# Patient Record
Sex: Female | Born: 1980 | Race: Black or African American | Hispanic: No | Marital: Single | Smoking: Current every day smoker
Health system: Southern US, Community
[De-identification: ages and names within clinical notes are randomized; demographics above are authoritative.]

## PROBLEM LIST (undated history)

## (undated) DIAGNOSIS — I10 Essential (primary) hypertension: Secondary | ICD-10-CM

## (undated) DIAGNOSIS — E785 Hyperlipidemia, unspecified: Secondary | ICD-10-CM

## (undated) DIAGNOSIS — E119 Type 2 diabetes mellitus without complications: Secondary | ICD-10-CM

## (undated) HISTORY — DX: Hyperlipidemia, unspecified: E78.5

---

## 2005-11-26 ENCOUNTER — Emergency Department (HOSPITAL_COMMUNITY): Admission: EM | Admit: 2005-11-26 | Discharge: 2005-11-26 | Payer: Self-pay | Admitting: Emergency Medicine

## 2007-01-06 ENCOUNTER — Emergency Department (HOSPITAL_COMMUNITY): Admission: EM | Admit: 2007-01-06 | Discharge: 2007-01-06 | Payer: Self-pay | Admitting: Emergency Medicine

## 2007-09-21 ENCOUNTER — Ambulatory Visit (HOSPITAL_COMMUNITY): Admission: RE | Admit: 2007-09-21 | Discharge: 2007-09-21 | Payer: Self-pay | Admitting: Obstetrics

## 2008-02-05 ENCOUNTER — Inpatient Hospital Stay (HOSPITAL_COMMUNITY): Admission: AD | Admit: 2008-02-05 | Discharge: 2008-02-05 | Payer: Self-pay | Admitting: Obstetrics & Gynecology

## 2008-02-09 ENCOUNTER — Inpatient Hospital Stay (HOSPITAL_COMMUNITY): Admission: AD | Admit: 2008-02-09 | Discharge: 2008-02-11 | Payer: Self-pay | Admitting: Obstetrics & Gynecology

## 2009-02-24 ENCOUNTER — Ambulatory Visit: Payer: Self-pay | Admitting: Advanced Practice Midwife

## 2009-02-24 ENCOUNTER — Inpatient Hospital Stay (HOSPITAL_COMMUNITY): Admission: AD | Admit: 2009-02-24 | Discharge: 2009-02-26 | Payer: Self-pay | Admitting: Obstetrics & Gynecology

## 2009-08-15 ENCOUNTER — Inpatient Hospital Stay (HOSPITAL_COMMUNITY): Admission: AD | Admit: 2009-08-15 | Discharge: 2009-08-15 | Payer: Self-pay | Admitting: Obstetrics & Gynecology

## 2009-12-23 ENCOUNTER — Other Ambulatory Visit: Admission: RE | Admit: 2009-12-23 | Discharge: 2009-12-23 | Payer: Self-pay | Admitting: Obstetrics and Gynecology

## 2010-01-12 ENCOUNTER — Inpatient Hospital Stay (HOSPITAL_COMMUNITY): Admission: AD | Admit: 2010-01-12 | Discharge: 2010-01-14 | Payer: Self-pay | Admitting: Obstetrics & Gynecology

## 2010-01-12 ENCOUNTER — Ambulatory Visit: Payer: Self-pay | Admitting: Advanced Practice Midwife

## 2010-06-22 ENCOUNTER — Encounter: Payer: Self-pay | Admitting: Obstetrics

## 2010-08-15 LAB — CBC
HCT: 32.5 % — ABNORMAL LOW (ref 36.0–46.0)
Hemoglobin: 10.6 g/dL — ABNORMAL LOW (ref 12.0–15.0)
MCH: 28.7 pg (ref 26.0–34.0)
MCHC: 32.6 g/dL (ref 30.0–36.0)
MCV: 87.9 fL (ref 78.0–100.0)
Platelets: 220 10*3/uL (ref 150–400)
RBC: 3.69 MIL/uL — ABNORMAL LOW (ref 3.87–5.11)

## 2010-08-15 LAB — RAPID URINE DRUG SCREEN, HOSP PERFORMED
Amphetamines: NOT DETECTED
Benzodiazepines: NOT DETECTED
Cocaine: NOT DETECTED
Tetrahydrocannabinol: NOT DETECTED

## 2010-08-24 LAB — COMPREHENSIVE METABOLIC PANEL
CO2: 26 mEq/L (ref 19–32)
Calcium: 9.3 mg/dL (ref 8.4–10.5)
Chloride: 103 mEq/L (ref 96–112)
Glucose, Bld: 116 mg/dL — ABNORMAL HIGH (ref 70–99)
Sodium: 136 mEq/L (ref 135–145)
Total Protein: 6.4 g/dL (ref 6.0–8.3)

## 2010-08-24 LAB — WET PREP, GENITAL
Clue Cells Wet Prep HPF POC: NONE SEEN
Trich, Wet Prep: NONE SEEN
Yeast Wet Prep HPF POC: NONE SEEN

## 2010-08-24 LAB — URINALYSIS, ROUTINE W REFLEX MICROSCOPIC
Bilirubin Urine: NEGATIVE
Specific Gravity, Urine: 1.01 (ref 1.005–1.030)
pH: 7 (ref 5.0–8.0)

## 2010-08-24 LAB — GC/CHLAMYDIA PROBE AMP, GENITAL
Chlamydia, DNA Probe: NEGATIVE
GC Probe Amp, Genital: NEGATIVE

## 2010-08-24 LAB — URINE MICROSCOPIC-ADD ON

## 2010-08-24 LAB — CBC
Hemoglobin: 11.9 g/dL — ABNORMAL LOW (ref 12.0–15.0)
RBC: 4.03 MIL/uL (ref 3.87–5.11)

## 2010-08-24 LAB — POCT PREGNANCY, URINE: Preg Test, Ur: POSITIVE

## 2010-09-05 LAB — COMPREHENSIVE METABOLIC PANEL
Albumin: 2.7 g/dL — ABNORMAL LOW (ref 3.5–5.2)
CO2: 22 mEq/L (ref 19–32)
Calcium: 8.7 mg/dL (ref 8.4–10.5)
Chloride: 108 mEq/L (ref 96–112)
Total Bilirubin: 0.8 mg/dL (ref 0.3–1.2)
Total Protein: 6.4 g/dL (ref 6.0–8.3)

## 2010-09-05 LAB — CBC
HCT: 35 % — ABNORMAL LOW (ref 36.0–46.0)
Hemoglobin: 11.6 g/dL — ABNORMAL LOW (ref 12.0–15.0)
MCHC: 33.1 g/dL (ref 30.0–36.0)
MCV: 88.2 fL (ref 78.0–100.0)
Platelets: 232 10*3/uL (ref 150–400)
RBC: 3.97 MIL/uL (ref 3.87–5.11)
RDW: 16.1 % — ABNORMAL HIGH (ref 11.5–15.5)
WBC: 14.4 10*3/uL — ABNORMAL HIGH (ref 4.0–10.5)

## 2010-09-05 LAB — URIC ACID: Uric Acid, Serum: 4.5 mg/dL (ref 2.4–7.0)

## 2010-09-05 LAB — SYPHILIS: RPR W/REFLEX TO RPR TITER AND TREPONEMAL ANTIBODIES, TRADITIONAL SCREENING AND DIAGNOSIS ALGORITHM: RPR Ser Ql: NONREACTIVE

## 2011-03-04 LAB — CBC
HCT: 34.2 — ABNORMAL LOW
MCHC: 32.2
MCHC: 32.3
MCV: 85
Platelets: 284
RBC: 3.87
RBC: 4.01
RDW: 15.7 — ABNORMAL HIGH
WBC: 11.2 — ABNORMAL HIGH

## 2011-03-04 LAB — URINALYSIS, ROUTINE W REFLEX MICROSCOPIC
Bilirubin Urine: NEGATIVE
Glucose, UA: NEGATIVE
Hgb urine dipstick: NEGATIVE
Specific Gravity, Urine: <1.005 — ABNORMAL LOW
Urobilinogen, UA: 0.2
pH: 5.5

## 2011-03-04 LAB — COMPREHENSIVE METABOLIC PANEL
Albumin: 2.6 — ABNORMAL LOW
Alkaline Phosphatase: 103
BUN: 4 — ABNORMAL LOW
Potassium: 3.6
Sodium: 137
Total Protein: 6.2

## 2011-03-04 LAB — URINE MICROSCOPIC-ADD ON: RBC / HPF: NONE SEEN

## 2011-03-04 LAB — RPR: RPR Ser Ql: NONREACTIVE

## 2011-08-10 ENCOUNTER — Emergency Department (HOSPITAL_COMMUNITY)
Admission: EM | Admit: 2011-08-10 | Discharge: 2011-08-11 | Disposition: A | Discharge status: Home / Self Care | Payer: Self-pay | Attending: Emergency Medicine | Admitting: Emergency Medicine

## 2011-08-10 ENCOUNTER — Encounter (HOSPITAL_COMMUNITY): Payer: Self-pay | Admitting: *Deleted

## 2011-08-10 DIAGNOSIS — F17200 Nicotine dependence, unspecified, uncomplicated: Secondary | ICD-10-CM | POA: Insufficient documentation

## 2011-08-10 DIAGNOSIS — I517 Cardiomegaly: Secondary | ICD-10-CM | POA: Insufficient documentation

## 2011-08-10 DIAGNOSIS — J111 Influenza due to unidentified influenza virus with other respiratory manifestations: Secondary | ICD-10-CM | POA: Insufficient documentation

## 2011-08-10 MED ORDER — ACETAMINOPHEN 325 MG PO TABS
650.0000 mg | ORAL_TABLET | Freq: Once | ORAL | Status: AC
  Administered 2011-08-10: 650 mg via ORAL
  Filled 2011-08-10: qty 2

## 2011-08-10 NOTE — ED Notes (Signed)
The pt has had a cold  Coughing temp and aching all over since yesterday.  No med taken for temp

## 2011-08-11 ENCOUNTER — Emergency Department (HOSPITAL_COMMUNITY): Payer: Self-pay

## 2011-08-11 MED ORDER — AZITHROMYCIN 250 MG PO TABS: ORAL_TABLET | ORAL | Status: AC

## 2011-08-11 NOTE — ED Provider Notes (Signed)
History     CSN: [id number]  Arrival date & time 08/10/11  1943   First MD Initiated Contact with Patient 08/11/11 0005      Chief Complaint  Patient presents with  . Influenza     HPI  History provided by the patient. Patient is a 31 year old female with no significant past medical history who presents with complaints of acute onset fever, chills, bodyaches and cough that began yesterday. Patient reports increased fatigue all day long. Patient did not take any medications to treat her symptoms. She denies any associated vomiting or diarrhea. Concerns are described as moderate. She denies any aggravating or alleviating factors. Patient is unsure of any known sick contacts. She has not had any recent travel. Patient denies any dysuria, urinary frequency or hematuria.    History reviewed. No pertinent past medical history.  History reviewed. No pertinent past surgical history.  History reviewed. No pertinent family history.  History  Substance Use Topics  . Smoking status: Current Everyday Smoker  . Smokeless tobacco: Not on file  . Alcohol Use: No    OB History    Grav Para Term Preterm Abortions TAB SAB Ect Mult Living                  Review of Systems  Constitutional: Positive for fever, chills, appetite change and fatigue.  HENT: Positive for rhinorrhea. Negative for ear pain, congestion and sore throat.   Respiratory: Positive for cough.   Genitourinary: Negative for dysuria, frequency, hematuria, flank pain, vaginal bleeding and vaginal discharge.  Musculoskeletal: Positive for myalgias.  All other systems reviewed and are negative.    Allergies  Review of patient's allergies indicates no known allergies.  Home Medications  No current outpatient prescriptions on file.  BP 143/95  Pulse 107  Temp(Src) 98.7 F (37.1 C) (Oral)  Resp 20  SpO2 99%  LMP 07/27/2011  Physical Exam  Nursing note and vitals reviewed. Constitutional: She is oriented to  person, place, and time. She appears well-developed and well-nourished. No distress.  HENT:  Head: Normocephalic and atraumatic.  Right Ear: Tympanic membrane normal.  Left Ear: Tympanic membrane normal.  Mouth/Throat: Oropharynx is clear and moist.  Neck: Normal range of motion. Neck supple.       No meningeal signs  Cardiovascular: Normal rate and regular rhythm.   Pulmonary/Chest: Effort normal and breath sounds normal. No respiratory distress. She has no wheezes. She has no rales.  Abdominal: Soft. She exhibits no distension. There is no tenderness. There is no rebound and no guarding.  Lymphadenopathy:    She has no cervical adenopathy.  Neurological: She is alert and oriented to person, place, and time.  Skin: Skin is warm and dry. No rash noted.  Psychiatric: She has a normal mood and affect. Her behavior is normal.    ED Course  Procedures   Dg Chest 2 View  08/11/2011  *RADIOLOGY REPORT*  Clinical Data: Influenza  CHEST - 2 VIEW  Comparison: None.  Findings: The heart is mildly enlarged.  Lungs are clear.  No pneumothorax  IMPRESSION: Cardiomegaly without edema.  Original Report Authenticated By: Donavan Burnet, M.D.     1. Influenza       MDM  12:30AM patient seen and evaluated. Patient in no acute distress. Patient febrile with slight tachycardia.  Chest x-ray normal. Patient temperature improved after medications. Patient reports feeling better with no bodyaches at this time. She still has slight dry cough. Patient with no other  concerning symptoms and normal exam. Suspect viral infection.      Angus Seller, [place] 08/12/11 [phone]

## 2011-08-11 NOTE — Discharge Instructions (Signed)
Your chest x-ray today has not shown any concerning signs for pneumonia infection. You begin a prescription for medications to help with your infection and symptoms. Please take this as prescribed for the full length of time. Please followup with the primary care provider.  Cough, Adult  A cough is a reflex that helps clear your throat and airways. It can help heal the body or may be a reaction to an irritated airway. A cough may only last 2 or 3 weeks (acute) or may last more than 8 weeks (chronic).  CAUSES Acute cough:  Viral or bacterial infections.  Chronic cough:  Infections.   Allergies.   Asthma.   Post-nasal drip.   Smoking.   Heartburn or acid reflux.   Some medicines.   Chronic lung problems (COPD).   Cancer.  SYMPTOMS   Cough.   Fever.   Chest pain.   Increased breathing rate.   High-pitched whistling sound when breathing (wheezing).   Colored mucus that you cough up (sputum).  TREATMENT   A bacterial cough may be treated with antibiotic medicine.   A viral cough must run its course and will not respond to antibiotics.   Your caregiver may recommend other treatments if you have a chronic cough.  HOME CARE INSTRUCTIONS   Only take over-the-counter or prescription medicines for pain, discomfort, or fever as directed by your caregiver. Use cough suppressants only as directed by your caregiver.   Use a cold steam vaporizer or humidifier in your bedroom or home to help loosen secretions.   Sleep in a semi-upright position if your cough is worse at night.   Rest as needed.   Stop smoking if you smoke.  SEEK IMMEDIATE MEDICAL CARE IF:   You have pus in your sputum.   Your cough starts to worsen.   You cannot control your cough with suppressants and are losing sleep.   You begin coughing up blood.   You have difficulty breathing.   You develop pain which is getting worse or is uncontrolled with medicine.   You have a fever.  MAKE SURE YOU:     Understand these instructions.   Will watch your condition.   Will get help right away if you are not doing well or get worse.  Document Released: 11/14/2010 Document Revised: 05/07/2011 Document Reviewed: 11/14/2010 [facility] Patient Information 2012 [place], [place].     Fever  Fever is a higher-than-normal body temperature. A normal temperature varies with:  Age.   How it is measured (mouth, underarm, rectal, or ear).   Time of day.  In an adult, an oral temperature around 98.6 Fahrenheit (F) or 37 Celsius (C) is considered normal. A rise in temperature of about 1.8 F or 1 C is generally considered a fever (100.4 F or 38 C). In an infant age 49 days or less, a rectal temperature of 100.4 F (38 C) generally is regarded as fever. Fever is not a disease but can be a symptom of illness. CAUSES   Fever is most commonly caused by infection.   Some non-infectious problems can cause fever. For example:   Some arthritis problems.   Problems with the thyroid or adrenal glands.   Immune system problems.   Some kinds of cancer.   A reaction to certain medicines.   Occasionally, the source of a fever cannot be determined. This is sometimes called a "Fever of Unknown Origin" (FUO).   Some situations may lead to a temporary rise in body temperature that  may go away on its own. Examples are:   Childbirth.   Surgery.   Some situations may cause a rise in body temperature but these are not considered "true fever". Examples are:   Intense exercise.   Dehydration.   Exposure to high outside or room temperatures.  SYMPTOMS   Feeling warm or hot.   Fatigue or feeling exhausted.   Aching all over.   Chills.   Shivering.   Sweats.  DIAGNOSIS  A fever can be suspected by your caregiver feeling that your skin is unusually warm. The fever is confirmed by taking a temperature with a thermometer. Temperatures can be taken different ways. Some methods are accurate and  some are not: With adults, adolescents, and children:   An oral temperature is used most commonly.   An ear thermometer will only be accurate if it is positioned as recommended by the manufacturer.   Under the arm temperatures are not accurate and not recommended.   Most electronic thermometers are fast and accurate.  Infants and Toddlers:  Rectal temperatures are recommended and most accurate.   Ear temperatures are not accurate in this age group and are not recommended.   Skin thermometers are not accurate.  RISKS AND COMPLICATIONS   During a fever, the body uses more oxygen, so a person with a fever may develop rapid breathing or shortness of breath. This can be dangerous especially in people with heart or lung disease.   The sweats that occur following a fever can cause dehydration.   High fever can cause seizures in infants and children.   Older persons can develop confusion during a fever.  TREATMENT   Medications may be used to control temperature.   Do not give aspirin to children with fevers. There is an association with Reye's syndrome. Reye's syndrome is a rare but potentially deadly disease.   If an infection is present and medications have been prescribed, take them as directed. Finish the full course of medications until they are gone.   Sponging or bathing with room-temperature water may help reduce body temperature. Do not use ice water or alcohol sponge baths.   Do not over-bundle children in blankets or heavy clothes.   Drinking adequate fluids during an illness with fever is important to prevent dehydration.  HOME CARE INSTRUCTIONS   For adults, rest and adequate fluid intake are important. Dress according to how you feel, but do not over-bundle.   Drink enough water and/or fluids to keep your urine clear or pale yellow.   For infants over 3 months and children, giving medication as directed by your caregiver to control fever can help with comfort. The  amount to be given is based on the child's weight. Do NOT give more than is recommended.  SEEK MEDICAL CARE IF:   You or your child are unable to keep fluids down.   Vomiting or diarrhea develops.   You develop a skin rash.   An oral temperature above 102 F (38.9 C) develops, or a fever which persists for over 3 days.   You develop excessive weakness, dizziness, fainting or extreme thirst.   Fevers keep coming back after 3 days.  SEEK IMMEDIATE MEDICAL CARE IF:   Shortness of breath or trouble breathing develops   You pass out.   You feel you are making little or no urine.   New pain develops that was not there before (such as in the head, neck, chest, back, or abdomen).   You cannot  hold down fluids.   Vomiting and diarrhea persist for more than a day or two.   You develop a stiff neck and/or your eyes become sensitive to light.   An unexplained temperature above 102 F (38.9 C) develops.  Document Released: 05/18/2005 Document Revised: 05/07/2011 Document Reviewed: 05/03/2008 [facility] Patient Information 2012 [place], [place].

## 2011-08-12 NOTE — ED Provider Notes (Signed)
Medical screening examination/treatment/procedure(s) were performed by non-physician practitioner and as supervising physician I was immediately available for consultation/collaboration.  Brannan Cassedy, MD 08/12/11 0734 

## 2013-03-16 ENCOUNTER — Emergency Department (HOSPITAL_COMMUNITY)
Admission: EM | Admit: 2013-03-16 | Discharge: 2013-03-16 | Disposition: A | Discharge status: Home / Self Care | Payer: Self-pay | Attending: Emergency Medicine | Admitting: Emergency Medicine

## 2013-03-16 ENCOUNTER — Encounter (HOSPITAL_COMMUNITY): Payer: Self-pay | Admitting: Emergency Medicine

## 2013-03-16 DIAGNOSIS — Y92410 Unspecified street and highway as the place of occurrence of the external cause: Secondary | ICD-10-CM | POA: Insufficient documentation

## 2013-03-16 DIAGNOSIS — F17200 Nicotine dependence, unspecified, uncomplicated: Secondary | ICD-10-CM | POA: Insufficient documentation

## 2013-03-16 DIAGNOSIS — S335XXA Sprain of ligaments of lumbar spine, initial encounter: Secondary | ICD-10-CM | POA: Insufficient documentation

## 2013-03-16 DIAGNOSIS — S39012A Strain of muscle, fascia and tendon of lower back, initial encounter: Secondary | ICD-10-CM

## 2013-03-16 DIAGNOSIS — Y9389 Activity, other specified: Secondary | ICD-10-CM | POA: Insufficient documentation

## 2013-03-16 DIAGNOSIS — R404 Transient alteration of awareness: Secondary | ICD-10-CM | POA: Insufficient documentation

## 2013-03-16 MED ORDER — MELOXICAM 7.5 MG PO TABS: 7.5000 mg | ORAL_TABLET | Freq: Every day | ORAL | Status: DC
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
## 2013-03-17 NOTE — Progress Notes (Signed)
ED/CM noted patient did not have health insurance and/or PCP listed in the computer.  Patient was given the [place] resource handout with information on the clinics, food pantries, and the handout for new health insurance sign-up.  Patient expressed appreciation for this. 

## 2013-03-23 NOTE — ED Provider Notes (Signed)
Medical screening examination/treatment/procedure(s) were performed by non-physician practitioner and as supervising physician I was immediately available for consultation/collaboration.    Devoria Albe, MD, Armando Gang   Ward Givens, MD 03/23/13 [phone]

## 2013-05-30 ENCOUNTER — Encounter (HOSPITAL_COMMUNITY): Payer: Self-pay | Admitting: Emergency Medicine

## 2013-05-30 ENCOUNTER — Emergency Department (HOSPITAL_COMMUNITY)
Admission: EM | Admit: 2013-05-30 | Discharge: 2013-05-30 | Disposition: A | Discharge status: Home / Self Care | Payer: Self-pay | Attending: Emergency Medicine | Admitting: Emergency Medicine

## 2013-05-30 DIAGNOSIS — Z791 Long term (current) use of non-steroidal anti-inflammatories (NSAID): Secondary | ICD-10-CM | POA: Insufficient documentation

## 2013-05-30 DIAGNOSIS — F17200 Nicotine dependence, unspecified, uncomplicated: Secondary | ICD-10-CM | POA: Insufficient documentation

## 2013-05-30 DIAGNOSIS — I10 Essential (primary) hypertension: Secondary | ICD-10-CM | POA: Insufficient documentation

## 2013-05-30 DIAGNOSIS — R42 Dizziness and giddiness: Secondary | ICD-10-CM | POA: Insufficient documentation

## 2013-05-30 DIAGNOSIS — R51 Headache: Secondary | ICD-10-CM | POA: Insufficient documentation

## 2013-05-30 HISTORY — DX: Essential (primary) hypertension: I10

## 2013-05-30 MED ORDER — CLONIDINE HCL 0.1 MG PO TABS
0.1000 mg | ORAL_TABLET | Freq: Once | ORAL | Status: AC
  Administered 2013-05-30: 0.1 mg via ORAL
  Filled 2013-05-30: qty 1

## 2013-05-30 MED ORDER — HYDROCHLOROTHIAZIDE 25 MG PO TABS: 25.0000 mg | ORAL_TABLET | Freq: Every day | ORAL | Status: DC

## 2013-05-30 MED ORDER — MECLIZINE HCL 12.5 MG PO TABS
25.0000 mg | ORAL_TABLET | Freq: Once | ORAL | Status: AC
  Administered 2013-05-30: 25 mg via ORAL
  Filled 2013-05-30: qty 2

## 2013-05-30 NOTE — ED Provider Notes (Signed)
CSN: [id number]     Arrival date & time 05/30/13  1113 History  This chart was scribed for Donnetta Hutching, MD by Dorothey Baseman, ED Scribe. This patient was seen in room APA18/APA18 and the patient's care was started at 1:56 PM.    Chief Complaint  Patient presents with  . Dizziness  . Hypertension   The history is provided by the patient. No language interpreter was used.   HPI Comments: Samantha [name] is a 32 y.o. Female with a history of pregnancy-induced HTN who presents to the Emergency Department complaining of a mild headache with intermittent dizziness onset 2 days ago. She reports that she had her blood pressure checked at her work 2 days ago and it was 173/96, then 181/120 measured at home earlier today, and 158/106 measured upon arrival to the ED. Patient reports that she has been eating slightly more salt recently than usual. She states that she does not take any medications for her HTN. Patient is a current every day smoker, 0.5 PPD.  Past Medical History  Diagnosis Date  . Hypertension    History reviewed. No pertinent past surgical history. Family History  Problem Relation Age of Onset  . Cancer Father    History  Substance Use Topics  . Smoking status: Current Every Day Smoker -- 0.50 packs/day for .4 years    Types: Cigarettes  . Smokeless tobacco: Never Used  . Alcohol Use: No   OB History   Grav Para Term Preterm Abortions TAB SAB Ect Mult Living   9 7 7  1  1         Review of Systems  A complete 10 system review of systems was obtained and all systems are negative except as noted in the HPI and PMH.   Allergies  Review of patient's allergies indicates no known allergies.  Home Medications   Current Outpatient Rx  Name  Route  Sig  Dispense  Refill  . meloxicam (MOBIC) 7.5 MG tablet   Oral   Take 1 tablet (7.5 mg total) by mouth daily.   10 tablet   0    Triage Vitals: BP 158/106  Pulse 83  Temp(Src) 98.2 F (36.8 C) (Oral)  Resp 16  Ht 5\' 2"   (1.575 m)  Wt 200 lb (90.719 kg)  BMI 36.57 kg/m2  SpO2 100%  LMP 02/28/2013  Physical Exam  Nursing note and vitals reviewed. Constitutional: She is oriented to person, place, and time. She appears well-developed and well-nourished. No distress.  HENT:  Head: Normocephalic and atraumatic.  Right Ear: Hearing, tympanic membrane, external ear and ear canal normal.  Left Ear: Hearing, tympanic membrane, external ear and ear canal normal.  Eyes: Conjunctivae are normal.  Neck: Normal range of motion. Neck supple.  Cardiovascular: Normal rate, regular rhythm and normal heart sounds.   Pulmonary/Chest: Effort normal and breath sounds normal. No respiratory distress.  Abdominal: She exhibits no distension.  Musculoskeletal: Normal range of motion.  Able to move all 4 extremities without difficulty.  Neurological: She is alert and oriented to person, place, and time.  Skin: Skin is warm and dry.  Psychiatric: She has a normal mood and affect. Her behavior is normal.    ED Course  Procedures (including critical care time)  DIAGNOSTIC STUDIES: Oxygen Saturation is 100% on room air, normal by my interpretation.    COORDINATION OF CARE: 2:02 PM- Will order Catapres and Antivert to manage symptoms. Discussed treatment plan with patient at bedside and patient verbalized  agreement.     Labs Review Labs Reviewed - No data to display Imaging Review No results found.  EKG Interpretation   None       MDM  No diagnosis found. Patient has past history of pregnancy-induced hypertension. Blood pressure has been consistently high. We'll start hydrochlorothiazide. Discussed need for primary care followup.  I personally performed the services described in this documentation, which was scribed in my presence. The recorded information has been reviewed and is accurate.     Donnetta Hutching, MD 05/30/13 [phone]

## 2013-05-30 NOTE — ED Notes (Signed)
Pt states she has not been taking her BP for months because it makes her feel bad. Has RX for methyldopa 250 mg that was filled on 05/30/13. Pt states she took one before coming to the ED

## 2013-05-30 NOTE — ED Notes (Signed)
Dizziness x 2 days with htn.  Reports is supposed to take medicine for htn but has not been taking x 5 months because she felt fine.

## 2013-09-13 ENCOUNTER — Emergency Department (HOSPITAL_COMMUNITY): Payer: No Typology Code available for payment source

## 2013-09-13 ENCOUNTER — Emergency Department (HOSPITAL_COMMUNITY)
Admission: EM | Admit: 2013-09-13 | Discharge: 2013-09-14 | Disposition: A | Discharge status: Home / Self Care | Payer: No Typology Code available for payment source | Attending: Emergency Medicine | Admitting: Emergency Medicine

## 2013-09-13 ENCOUNTER — Encounter (HOSPITAL_COMMUNITY): Payer: Self-pay | Admitting: Emergency Medicine

## 2013-09-13 DIAGNOSIS — Z79899 Other long term (current) drug therapy: Secondary | ICD-10-CM | POA: Insufficient documentation

## 2013-09-13 DIAGNOSIS — Y92410 Unspecified street and highway as the place of occurrence of the external cause: Secondary | ICD-10-CM | POA: Insufficient documentation

## 2013-09-13 DIAGNOSIS — I10 Essential (primary) hypertension: Secondary | ICD-10-CM | POA: Insufficient documentation

## 2013-09-13 DIAGNOSIS — R0602 Shortness of breath: Secondary | ICD-10-CM | POA: Insufficient documentation

## 2013-09-13 DIAGNOSIS — F17200 Nicotine dependence, unspecified, uncomplicated: Secondary | ICD-10-CM | POA: Insufficient documentation

## 2013-09-13 DIAGNOSIS — R0789 Other chest pain: Secondary | ICD-10-CM

## 2013-09-13 DIAGNOSIS — Y9389 Activity, other specified: Secondary | ICD-10-CM | POA: Insufficient documentation

## 2013-09-13 DIAGNOSIS — S298XXA Other specified injuries of thorax, initial encounter: Secondary | ICD-10-CM | POA: Insufficient documentation

## 2013-09-13 NOTE — ED Notes (Signed)
Pt waiting for a disposition.
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
## 2013-09-13 NOTE — ED Notes (Signed)
PT IN PEDS WAITING ROOM WITH CHILD
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
## 2013-09-13 NOTE — ED Notes (Signed)
To x-ray

## 2013-09-14 MED ORDER — OXYCODONE-ACETAMINOPHEN 5-325 MG PO TABS: 1.0000 | ORAL_TABLET | ORAL | Status: DC | PRN

## 2013-09-14 NOTE — ED Provider Notes (Signed)
I saw and evaluated the patient, reviewed the resident's note and I agree with the findings and plan.   EKG Interpretation None      See other note.   Virgel Manifold, MD 09/14/13 1425
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
## 2013-09-14 NOTE — Discharge Instructions (Signed)

## 2014-04-02 ENCOUNTER — Encounter (HOSPITAL_COMMUNITY): Payer: Self-pay | Admitting: Emergency Medicine

## 2014-08-22 ENCOUNTER — Ambulatory Visit: Payer: Self-pay | Admitting: Internal Medicine

## 2014-11-04 ENCOUNTER — Emergency Department (INDEPENDENT_AMBULATORY_CARE_PROVIDER_SITE_OTHER)
Admission: EM | Admit: 2014-11-04 | Discharge: 2014-11-04 | Disposition: A | Discharge status: Home / Self Care | Payer: Medicaid Other | Source: Home / Self Care | Attending: Family Medicine | Admitting: Family Medicine

## 2014-11-04 ENCOUNTER — Encounter (HOSPITAL_COMMUNITY): Payer: Self-pay | Admitting: Emergency Medicine

## 2014-11-04 DIAGNOSIS — R42 Dizziness and giddiness: Secondary | ICD-10-CM

## 2014-11-04 DIAGNOSIS — I10 Essential (primary) hypertension: Secondary | ICD-10-CM | POA: Diagnosis present

## 2014-11-04 MED ORDER — AMLODIPINE BESYLATE 10 MG PO TABS: 10.0000 mg | ORAL_TABLET | Freq: Every day | ORAL | Status: DC

## 2014-11-04 NOTE — ED Provider Notes (Signed)
Samantha [name] is a 34 y.o. female who presents to Urgent Care today for lightheadedness. Patient has hypertension and has been out of her amlodipine blood pressure medicine for around a month now. She has Medicaid but is having trouble schedule an appointment with her primary care provider [facility].  The past 2 weeks she felt a bit lightheaded. She denies any syncope vision changes difficulty balancing her vertigo. She had a mild headache today but that was relieved with ibuprofen. She denies any chest pains or palpitations or shortness of breath. She feels well otherwise.   Past Medical History  Diagnosis Date  . Hypertension    History reviewed. No pertinent past surgical history. History  Substance Use Topics  . Smoking status: Current Every Day Smoker -- 0.50 packs/day for .4 years    Types: Cigarettes  . Smokeless tobacco: Never Used  . Alcohol Use: No   ROS as above Medications: No current facility-administered medications for this encounter.   Current Outpatient Prescriptions  Medication Sig Dispense Refill  . amLODipine (NORVASC) 10 MG tablet Take 1 tablet (10 mg total) by mouth daily. 30 tablet 1  . [DISCONTINUED] methyldopa (ALDOMET) 250 MG tablet Take 250 mg by mouth 2 (two) times daily.     No Known Allergies   Exam:  BP 177/96 mmHg  Pulse 64  Temp(Src) 97.9 F (36.6 C) (Oral)  Resp 14  SpO2 98% Gen: Well NAD HEENT: EOMI,  MMM PERRLA Lungs: Normal work of breathing. CTABL Heart: RRR no MRG Abd: NABS, Soft. Nondistended, Nontender Exts: Brisk capillary refill, warm and well perfused.  Neuro: Alert and oriented normal coordination balance strength and sensation. Normal gait.  No results found for this or any previous visit (from the past 24 hour(s)). No results found.  Assessment and Plan: 34 y.o. female with hypertension and mild lightheadedness. I suspect her symptoms are related to her underlying elevated  blood pressure. Symptoms are ongoing and not acute. Restart amlodipine and follow-up with PCP. Return as needed.  Discussed warning signs or symptoms. Please see discharge instructions. Patient expresses understanding.     Gregor Hams, MD 11/04/14 [phone]

## 2014-11-04 NOTE — Discharge Instructions (Signed)
Thank you for coming in today. Call or go to the emergency room if you get worse, have trouble breathing, have chest pains, or palpitations.   Please call or see Ms Cheryle Horsfall for assistance with your bill.  You may qualify for reduced or free services.  Her phone number is [phone]. Her email is [email or web address]   Dizziness  Dizziness means you feel unsteady or lightheaded. You might feel like you are going to pass out (faint). HOME CARE   Drink enough fluids to keep your pee (urine) clear or pale yellow.  Take your medicines exactly as told by your doctor. If you take blood pressure medicine, always stand up slowly from the lying or sitting position. Hold on to something to steady yourself.  If you need to stand in one place for a long time, move your legs often. Tighten and relax your leg muscles.  Have someone stay with you until you feel okay.  Do not drive or use heavy machinery if you feel dizzy.  Do not drink alcohol. GET HELP RIGHT AWAY IF:   You feel dizzy or lightheaded and it gets worse.  You feel sick to your stomach (nauseous), or you throw up (vomit).  You have trouble talking or walking.  You feel weak or have trouble using your arms, hands, or legs.  You cannot think clearly or have trouble forming sentences.  You have chest pain, belly (abdominal) pain, sweating, or you are short of breath.  Your vision changes.  You are bleeding.  You have problems from your medicine that seem to be getting worse. MAKE SURE YOU:   Understand these instructions.  Will watch your condition.  Will get help right away if you are not doing well or get worse. Document Released: 05/07/2011 Document Revised: 08/10/2011 Document Reviewed: 05/07/2011 [facility] Patient Information 2015 [place], [place]. This information is not intended to replace advice given to you by your health care provider. Make sure you discuss any questions you have with your  health care provider.  Hypertension Hypertension, commonly called high blood pressure, is when the force of blood pumping through your arteries is too strong. Your arteries are the blood vessels that carry blood from your heart throughout your body. A blood pressure reading consists of a higher number over a lower number, such as 110/72. The higher number (systolic) is the pressure inside your arteries when your heart pumps. The lower number (diastolic) is the pressure inside your arteries when your heart relaxes. Ideally you want your blood pressure below 120/80. Hypertension forces your heart to work harder to pump blood. Your arteries may become narrow or stiff. Having hypertension puts you at risk for heart disease, stroke, and other problems.  RISK FACTORS Some risk factors for high blood pressure are controllable. Others are not.  Risk factors you cannot control include:   Race. You may be at higher risk if you are African American.  Age. Risk increases with age.  Gender. Men are at higher risk than women before age 13 years. After age 45, women are at higher risk than men. Risk factors you can control include:  Not getting enough exercise or physical activity.  Being overweight.  Getting too much fat, sugar, calories, or salt in your diet.  Drinking too much alcohol. SIGNS AND SYMPTOMS Hypertension does not usually cause signs or symptoms. Extremely high blood pressure (hypertensive crisis) may cause headache, anxiety, shortness of breath, and nosebleed. DIAGNOSIS  To check if you have hypertension, your health  care provider will measure your blood pressure while you are seated, with your arm held at the level of your heart. It should be measured at least twice using the same arm. Certain conditions can cause a difference in blood pressure between your right and left arms. A blood pressure reading that is higher than normal on one occasion does not mean that you need treatment. If  one blood pressure reading is high, ask your health care provider about having it checked again. TREATMENT  Treating high blood pressure includes making lifestyle changes and possibly taking medicine. Living a healthy lifestyle can help lower high blood pressure. You may need to change some of your habits. Lifestyle changes may include:  Following the DASH diet. This diet is high in fruits, vegetables, and whole grains. It is low in salt, red meat, and added sugars.  Getting at least 2 hours of brisk physical activity every week.  Losing weight if necessary.  Not smoking.  Limiting alcoholic beverages.  Learning ways to reduce stress. If lifestyle changes are not enough to get your blood pressure under control, your health care provider may prescribe medicine. You may need to take more than one. Work closely with your health care provider to understand the risks and benefits. HOME CARE INSTRUCTIONS  Have your blood pressure rechecked as directed by your health care provider.   Take medicines only as directed by your health care provider. Follow the directions carefully. Blood pressure medicines must be taken as prescribed. The medicine does not work as well when you skip doses. Skipping doses also puts you at risk for problems.   Do not smoke.   Monitor your blood pressure at home as directed by your health care provider. SEEK MEDICAL CARE IF:   You think you are having a reaction to medicines taken.  You have recurrent headaches or feel dizzy.  You have swelling in your ankles.  You have trouble with your vision. SEEK IMMEDIATE MEDICAL CARE IF:  You develop a severe headache or confusion.  You have unusual weakness, numbness, or feel faint.  You have severe chest or abdominal pain.  You vomit repeatedly.  You have trouble breathing. MAKE SURE YOU:   Understand these instructions.  Will watch your condition.  Will get help right away if you are not doing well  or get worse. Document Released: 05/18/2005 Document Revised: 10/02/2013 Document Reviewed: 03/10/2013 [facility] Patient Information 2015 [place], [place]. This information is not intended to replace advice given to you by your health care provider. Make sure you discuss any questions you have with your health care provider.

## 2014-11-04 NOTE — ED Notes (Signed)
Pt reports she has been out of her amlodipine 10mg  onset 1 month Has been feeling dizzy associated w/HA BP today is 177/96 Denies CP, weakness, SOB Alert, no signs of acute distress.

## 2015-01-22 ENCOUNTER — Encounter (HOSPITAL_COMMUNITY): Payer: Self-pay | Admitting: Emergency Medicine

## 2015-01-22 ENCOUNTER — Emergency Department (HOSPITAL_COMMUNITY): Payer: Medicaid Other

## 2015-01-22 DIAGNOSIS — Z79899 Other long term (current) drug therapy: Secondary | ICD-10-CM | POA: Insufficient documentation

## 2015-01-22 DIAGNOSIS — I10 Essential (primary) hypertension: Secondary | ICD-10-CM | POA: Insufficient documentation

## 2015-01-22 DIAGNOSIS — Z720 Tobacco use: Secondary | ICD-10-CM | POA: Diagnosis not present

## 2015-01-22 DIAGNOSIS — R0789 Other chest pain: Secondary | ICD-10-CM | POA: Insufficient documentation

## 2015-01-22 DIAGNOSIS — R079 Chest pain, unspecified: Secondary | ICD-10-CM | POA: Diagnosis present

## 2015-01-22 LAB — BASIC METABOLIC PANEL
Anion gap: 9 (ref 5–15)
BUN: 9 mg/dL (ref 6–20)
CHLORIDE: 107 mmol/L (ref 101–111)
CO2: 23 mmol/L (ref 22–32)
CREATININE: 0.77 mg/dL (ref 0.44–1.00)
Calcium: 9.2 mg/dL (ref 8.9–10.3)
GFR calc Af Amer: >60 mL/min (ref >60)
GFR calc non Af Amer: >60 mL/min (ref >60)
Glucose, Bld: 134 mg/dL — ABNORMAL HIGH (ref 65–99)
Potassium: 3.6 mmol/L (ref 3.5–5.1)
Sodium: 139 mmol/L (ref 135–145)

## 2015-01-22 LAB — CBC
HCT: 37.3 % (ref 36.0–46.0)
Hemoglobin: 12.7 g/dL (ref 12.0–15.0)
MCH: 28.5 pg (ref 26.0–34.0)
MCHC: 34 g/dL (ref 30.0–36.0)
MCV: 83.6 fL (ref 78.0–100.0)
PLATELETS: 288 10*3/uL (ref 150–400)
RBC: 4.46 MIL/uL (ref 3.87–5.11)
RDW: 13.6 % (ref 11.5–15.5)
WBC: 10.8 10*3/uL — ABNORMAL HIGH (ref 4.0–10.5)

## 2015-01-22 LAB — I-STAT TROPONIN, ED: Troponin i, poc: 0 ng/mL (ref 0.00–0.08)

## 2015-01-22 NOTE — ED Notes (Signed)
Pt reports L sided cp radiating into L shoulder and neck x 3 days with some sob and dizziness.

## 2015-01-23 ENCOUNTER — Emergency Department (HOSPITAL_COMMUNITY)
Admission: EM | Admit: 2015-01-23 | Discharge: 2015-01-23 | Disposition: A | Discharge status: Home / Self Care | Payer: Medicaid Other | Attending: Emergency Medicine | Admitting: Emergency Medicine

## 2015-01-23 DIAGNOSIS — R0789 Other chest pain: Secondary | ICD-10-CM

## 2015-01-23 MED ORDER — IBUPROFEN 800 MG PO TABS
800.0000 mg | ORAL_TABLET | Freq: Once | ORAL | Status: AC
  Administered 2015-01-23: 800 mg via ORAL
  Filled 2015-01-23: qty 1

## 2015-01-23 MED ORDER — IBUPROFEN 800 MG PO TABS: 800.0000 mg | ORAL_TABLET | Freq: Three times a day (TID) | ORAL | Status: DC | PRN

## 2015-01-23 MED ORDER — AMLODIPINE BESYLATE 10 MG PO TABS: 10.0000 mg | ORAL_TABLET | Freq: Every day | ORAL | Status: DC

## 2015-01-23 NOTE — ED Provider Notes (Signed)
This chart was scribed for [place], DO by Forrestine Him, ED Scribe. This patient was seen in room B17C/B17C and the patient's care was started 2:26 AM.   TIME SEEN: 2:26 AM   CHIEF COMPLAINT:  Chief Complaint  Patient presents with  . Chest Pain     HPI:  HPI Comments: Samantha [name] is a 34 y.o. female with a PMHx of HTN who presents to the Emergency Department complaining of intermittent, ongoing L sided chest pain that radiates to the L shoulder x 3 days. Pain is described as "someone sitting on my chest". Discomfort is made worse with movement. No alleviating factors at this time. She also reports mild shortness of breath and dizziness. No recent fever, chills, nausea, vomiting, or abdominal pain. Samantha [name] is prescribed Amlodipine but has run out of her prescription. No recent surgeries. No recent fracture. No recent hospitalization. No recent trauma. No recent long distance travel. Pt is currently uses NuvaRing.  Denies history of tobacco use. States she quit "a long time ago". Denies history of PE or DVT. No lower extremity swelling or pain. Denies fever or cough. No family history of premature CAD.  ROS: See HPI Constitutional: no fever  Eyes: no drainage  ENT: no runny nose   Cardiovascular:  Positive chest pain  Resp: Positive SOB  GI: no vomiting GU: no dysuria Integumentary: no rash  Allergy: no hives  Musculoskeletal: no leg swelling  Neurological: no slurred speech. Positive dizziness ROS otherwise negative  PAST MEDICAL HISTORY/PAST SURGICAL HISTORY:  Past Medical History  Diagnosis Date  . Hypertension     MEDICATIONS:  Prior to Admission medications   Medication Sig Start Date End Date Taking? Authorizing Provider  amLODipine (NORVASC) 10 MG tablet Take 1 tablet (10 mg total) by mouth daily. 11/04/14   Gregor Hams, MD    ALLERGIES:  No Known Allergies  SOCIAL HISTORY:  Social History  Substance Use Topics  . Smoking status: Current Every Day  Smoker -- 0.50 packs/day for .4 years    Types: Cigarettes  . Smokeless tobacco: Never Used  . Alcohol Use: No    FAMILY HISTORY: Family History  Problem Relation Age of Onset  . Cancer Father     EXAM: BP 165/100 mmHg  Pulse 66  Temp(Src) 99.1 F (37.3 C) (Oral)  Resp 20  Ht 5\' 2"  (1.575 m)  Wt 209 lb 1.6 oz (94.847 kg)  BMI 38.24 kg/m2  SpO2 100%  LMP 11/22/2014 CONSTITUTIONAL: Alert and oriented and responds appropriately to questions. Well-appearing; well-nourished HEAD: Normocephalic EYES: Conjunctivae clear, PERRL ENT: normal nose; no rhinorrhea; moist mucous membranes; pharynx without lesions noted NECK: Supple, no meningismus, no LAD  CARD: RRR; S1 and S2 appreciated; no murmurs, no clicks, no rubs, no gallops CHEST: Tenderness to palpation over L chest wall without crepitus or deformity RESP: Normal chest excursion without splinting or tachypnea; breath sounds clear and equal bilaterally; no wheezes, no rhonchi, no rales, no hypoxia or respiratory distress, speaking full sentences ABD/GI: Normal bowel sounds; non-distended; soft, non-tender, no rebound, no guarding, no peritoneal signs BACK:  The back appears normal and is non-tender to palpation, there is no CVA tenderness EXT: Normal ROM in all joints; non-tender to palpation; no edema; normal capillary refill; no cyanosis, no calf tenderness or swelling    SKIN: Normal color for age and race; warm NEURO: Moves all extremities equally, sensation to light touch intact diffusely, cranial nerves II through XII intact PSYCH: The patient's mood  and manner are appropriate. Grooming and personal hygiene are appropriate.  MEDICAL DECISION MAKING: Patient here with chest wall pain. Pain is completely reproducible with palpation of the left side of her chest. Labs, chest x-ray ordered in triage are unremarkable. EKG shows no ischemic changes, arrhythmia or interval changes. She is on exogenous estrogen but denies being a smoker  currently. States she has quit several months ago. She is not tachycardic, tachypneic or hypoxic. Have low suspicion for pulmonary embolus. She was initially hypertensive but this has improved without intervention. I have refilled her amlodipine. I feel she is safe to be discharged him with anti-inflammatory for her chest wall pain. Have recommended outpatient follow-up for further evaluation and monitoring of her blood pressure. Discussed return precautions. She verbalized understanding and is comfortable with plan.     EKG Interpretation  Date/Time:  Tuesday January 22 2015 20:39:33 EDT Ventricular Rate:  93 PR Interval:  138 QRS Duration: 84 QT Interval:  344 QTC Calculation: 427 R Axis:   41 Text Interpretation:  Normal sinus rhythm Normal ECG Confirmed by WARD,  DO, KRISTEN ([id number]) on 01/23/2015 1:50:41 AM         I personally performed the services described in this documentation, which was scribed in my presence. The recorded information has been reviewed and is accurate.   [place], DO 01/23/15 [phone]

## 2015-01-23 NOTE — Discharge Instructions (Signed)

## 2015-03-14 ENCOUNTER — Ambulatory Visit: Payer: Medicaid Other | Admitting: Family Medicine

## 2015-04-05 ENCOUNTER — Telehealth: Payer: Self-pay

## 2015-04-05 NOTE — Telephone Encounter (Signed)
Patient called asking nurse if provider can write prescription for 1 nuva ring until patient is seen by provider. Nurse explained to patient provider can not do that because provider has never seen patient before.  Patient voices understanding and has no further questions at this time.

## 2015-04-05 NOTE — Telephone Encounter (Signed)
Nurse called patient, patient verified date of birth. Patient is due to have nuva ring to be removed on 04/09/15. Patient has been calling and no appointments are available. Nurse spoke with provider, provider recommended patient to use condoms, patient can be seen 04/19/15. Nurse explained to patient next available appointment 04/19/15. Nurse offered to put condoms in a bag and patient can come pick them up, patient reports she probably wouldn't use them. Nurse transferred patient to front office staff to make appointment for birth control 04/19/15.

## 2015-04-19 ENCOUNTER — Encounter: Payer: Self-pay | Admitting: Internal Medicine

## 2015-04-19 ENCOUNTER — Ambulatory Visit: Payer: Medicaid Other | Attending: Internal Medicine | Admitting: Internal Medicine

## 2015-04-19 VITALS — BP 149/93 | HR 96 | Temp 98.5°F | Resp 16 | Ht 62.0 in | Wt 213.0 lb

## 2015-04-19 DIAGNOSIS — I10 Essential (primary) hypertension: Secondary | ICD-10-CM | POA: Diagnosis not present

## 2015-04-19 DIAGNOSIS — F17210 Nicotine dependence, cigarettes, uncomplicated: Secondary | ICD-10-CM | POA: Diagnosis not present

## 2015-04-19 DIAGNOSIS — Z3044 Encounter for surveillance of vaginal ring hormonal contraceptive device: Secondary | ICD-10-CM | POA: Diagnosis not present

## 2015-04-19 DIAGNOSIS — Z3040 Encounter for surveillance of contraceptives, unspecified: Secondary | ICD-10-CM | POA: Diagnosis present

## 2015-04-19 DIAGNOSIS — Z79899 Other long term (current) drug therapy: Secondary | ICD-10-CM | POA: Diagnosis not present

## 2015-04-19 LAB — POCT URINE PREGNANCY: Preg Test, Ur: NEGATIVE

## 2015-04-19 MED ORDER — ETONOGESTREL-ETHINYL ESTRADIOL 0.12-0.015 MG/24HR VA RING: 1.0000 | VAGINAL_RING | VAGINAL | Status: DC

## 2015-04-19 MED ORDER — AMLODIPINE BESYLATE 10 MG PO TABS: 10.0000 mg | ORAL_TABLET | Freq: Every day | ORAL | Status: DC

## 2015-04-19 NOTE — Progress Notes (Signed)
Patient ID: Samantha [name], female   DOB: Jul 31, 1980, 34 y.o.   MRN: Samantha [name]  CC:  HPI: Samantha [name] is a 34 y.o. female here today for a follow up visit.  Patient has past medical history of hypertension. Patient reports that she has been out of her blood pressure medication for almost one month. She has not had symptoms of SOB, chest pain, palpitations, edema, or headaches.  Patient is also concerned because she would like a refill of her Nuvaring. [facility] and states that she has 7 children and does not desire any more. Her LMP was last week and only lasted one day. She does not smoke and states that next year she would like to switch over to Depo-provera to lower her chances of blood clots.   No Known Allergies Past Medical History  Diagnosis Date  . Hypertension    Current Outpatient Prescriptions on File Prior to Visit  Medication Sig Dispense Refill  . amLODipine (NORVASC) 10 MG tablet Take 1 tablet (10 mg total) by mouth daily. 30 tablet 1  . etonogestrel-ethinyl estradiol (NUVARING) 0.12-0.015 MG/24HR vaginal ring Place 1 each vaginally every 28 (twenty-eight) days. Insert vaginally and leave in place for 3 consecutive weeks, then remove for 1 week.    Marland Kitchen ibuprofen (ADVIL,MOTRIN) 800 MG tablet Take 1 tablet (800 mg total) by mouth every 8 (eight) hours as needed for mild pain. 30 tablet 0  . [DISCONTINUED] methyldopa (ALDOMET) 250 MG tablet Take 250 mg by mouth 2 (two) times daily.     No current facility-administered medications on file prior to visit.   Family History  Problem Relation Age of Onset  . Cancer Father    Social History   Social History  . Marital Status: Married    Spouse Name: N/A  . Number of Children: N/A  . Years of Education: N/A   Occupational History  . Not on file.   Social History Main Topics  . Smoking status: Current Every Day Smoker -- 0.50 packs/day for .4 years    Types:  Cigarettes  . Smokeless tobacco: Never Used  . Alcohol Use: No  . Drug Use: No  . Sexual Activity: Yes    Birth Control/ Protection: None   Other Topics Concern  . Not on file   Social History Narrative    Review of Systems: Constitutional: Negative for fever, chills, diaphoresis, activity change, appetite change and fatigue. HENT: Negative for ear pain, nosebleeds, congestion, facial swelling, rhinorrhea, neck pain, neck stiffness and ear discharge.  Eyes: Negative for pain, discharge, redness, itching and visual disturbance. Respiratory: Negative for cough, choking, chest tightness, shortness of breath, wheezing and stridor.  Cardiovascular: Negative for chest pain, palpitations and leg swelling. Gastrointestinal: Negative for abdominal distention. Genitourinary: Negative for dysuria, urgency, frequency, hematuria, flank pain, decreased urine volume, difficulty urinating and dyspareunia.  Musculoskeletal: Negative for back pain, joint swelling, arthralgias and gait problem. Neurological: Negative for dizziness, tremors, seizures, syncope, facial asymmetry, speech difficulty, weakness, light-headedness, numbness and headaches.  Hematological: Negative for adenopathy. Does not bruise/bleed easily. Psychiatric/Behavioral: Negative for hallucinations, behavioral problems, confusion, dysphoric mood, decreased concentration and agitation.    Objective:   Filed Vitals:   04/19/15 1016  BP: 149/93  Pulse: 96  Temp: 98.5 F (36.9 C)  Resp: 16    Physical Exam  Constitutional: She is oriented to person, place, and time.  overweight  Neck: No JVD present.  Cardiovascular: Normal rate,  regular rhythm and normal heart sounds.   Pulmonary/Chest: Effort normal and breath sounds normal.  Musculoskeletal: She exhibits no edema.  Neurological: She is alert and oriented to person, place, and time.  Skin: Skin is warm and dry.  Psychiatric: She has a normal mood and affect.     Lab  Results  Component Value Date   WBC 10.8* 01/22/2015   HGB 12.7 01/22/2015   HCT 37.3 01/22/2015   MCV 83.6 01/22/2015   PLT 288 01/22/2015   Lab Results  Component Value Date   CREATININE 0.77 01/22/2015   BUN 9 01/22/2015   NA 139 01/22/2015   K 3.6 01/22/2015   CL 107 01/22/2015   CO2 23 01/22/2015    No results found for: HGBA1C Lipid Panel  No results found for: CHOL, TRIG, HDL, CHOLHDL, VLDL, LDLCALC     Assessment and plan:   [name] was seen today for follow-up.  Diagnoses and all orders for this visit:  Encounter for surveillance of contraceptives -     POCT urine pregnancy -    Refilled etonogestrel-ethinyl estradiol (NUVARING) 0.12-0.015 MG/24HR vaginal ring; Place 1 each vaginally every 28 (twenty-eight) days. Insert vaginally and leave in place for 3 consecutive weeks, then remove for 1 week.  Essential hypertension -     amLODipine (NORVASC) 10 MG tablet; Take 1 tablet (10 mg total) by mouth daily. Patient's blood pressure is elevated today. I will  Refill her past medication. Lats bmet was 2 months ago and was normal.   Return in about 6 months (around 10/17/2015) for Hypertension.        Samantha [name], [facility] [phone] 04/19/2015, 10:52 AM

## 2015-04-19 NOTE — Patient Instructions (Signed)

## 2015-04-19 NOTE — Progress Notes (Signed)
Patient here for follow up on her HTN Patient presents with elevated blood pressure but Stated she has been out of her medication for a while

## 2015-05-22 ENCOUNTER — Other Ambulatory Visit: Payer: Self-pay | Admitting: Family Medicine

## 2015-10-07 ENCOUNTER — Other Ambulatory Visit: Payer: Self-pay | Admitting: Internal Medicine

## 2015-10-29 ENCOUNTER — Telehealth: Payer: Self-pay | Admitting: Internal Medicine

## 2015-10-29 MED ORDER — AMLODIPINE BESYLATE 10 MG PO TABS: 10.0000 mg | ORAL_TABLET | Freq: Every day | ORAL | Status: DC

## 2015-10-29 NOTE — Telephone Encounter (Signed)
Medication refilled to last until 11/12/15 - she can obtain further refills at that time.

## 2015-10-29 NOTE — Telephone Encounter (Signed)
Patient called requesting medication refill on amLODipine (NORVASC) 10 MG tablet, patient is scheduled 06/13 and will run out of medication, please f/up

## 2015-11-12 ENCOUNTER — Ambulatory Visit: Payer: Medicaid Other | Attending: Internal Medicine | Admitting: Internal Medicine

## 2015-11-12 ENCOUNTER — Encounter: Payer: Self-pay | Admitting: Internal Medicine

## 2015-11-12 VITALS — BP 137/96 | HR 106 | Temp 98.5°F | Wt 226.2 lb

## 2015-11-12 DIAGNOSIS — I10 Essential (primary) hypertension: Secondary | ICD-10-CM

## 2015-11-12 DIAGNOSIS — Z3040 Encounter for surveillance of contraceptives, unspecified: Secondary | ICD-10-CM

## 2015-11-12 DIAGNOSIS — E1165 Type 2 diabetes mellitus with hyperglycemia: Secondary | ICD-10-CM | POA: Diagnosis not present

## 2015-11-12 DIAGNOSIS — R609 Edema, unspecified: Secondary | ICD-10-CM | POA: Diagnosis not present

## 2015-11-12 DIAGNOSIS — Z3044 Encounter for surveillance of vaginal ring hormonal contraceptive device: Secondary | ICD-10-CM | POA: Diagnosis not present

## 2015-11-12 LAB — POCT GLYCOSYLATED HEMOGLOBIN (HGB A1C): Hemoglobin A1C: 8.1

## 2015-11-12 LAB — GLUCOSE, POCT (MANUAL RESULT ENTRY): POC Glucose: 251 mg/dl — AB (ref 70–99)

## 2015-11-12 MED ORDER — TRUE METRIX GO GLUCOSE METER W/DEVICE KIT: 1.0000 | PACK | Freq: Three times a day (TID) | Status: DC | PRN

## 2015-11-12 MED ORDER — GLUCOSE BLOOD VI STRP: ORAL_STRIP | Status: DC

## 2015-11-12 MED ORDER — ETONOGESTREL-ETHINYL ESTRADIOL 0.12-0.015 MG/24HR VA RING: 1.0000 | VAGINAL_RING | VAGINAL | Status: DC

## 2015-11-12 MED ORDER — TRUEPLUS LANCETS 26G MISC: 1.0000 | Freq: Three times a day (TID) | Status: DC | PRN

## 2015-11-12 MED ORDER — METFORMIN HCL 1000 MG PO TABS: 1000.0000 mg | ORAL_TABLET | Freq: Two times a day (BID) | ORAL | Status: DC

## 2015-11-12 MED ORDER — ACCU-CHEK AVIVA PLUS W/DEVICE KIT: 1.0000 "application " | PACK | Freq: Four times a day (QID) | Status: DC

## 2015-11-12 MED ORDER — FUROSEMIDE 20 MG PO TABS: 20.0000 mg | ORAL_TABLET | Freq: Every day | ORAL | Status: DC

## 2015-11-12 MED ORDER — METFORMIN HCL 500 MG PO TABS: 500.0000 mg | ORAL_TABLET | Freq: Two times a day (BID) | ORAL | Status: DC

## 2015-11-12 MED ORDER — AMLODIPINE BESYLATE 10 MG PO TABS: 10.0000 mg | ORAL_TABLET | Freq: Every day | ORAL | Status: DC

## 2015-11-12 MED ORDER — ACCU-CHEK SOFT TOUCH LANCETS MISC: Status: DC

## 2015-11-12 NOTE — Progress Notes (Signed)
Samantha [name], is a 35 y.o. female  [id number]  [id number]  DOB - [birth year]  CC:  Chief Complaint  Patient presents with  . Establish Care    HTN       HPI: Samantha [name] is a 35 y.o. female here today to establish medical care, last seen in clinic 11/16 w/ NP w/ [facility] of HTN. 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  She also has 7 kid, ranging 3yo - 18yo.  Lots of stressors but she is committed to this and want to succeed.  She drinks a  Lot of water, grape juice, does not drink etoh.  Smokes occasionally.  She states she is trying to loose weight, and was doing water + vinegar for few weeks. But this cause her to straining w/ frequent stooling and occasionally brbpr.  She has since stopped the vinegar and the bleeding has also stopped.  Admits to eating out/processed foods/fast food often.  Not watching salt load either, has been using garlic-salt to cook b/c someone told her it was healthy.  Currently on Nuvigil ring for OTC  Patient has No headache, No chest pain, No abdominal pain - No Nausea, No new weakness tingling or numbness, No Cough - SOB.  No Known Allergies Current Outpatient Prescriptions on File Prior to Visit  Medication Sig Dispense Refill  . ibuprofen (ADVIL,MOTRIN) 800 MG tablet Take 1 tablet (800 mg total) by mouth every 8 (eight) hours as needed for mild pain. 30 tablet 0  . [DISCONTINUED] methyldopa (ALDOMET) 250 MG tablet Take 250 mg by mouth 2 (two) times daily.     No current facility-administered medications on file prior to visit.   Family History  Problem Relation Age of Onset  . Cancer Father    Social History   Social History  . Marital Status: Married    Spouse Name: N/A  . Number of Children: N/A  . Years of Education: N/A   Occupational History  . Not on file.   Social History Main Topics  . Smoking status: Current Every Day Smoker -- 0.50 packs/day  for .4 years    Types: Cigarettes  . Smokeless tobacco: Never Used  . Alcohol Use: No  . Drug Use: No  . Sexual Activity: Yes    Birth Control/ Protection: None   Other Topics Concern  . Not on file   Social History Narrative    Review of Systems: Constitutional: Negative for fever, chills, diaphoresis, activity change, appetite change and fatigue. HENT: Negative for ear pain, nosebleeds, congestion, facial swelling, rhinorrhea, neck pain, neck stiffness and ear discharge.  Eyes: Negative for pain, discharge, redness, itching and visual disturbance. Respiratory: Negative for cough, choking, chest tightness, shortness of breath, wheezing and stridor.  Cardiovascular: Negative for chest pain, palpitations; + intermittant bilaterral leg swelling. Gastrointestinal: Negative for abdominal distention. Genitourinary: Negative for dysuria, urgency, frequency, hematuria, flank pain, decreased urine volume, difficulty urinating and dyspareunia.  Musculoskeletal: Negative for back pain, joint swelling, arthralgia and gait problem. Neurological: Negative for dizziness, tremors, seizures, syncope, facial asymmetry, speech difficulty, weakness, light-headedness, numbness and headaches.  Hematological: Negative for adenopathy. Does not bruise/bleed easily. Psychiatric/Behavioral: Negative for hallucinations, behavioral problems, confusion, dysphoric mood, decreased concentration and agitation.   No si/hi/avh.  Stressed, but "can deal with it."   Objective:   Filed Vitals:   11/12/15 0921 11/12/15 0925  BP: 149/102 137/96  Pulse: 101 106  Temp: 98.5 F (  36.9 C)     Filed Weights   11/12/15 0921  Weight: 226 lb 3.2 oz (102.604 kg)    BP Readings from Last 3 Encounters:  11/12/15 137/96  04/19/15 149/93  01/23/15 156/90   bmi 41.3  Physical Exam: Constitutional: Patient appears well-developed and well-nourished.  AAOx3. ,morbidly obese, tearful w/  New dx of DM HENT: Normocephalic,  atraumatic, External right and left ear normal. Oropharynx is clear and moist.  Eyes: Conjunctivae and EOM are normal. PERRL, no scleral icterus. Neck: Normal ROM. Neck supple. No JVD. No tracheal deviation. No thyromegaly. CVS: RRR, S1/S2 +, no murmurs, no gallops, no carotid bruit.  Pulmonary: Effort and breath sounds normal, no stridor, rhonchi, wheezes, rales.  Abdominal: Soft. BS +, no distension, tenderness, rebound or guarding.  Musculoskeletal: Normal range of motion. No edema and no tenderness.  LE: bilat/ no c/c; tight le/ 2+ edema, w/ no calve tenderness., pulses 2+ bilateral. Neuro: Alert. Good muscle tone coordination. No cranial nerve deficit grossly. Skin: Skin is warm and dry. No rash noted. Not diaphoretic. No erythema. No pallor. Psychiatric: Normal mood and affect. Behavior, judgment, thought content normal. Tearful, appropriate  Lab Results  Component Value Date   WBC 10.8* 01/22/2015   HGB 12.7 01/22/2015   HCT 37.3 01/22/2015   MCV 83.6 01/22/2015   PLT 288 01/22/2015   Lab Results  Component Value Date   CREATININE 0.77 01/22/2015   BUN 9 01/22/2015   NA 139 01/22/2015   K 3.6 01/22/2015   CL 107 01/22/2015   CO2 23 01/22/2015    Lab Results  Component Value Date   HGBA1C 8.1 11/12/2015   Lipid Panel  No results found for: CHOL, TRIG, HDL, CHOLHDL, VLDL, LDLCALC     Depression screen [facility] 2/9 11/12/2015  Decreased Interest 0  Down, Depressed, Hopeless 0  PHQ - 2 Score 0  Altered sleeping 0  Tired, decreased energy 0  Change in appetite 0  Feeling bad or failure about yourself  0  Trouble concentrating 0  Moving slowly or fidgety/restless 0  Suicidal thoughts 0  PHQ-9 Score 0  Difficult doing work/chores Not difficult at all    Assessment and plan:   1. Essential hypertension Uncontrolled, did not take her bp med this am to get "true reading" - advised to to take norvasc 10 daily - dw pt at length re: low salt diet/exercise   2.  Encounter for surveillance of contraceptives - per pt, [place]  07/2014 was normal. Not due til 2019 - etonogestrel-ethinyl estradiol (NUVARING) 0.12-0.015 MG/24HR vaginal ring; Place 1 each vaginally every 28 (twenty-eight) days. Insert vaginally and leave in place for 3 consecutive weeks, then remove for 1 week.  Dispense: 3 each; Refill: 3  3. Type 2 diabetes mellitus with hyperglycemia, without long-term current use of insulin (HCC) New diagnosis - POCT glucose (manual entry) 251 - POCT glycosylated hemoglobin (Hb A1C) 8.1 - dm education on diet. - metformin 1000bid - trumetrix glucometer and supplies - [employer] edu pt on glucometer use - f/u [facility] 2 wks for dm chek  4. Morbid obesity, unspecified obesity type ([place]) bmi 41, diet /exercise lifestyle modification discussions  5. Health maintenance Pap due 2019  6. Numerous stressors, no si/hi/avh - offered antidepressant, but she does not feel needs, feels due to all her stressors and new dm dx.  Return in about 3 months (around 02/12/2016).  The patient was given clear instructions to go to ER  or return to medical center if symptoms don't improve, worsen or new problems develop. The patient verbalized understanding. The patient was told to call to get lab results if they haven't heard anything in the next week.      Maren Reamer, MD, [facility], [place]   11/12/2015, 10:29 AM

## 2015-11-12 NOTE — Patient Instructions (Addendum)
stacey pharm d 2 wks/ dm chk    DASH Eating Plan DASH stands for "Dietary Approaches to Stop Hypertension." The DASH eating plan is a healthy eating plan that has been shown to reduce high blood pressure (hypertension). Additional health benefits may include reducing the risk of type 2 diabetes mellitus, heart disease, and stroke. The DASH eating plan may also help with weight loss. WHAT DO I NEED TO KNOW ABOUT THE DASH EATING PLAN? For the DASH eating plan, you will follow these general guidelines:  Choose foods with a percent daily value for sodium of less than 5% (as listed on the food label).  Use salt-free seasonings or herbs instead of table salt or sea salt.  Check with your health care provider or pharmacist before using salt substitutes.  Eat lower-sodium products, often labeled as "lower sodium" or "no salt added."  Eat fresh foods.  Eat more vegetables, fruits, and low-fat dairy products.  Choose whole grains. Look for the word "whole" as the first word in the ingredient list.  Choose fish and skinless chicken or [place] more often than red meat. Limit fish, poultry, and meat to 6 oz (170 g) each day.  Limit sweets, desserts, sugars, and sugary drinks.  Choose heart-healthy fats.  Limit cheese to 1 oz (28 g) per day.  Eat more home-cooked food and less restaurant, buffet, and fast food.  Limit fried foods.  Cook foods using methods other than frying.  Limit canned vegetables. If you do use them, rinse them well to decrease the sodium.  When eating at a restaurant, ask that your food be prepared with less salt, or no salt if possible. WHAT FOODS CAN I EAT? Seek help from a dietitian for individual calorie needs. Grains Whole grain or whole wheat bread. Brown rice. Whole grain or whole wheat pasta. Quinoa, bulgur, and whole grain cereals. Low-sodium cereals. Corn or whole wheat flour tortillas. Whole grain cornbread. Whole grain crackers. Low-sodium  crackers. Vegetables Fresh or frozen vegetables (raw, steamed, roasted, or grilled). Low-sodium or reduced-sodium tomato and vegetable juices. Low-sodium or reduced-sodium tomato sauce and paste. Low-sodium or reduced-sodium canned vegetables.  Fruits All fresh, canned (in natural juice), or frozen fruits. Meat and Other Protein Products Ground beef (85% or leaner), grass-fed beef, or beef trimmed of fat. Skinless chicken or [place]. Ground chicken or [place]. Pork trimmed of fat. All fish and seafood. Eggs. Dried beans, peas, or lentils. Unsalted nuts and seeds. Unsalted canned beans. Dairy Low-fat dairy products, such as skim or 1% milk, 2% or reduced-fat cheeses, low-fat ricotta or cottage cheese, or plain low-fat yogurt. Low-sodium or reduced-sodium cheeses. Fats and Oils Tub margarines without trans fats. Light or reduced-fat mayonnaise and salad dressings (reduced sodium). Avocado. Safflower, olive, or canola oils. Natural peanut or almond butter. Other Unsalted popcorn and pretzels. The items listed above may not be a complete list of recommended foods or beverages. Contact your dietitian for more options. WHAT FOODS ARE NOT RECOMMENDED? Grains White bread. White pasta. White rice. Refined cornbread. Bagels and croissants. Crackers that contain trans fat. Vegetables Creamed or fried vegetables. Vegetables in a cheese sauce. Regular canned vegetables. Regular canned tomato sauce and paste. Regular tomato and vegetable juices. Fruits Dried fruits. Canned fruit in light or heavy syrup. Fruit juice. Meat and Other Protein Products Fatty cuts of meat. Ribs, chicken wings, bacon, sausage, bologna, salami, chitterlings, fatback, hot dogs, bratwurst, and packaged luncheon meats. Salted nuts and seeds. Canned beans with salt. Dairy Whole or 2% milk,  cream, half-and-half, and cream cheese. Whole-fat or sweetened yogurt. Full-fat cheeses or blue cheese. Nondairy creamers and whipped toppings.  Processed cheese, cheese spreads, or cheese curds. Condiments Onion and garlic salt, seasoned salt, table salt, and sea salt. Canned and packaged gravies. Worcestershire sauce. Tartar sauce. Barbecue sauce. Teriyaki sauce. Soy sauce, including reduced sodium. Steak sauce. Fish sauce. Oyster sauce. Cocktail sauce. Horseradish. Ketchup and mustard. Meat flavorings and tenderizers. Bouillon cubes. Hot sauce. Tabasco sauce. Marinades. Taco seasonings. Relishes. Fats and Oils Butter, stick margarine, lard, shortening, ghee, and bacon fat. Coconut, palm kernel, or palm oils. Regular salad dressings. Other Pickles and olives. Salted popcorn and pretzels. The items listed above may not be a complete list of foods and beverages to avoid. Contact your dietitian for more information. WHERE CAN I FIND MORE INFORMATION? National Heart, [employer], [employer]: travelstabloid.com   This information is not intended to replace advice given to you by your health care provider. Make sure you discuss any questions you have with your health care provider.   Document Released: 05/07/2011 Document Revised: 06/08/2014 Document Reviewed: 03/22/2013 Elsevier Interactive Patient Education 2016 Else[employer] Inc[employer]  - Low-Sodium Eating Plan Sodium raises blood pressure and causes water to be held in the body. Getting less sodium from food will help lower your blood pressure, reduce any swelling, and protect your heart, liver, and kidneys. We get sodium by adding salt (sodium chloride) to food. Most of our sodium comes from canned, boxed, and frozen foods. Restaurant foods, fast foods, and pizza are also very high in sodium. Even if you take medicine to lower your blood pressure or to reduce fluid in your body, getting less sodium from your food is important. WHAT IS MY PLAN? Most people should limit their sodium intake to 2,300 mg a day. Your health care provider recommends that you limit your  sodium intake to __________ a day.  WHAT DO I NEED TO KNOW ABOUT THIS EATING PLAN? For the low-sodium eating plan, you will follow these general guidelines:  Choose foods with a % Daily Value for sodium of less than 5% (as listed on the food label).   Use salt-free seasonings or herbs instead of table salt or sea salt.   Check with your health care provider or pharmacist before using salt substitutes.   Eat fresh foods.  Eat more vegetables and fruits.  Limit canned vegetables. If you do use them, rinse them well to decrease the sodium.   Limit cheese to 1 oz (28 g) per day.   Eat lower-sodium products, often labeled as "lower sodium" or "no salt added."  Avoid foods that contain monosodium glutamate (MSG). MSG is sometimes added to [place] food and some canned foods.  Check food labels (Nutrition Facts labels) on foods to learn how much sodium is in one serving.  Eat more home-cooked food and less restaurant, buffet, and fast food.  When eating at a restaurant, ask that your food be prepared with less salt, or no salt if possible.  HOW DO I READ FOOD LABELS FOR SODIUM INFORMATION? The Nutrition Facts label lists the amount of sodium in one serving of the food. If you eat more than one serving, you must multiply the listed amount of sodium by the number of servings. Food labels may also identify foods as:  Sodium free--Less than 5 mg in a serving.  Very low sodium--35 mg or less in a serving.  Low sodium--140 mg or less in a serving.  Light in sodium--50% less  sodium in a serving. For example, if a food that usually has 300 mg of sodium is changed to become light in sodium, it will have 150 mg of sodium.  Reduced sodium--25% less sodium in a serving. For example, if a food that usually has 400 mg of sodium is changed to reduced sodium, it will have 300 mg of sodium. WHAT FOODS CAN I EAT? Grains Low-sodium cereals, including oats, puffed wheat and rice, and  shredded wheat cereals. Low-sodium crackers. Unsalted rice and pasta. Lower-sodium bread.  Vegetables Frozen or fresh vegetables. Low-sodium or reduced-sodium canned vegetables. Low-sodium or reduced-sodium tomato sauce and paste. Low-sodium or reduced-sodium tomato and vegetable juices.  Fruits Fresh, frozen, and canned fruit. Fruit juice.  Meat and Other Protein Products Low-sodium canned tuna and salmon. Fresh or frozen meat, poultry, seafood, and fish. Lamb. Unsalted nuts. Dried beans, peas, and lentils without added salt. Unsalted canned beans. Homemade soups without salt. Eggs.  Dairy Milk. Soy milk. Ricotta cheese. Low-sodium or reduced-sodium cheeses. Yogurt.  Condiments Fresh and dried herbs and spices. Salt-free seasonings. Onion and garlic powders. Low-sodium varieties of mustard and ketchup. Fresh or refrigerated horseradish. Lemon juice.  Fats and Oils Reduced-sodium salad dressings. Unsalted butter.  Other Unsalted popcorn and pretzels.  The items listed above may not be a complete list of recommended foods or beverages. Contact your dietitian for more options. WHAT FOODS ARE NOT RECOMMENDED? Grains Instant hot cereals. Bread stuffing, pancake, and biscuit mixes. Croutons. Seasoned rice or pasta mixes. Noodle soup cups. Boxed or frozen macaroni and cheese. Self-rising flour. Regular salted crackers. Vegetables Regular canned vegetables. Regular canned tomato sauce and paste. Regular tomato and vegetable juices. Frozen vegetables in sauces. Salted [place] fries. Olives. Angie Fava. Relishes. Sauerkraut. Salsa. Meat and Other Protein Products Salted, canned, smoked, spiced, or pickled meats, seafood, or fish. Bacon, ham, sausage, hot dogs, corned beef, chipped beef, and packaged luncheon meats. Salt pork. Jerky. Pickled herring. Anchovies, regular canned tuna, and sardines. Salted nuts. Dairy Processed cheese and cheese spreads. Cheese curds. Blue cheese and cottage  cheese. Buttermilk.  Condiments Onion and garlic salt, seasoned salt, table salt, and sea salt. Canned and packaged gravies. Worcestershire sauce. Tartar sauce. Barbecue sauce. Teriyaki sauce. Soy sauce, including reduced sodium. Steak sauce. Fish sauce. Oyster sauce. Cocktail sauce. Horseradish that you find on the shelf. Regular ketchup and mustard. Meat flavorings and tenderizers. Bouillon cubes. Hot sauce. Tabasco sauce. Marinades. Taco seasonings. Relishes. Fats and Oils Regular salad dressings. Salted butter. Margarine. Ghee. Bacon fat.  Other Potato and tortilla chips. Corn chips and puffs. Salted popcorn and pretzels. Canned or dried soups. Pizza. Frozen entrees and pot pies.  The items listed above may not be a complete list of foods and beverages to avoid. Contact your dietitian for more information.   This information is not intended to replace advice given to you by your health care provider. Make sure you discuss any questions you have with your health care provider.   Document Released: 11/07/2001 Document Revised: 06/08/2014 Document Reviewed: 03/22/2013 Elsevier Interactive Patient Education 2016 Elsevier Inc.  - Diabetes Mellitus and Food It is important for you to manage your blood sugar (glucose) level. Your blood glucose level can be greatly affected by what you eat. Eating healthier foods in the appropriate amounts throughout the day at about the same time each day will help you control your blood glucose level. It can also help slow or prevent worsening of your diabetes mellitus. Healthy eating may even help you improve the  level of your blood pressure and reach or maintain a healthy weight.  General recommendations for healthful eating and cooking habits include:  Eating meals and snacks regularly. Avoid going long periods of time without eating to lose weight.  Eating a diet that consists mainly of plant-based foods, such as fruits, vegetables, nuts, legumes, and  whole grains.  Using low-heat cooking methods, such as baking, instead of high-heat cooking methods, such as deep frying. Work with your dietitian to make sure you understand how to use the Nutrition Facts information on food labels. HOW CAN FOOD AFFECT ME? Carbohydrates Carbohydrates affect your blood glucose level more than any other type of food. Your dietitian will help you determine how many carbohydrates to eat at each meal and teach you how to count carbohydrates. Counting carbohydrates is important to keep your blood glucose at a healthy level, especially if you are using insulin or taking certain medicines for diabetes mellitus. Alcohol Alcohol can cause sudden decreases in blood glucose (hypoglycemia), especially if you use insulin or take certain medicines for diabetes mellitus. Hypoglycemia can be a life-threatening condition. Symptoms of hypoglycemia (sleepiness, dizziness, and disorientation) are similar to symptoms of having too much alcohol.  If your health care provider has given you approval to drink alcohol, do so in moderation and use the following guidelines:  Women should not have more than one drink per day, and men should not have more than two drinks per day. One drink is equal to:  12 oz of beer.  5 oz of wine.  1 oz of hard liquor.  Do not drink on an empty stomach.  Keep yourself hydrated. Have water, diet soda, or unsweetened iced tea.  Regular soda, juice, and other mixers might contain a lot of carbohydrates and should be counted. WHAT FOODS ARE NOT RECOMMENDED? As you make food choices, it is important to remember that all foods are not the same. Some foods have fewer nutrients per serving than other foods, even though they might have the same number of calories or carbohydrates. It is difficult to get your body what it needs when you eat foods with fewer nutrients. Examples of foods that you should avoid that are high in calories and carbohydrates but low in  nutrients include:  Trans fats (most processed foods list trans fats on the Nutrition Facts label).  Regular soda.  Juice.  Candy.  Sweets, such as cake, pie, doughnuts, and cookies.  Fried foods. WHAT FOODS CAN I EAT? Eat nutrient-rich foods, which will nourish your body and keep you healthy. The food you should eat also will depend on several factors, including:  The calories you need.  The medicines you take.  Your weight.  Your blood glucose level.  Your blood pressure level.  Your cholesterol level. You should eat a variety of foods, including:  Protein.  Lean cuts of meat.  Proteins low in saturated fats, such as fish, egg whites, and beans. Avoid processed meats.  Fruits and vegetables.  Fruits and vegetables that may help control blood glucose levels, such as apples, mangoes, and yams.  Dairy products.  Choose fat-free or low-fat dairy products, such as milk, yogurt, and cheese.  Grains, bread, pasta, and rice.  Choose whole grain products, such as multigrain bread, whole oats, and brown rice. These foods may help control blood pressure.  Fats.  Foods containing healthful fats, such as nuts, avocado, olive oil, canola oil, and fish. DOES EVERYONE WITH DIABETES MELLITUS HAVE THE SAME MEAL PLAN? Because  every person with diabetes mellitus is different, there is not one meal plan that works for everyone. It is very important that you meet with a dietitian who will help you create a meal plan that is just right for you.   This information is not intended to replace advice given to you by your health care provider. Make sure you discuss any questions you have with your health care provider.   Document Released: 02/12/2005 Document Revised: 06/08/2014 Document Reviewed: 04/14/2013 Elsevier Interactive Patient Education 2016 [place] for Eating Away From Home If You Have Diabetes Controlling your level of blood glucose, also known as blood  sugar, can be challenging. It can be even more difficult when you do not prepare your own meals. The following tips can help you manage your diabetes when you eat away from home. PLANNING AHEAD Plan ahead if you know you will be eating away from home:  Ask your health care provider how to time meals and medicine if you are taking insulin.  Make a list of restaurants near you that offer healthy choices. If they have a carry-out menu, take it home and plan what you will order ahead of time.  Look up the restaurant you want to eat at online. Many chain and fast-food restaurants list nutritional information online. Use this information to choose the healthiest options and to calculate how many carbohydrates will be in your meal.  Use a carbohydrate-counting book or mobile app to look up the carbohydrate content and serving size of the foods you want to eat.  Become familiar with serving sizes and learn to recognize how many servings are in a portion. This will allow you to estimate how many carbohydrates you can eat. FREE FOODS A "free food" is any food or drink that has less than 5 g of carbohydrates per serving. Free foods include:  Many vegetables.  Hard boiled eggs.  Nuts or seeds.  Olives.  Cheeses.  Meats. These types of foods make good appetizer choices and are often available at salad bars. Lemon juice, vinegar, or a low-calorie salad dressing of fewer than 20 calories per serving can be used as a "free" salad dressing.  CHOICES TO REDUCE CARBOHYDRATES  Substitute nonfat sweetened yogurt with a sugar-free yogurt. Yogurt made from soy milk may also be used, but you will still want a sugar-free or plain option to choose a lower carbohydrate amount.  Ask your server to take away the bread basket or chips from your table.  Order fresh fruit. A salad bar often offers fresh fruit choices. Avoid canned fruit because it is usually packed in sugar or syrup.  Order a salad, and eat it  without dressing. Or, create a "free" salad dressing.  Ask for substitutions. For example, instead of [place] fries, request an order of a vegetable such as salad, green beans, or broccoli. OTHER TIPS   If you take insulin, take the insulin once your food arrives to your table. This will ensure your insulin and food are timed correctly.  Ask your server about the portion size before your order, and ask for a take-out box if the portion has more servings than you should have. When your food comes, leave the amount you should have on the plate, and put the rest in the take-out box.  Consider splitting an entree with someone and ordering a side salad.   This information is not intended to replace advice given to you by your health care provider.  Make sure you discuss any questions you have with your health care provider.   Document Released: 05/18/2005 Document Revised: 02/06/2015 Document Reviewed: 08/15/2013 Elsevier Interactive Patient Education 2016 [employer].  - Diabetes and Exercise Exercising regularly is important. It is not just about losing weight. It has many health benefits, such as:  Improving your overall fitness, flexibility, and endurance.  Increasing your bone density.  Helping with weight control.  Decreasing your body fat.  Increasing your muscle strength.  Reducing stress and tension.  Improving your overall health. People with diabetes who exercise gain additional benefits because exercise:  Reduces appetite.  Improves the body's use of blood sugar (glucose).  Helps lower or control blood glucose.  Decreases blood pressure.  Helps control blood lipids (such as cholesterol and triglycerides).  Improves the body's use of the hormone insulin by:  Increasing the body's insulin sensitivity.  Reducing the body's insulin needs.  Decreases the risk for heart disease because exercising:  Lowers cholesterol and triglycerides levels.  Increases the  levels of good cholesterol (such as high-density lipoproteins [HDL]) in the body.  Lowers blood glucose levels. YOUR ACTIVITY PLAN  Choose an activity that you enjoy, and set realistic goals. To exercise safely, you should begin practicing any new physical activity slowly, and gradually increase the intensity of the exercise over time. Your health care provider or diabetes educator can help create an activity plan that works for you. General recommendations include:  Encouraging children to engage in at least 60 minutes of physical activity each day.  Stretching and performing strength training exercises, such as yoga or weight lifting, at least 2 times per week.  Performing a total of at least 150 minutes of moderate-intensity exercise each week, such as brisk walking or water aerobics.  Exercising at least 3 days per week, making sure you allow no more than 2 consecutive days to pass without exercising.  Avoiding long periods of inactivity (90 minutes or more). When you have to spend an extended period of time sitting down, take frequent breaks to walk or stretch. RECOMMENDATIONS FOR EXERCISING WITH TYPE 1 OR TYPE 2 DIABETES   Check your blood glucose before exercising. If blood glucose levels are greater than 240 mg/dL, check for urine ketones. Do not exercise if ketones are present.  Avoid injecting insulin into areas of the body that are going to be exercised. For example, avoid injecting insulin into:  The arms when playing tennis.  The legs when jogging.  Keep a record of:  Food intake before and after you exercise.  Expected peak times of insulin action.  Blood glucose levels before and after you exercise.  The type and amount of exercise you have done.  Review your records with your health care provider. Your health care provider will help you to develop guidelines for adjusting food intake and insulin amounts before and after exercising.  If you take insulin or oral  hypoglycemic agents, watch for signs and symptoms of hypoglycemia. They include:  Dizziness.  Shaking.  Sweating.  Chills.  Confusion.  Drink plenty of water while you exercise to prevent dehydration or heat stroke. Body water is lost during exercise and must be replaced.  Talk to your health care provider before starting an exercise program to make sure it is safe for you. Remember, almost any type of activity is better than none.   This information is not intended to replace advice given to you by your health care provider. Make sure you discuss any  questions you have with your health care provider.   Document Released: 08/08/2003 Document Revised: 10/02/2014 Document Reviewed: 10/25/2012 Elsevier Interactive Patient Education [employer].

## 2015-11-12 NOTE — Addendum Note (Signed)
Addended byLottie Mussel T on: 11/12/2015 10:46 AM   Modules accepted: Orders, Medications

## 2015-11-26 ENCOUNTER — Encounter: Payer: Self-pay | Admitting: Pharmacist

## 2015-11-26 ENCOUNTER — Ambulatory Visit: Payer: Medicaid Other | Attending: Internal Medicine | Admitting: Pharmacist

## 2015-11-26 VITALS — BP 149/99

## 2015-11-26 DIAGNOSIS — E1165 Type 2 diabetes mellitus with hyperglycemia: Secondary | ICD-10-CM

## 2015-11-26 MED ORDER — METFORMIN HCL ER 500 MG PO TB24: 1000.0000 mg | ORAL_TABLET | Freq: Two times a day (BID) | ORAL | Status: DC

## 2015-11-26 MED ORDER — LISINOPRIL 10 MG PO TABS: 10.0000 mg | ORAL_TABLET | Freq: Every day | ORAL | Status: DC

## 2015-11-26 NOTE — Patient Instructions (Addendum)
Thanks for coming to see Samantha [name]!  Switch to the metformin extended release - take 2 tablets with breakfast and 2 tablets with dinner. This should help with the stomach upset.  Start lisinopril 10 mg daily for blood pressure.   Come back and see Samantha [name] in 2 weeks  Basic Carbohydrate Counting for Diabetes Mellitus Carbohydrate counting is a method for keeping track of the amount of carbohydrates you eat. Eating carbohydrates naturally increases the level of sugar (glucose) in your blood, so it is important for you to know the amount that is okay for you to have in every meal. Carbohydrate counting helps keep the level of glucose in your blood within normal limits. The amount of carbohydrates allowed is different for every person. A dietitian can help you calculate the amount that is right for you. Once you know the amount of carbohydrates you can have, you can count the carbohydrates in the foods you want to eat. Carbohydrates are found in the following foods:  Grains, such as breads and cereals.  Dried beans and soy products.  Starchy vegetables, such as potatoes, peas, and corn.  Fruit and fruit juices.  Milk and yogurt.  Sweets and snack foods, such as cake, cookies, candy, chips, soft drinks, and fruit drinks. CARBOHYDRATE COUNTING There are two ways to count the carbohydrates in your food. You can use either of the methods or a combination of both. Reading the "Nutrition Facts" on [place] The "Nutrition Facts" is an area that is included on the labels of almost all packaged food and beverages in the [place]. It includes the serving size of that food or beverage and information about the nutrients in each serving of the food, including the grams (g) of carbohydrate per serving.  Decide the number of servings of this food or beverage that you will be able to eat or drink. Multiply that number of servings by the number of grams of carbohydrate that is listed on the label for that  serving. The total will be the amount of carbohydrates you will be having when you eat or drink this food or beverage. Learning Standard Serving Sizes of Food When you eat food that is not packaged or does not include "Nutrition Facts" on the label, you need to measure the servings in order to count the amount of carbohydrates.A serving of most carbohydrate-rich foods contains about 15 g of carbohydrates. The following list includes serving sizes of carbohydrate-rich foods that provide 15 g ofcarbohydrate per serving:   1 slice of bread (1 oz) or 1 six-inch tortilla.    of a hamburger bun or English muffin.  4-6 crackers.   cup unsweetened dry cereal.    cup hot cereal.   cup rice or pasta.    cup mashed potatoes or  of a large baked potato.  1 cup fresh fruit or one small piece of fruit.    cup canned or frozen fruit or fruit juice.  1 cup milk.   cup plain fat-free yogurt or yogurt sweetened with artificial sweeteners.   cup cooked dried beans or starchy vegetable, such as peas, corn, or potatoes.  Decide the number of standard-size servings that you will eat. Multiply that number of servings by 15 (the grams of carbohydrates in that serving). For example, if you eat 2 cups of strawberries, you will have eaten 2 servings and 30 g of carbohydrates (2 servings x 15 g = 30 g). For foods such as soups and casseroles, in which more  than one food is mixed in, you will need to count the carbohydrates in each food that is included. EXAMPLE OF CARBOHYDRATE COUNTING Sample Dinner  3 oz chicken breast.   cup of brown rice.   cup of corn.  1 cup milk.   1 cup strawberries with sugar-free whipped topping.  Carbohydrate Calculation Step 1: Identify the foods that contain carbohydrates:   Rice.   Corn.   Milk.   Strawberries. Step 2:Calculate the number of servings eaten of each:   2 servings of rice.   1 serving of corn.   1 serving of milk.    1 serving of strawberries. Step 3: Multiply each of those number of servings by 15 g:   2 servings of rice x 15 g = 30 g.   1 serving of corn x 15 g = 15 g.   1 serving of milk x 15 g = 15 g.   1 serving of strawberries x 15 g = 15 g. Step 4: Add together all of the amounts to find the total grams of carbohydrates eaten: 30 g + 15 g + 15 g + 15 g = 75 g.   This information is not intended to replace advice given to you by your health care provider. Make sure you discuss any questions you have with your health care provider.   Document Released: 05/18/2005 Document Revised: 06/08/2014 Document Reviewed: 04/14/2013 Elsevier Interactive Patient Education 2016 [employer].  Diabetes and Exercise Exercising regularly is important. It is not just about losing weight. It has many health benefits, such as:  Improving your overall fitness, flexibility, and endurance.  Increasing your bone density.  Helping with weight control.  Decreasing your body fat.  Increasing your muscle strength.  Reducing stress and tension.  Improving your overall health. People with diabetes who exercise gain additional benefits because exercise:  Reduces appetite.  Improves the body's use of blood sugar (glucose).  Helps lower or control blood glucose.  Decreases blood pressure.  Helps control blood lipids (such as cholesterol and triglycerides).  Improves the body's use of the hormone insulin by:  Increasing the body's insulin sensitivity.  Reducing the body's insulin needs.  Decreases the risk for heart disease because exercising:  Lowers cholesterol and triglycerides levels.  Increases the levels of good cholesterol (such as high-density lipoproteins [HDL]) in the body.  Lowers blood glucose levels. YOUR ACTIVITY PLAN  Choose an activity that you enjoy, and set realistic goals. To exercise safely, you should begin practicing any new physical activity slowly, and gradually  increase the intensity of the exercise over time. Your health care provider or diabetes educator can help create an activity plan that works for you. General recommendations include:  Encouraging children to engage in at least 60 minutes of physical activity each day.  Stretching and performing strength training exercises, such as yoga or weight lifting, at least 2 times per week.  Performing a total of at least 150 minutes of moderate-intensity exercise each week, such as brisk walking or water aerobics.  Exercising at least 3 days per week, making sure you allow no more than 2 consecutive days to pass without exercising.  Avoiding long periods of inactivity (90 minutes or more). When you have to spend an extended period of time sitting down, take frequent breaks to walk or stretch. RECOMMENDATIONS FOR EXERCISING WITH TYPE 1 OR TYPE 2 DIABETES   Check your blood glucose before exercising. If blood glucose levels are greater than 240 mg/dL,  check for urine ketones. Do not exercise if ketones are present.  Avoid injecting insulin into areas of the body that are going to be exercised. For example, avoid injecting insulin into:  The arms when playing tennis.  The legs when jogging.  Keep a record of:  Food intake before and after you exercise.  Expected peak times of insulin action.  Blood glucose levels before and after you exercise.  The type and amount of exercise you have done.  Review your records with your health care provider. Your health care provider will help you to develop guidelines for adjusting food intake and insulin amounts before and after exercising.  If you take insulin or oral hypoglycemic agents, watch for signs and symptoms of hypoglycemia. They include:  Dizziness.  Shaking.  Sweating.  Chills.  Confusion.  Drink plenty of water while you exercise to prevent dehydration or heat stroke. Body water is lost during exercise and must be replaced.  Talk to  your health care provider before starting an exercise program to make sure it is safe for you. Remember, almost any type of activity is better than none.   This information is not intended to replace advice given to you by your health care provider. Make sure you discuss any questions you have with your health care provider.   Document Released: 08/08/2003 Document Revised: 10/02/2014 Document Reviewed: 10/25/2012 Elsevier Interactive Patient Education 2016 [place].  Diabetes and Standards of Medical Care Diabetes is complicated. You may find that your diabetes team includes a dietitian, nurse, diabetes educator, eye doctor, and more. To help everyone know what is going on and to help you get the care you deserve, the following schedule of care was developed to help keep you on track. Below are the tests, exams, vaccines, medicines, education, and plans you will need. HbA1c test This test shows how well you have controlled your glucose over the past 2-3 months. It is used to see if your diabetes management plan needs to be adjusted.   It is performed at least 2 times a year if you are meeting treatment goals.  It is performed 4 times a year if therapy has changed or if you are not meeting treatment goals. Blood pressure test  This test is performed at every routine medical visit. The goal is less than 140/90 mm Hg for most people, but 130/80 mm Hg in some cases. Ask your health care provider about your goal. Dental exam  Follow up with the dentist regularly. Eye exam  If you are diagnosed with type 1 diabetes as a child, get an exam upon reaching the age of 74 years or older and having had diabetes for 3-5 years. Yearly eye exams are recommended after that initial eye exam.  If you are diagnosed with type 1 diabetes as an adult, get an exam within 5 years of diagnosis and then yearly.  If you are diagnosed with type 2 diabetes, get an exam as soon as possible after the diagnosis and  then yearly. Foot care exam  Visual foot exams are performed at every routine medical visit. The exams check for cuts, injuries, or other problems with the feet.  You should have a complete foot exam performed every year. This exam includes an inspection of the structure and skin of your feet, a check of the pulses in your feet, and a check of the sensation in your feet.  Type 1 diabetes: The first exam is performed 5 years after diagnosis.  Type 2 diabetes: The first exam is performed at the time of diagnosis.  Check your feet nightly for cuts, injuries, or other problems with your feet. Tell your health care provider if anything is not healing. Kidney function test (urine microalbumin)  This test is performed once a year.  Type 1 diabetes: The first test is performed 5 years after diagnosis.  Type 2 diabetes: The first test is performed at the time of diagnosis.  A serum creatinine and estimated glomerular filtration rate (eGFR) test is done once a year to assess the level of chronic kidney disease (CKD), if present. Lipid profile (cholesterol, HDL, LDL, triglycerides)  Performed every 5 years for most people.  The goal for LDL is less than 100 mg/dL. If you are at high risk, the goal is less than 70 mg/dL.  The goal for HDL is 40 mg/dL-50 mg/dL for men and 50 mg/dL-60 mg/dL for women. An HDL cholesterol of 60 mg/dL or higher gives some protection against heart disease.  The goal for triglycerides is less than 150 mg/dL. Immunizations  The flu (influenza) vaccine is recommended yearly for every person 35 months of age or older who has diabetes.  The pneumonia (pneumococcal) vaccine is recommended for every person 11 years of age or older who has diabetes. Adults 55 years of age or older may receive the pneumonia vaccine as a series of two separate shots.  The hepatitis B vaccine is recommended for adults shortly after they have been diagnosed with diabetes.  The Tdap (tetanus,  diphtheria, and pertussis) vaccine should be given:  According to normal childhood vaccination schedules, for children.  Every 10 years, for adults who have diabetes. Diabetes self-management education  Education is recommended at diagnosis and ongoing as needed. Treatment plan  Your treatment plan is reviewed at every medical visit.   This information is not intended to replace advice given to you by your health care provider. Make sure you discuss any questions you have with your health care provider.   Document Released: 03/15/2009 Document Revised: 06/08/2014 Document Reviewed: 10/18/2012 Elsevier Interactive Patient Education [employer].

## 2015-11-26 NOTE — Progress Notes (Signed)
    S:    Patient arrives in good spirits.  Presents for diabetes evaluation, education, and management at the request of Dr. Janne Napoleon. Patient was referred on 11/12/15.  Patient was last seen by Primary Care Provider on 11/12/15.   Diabetes was diagnosed on 11/12/15.   Patient denies adherence with medications. She reports that the metformin caused GI upset, vomiting, and diarrhea so she stopped it for a few days. Current diabetes medications include: metformin 500 mg BID Current hypertension medications include: amlodipine 10 mg daily.  Patient denies hypoglycemic events.  Patient reported dietary habits: patient has cut back on soda intake and has only had 1.5 regular sodas in the last two weeks. She is also working on trying to eat more vegetables, including lettuce.    Patient denies nocturia.  Patient denies neuropathy. Patient denies visual changes. Patient reports self foot exams.    O:  Lab Results  Component Value Date   HGBA1C 8.1 11/12/2015   There were no vitals filed for this visit.  Home fasting CBG: 100s 2 hour post-prandial/random CBG: 100s-200s  A/P: Diabetes newly diagnosed currently uncontrolled based on A1c of 8.1. Patient denies hypoglycemic events and is able to verbalize appropriate hypoglycemia management plan. Patient denies adherence with medication. Control is suboptimal due to sedentary lifestyle and nonadherence to medication.  Discontinue metformin 1000 mg BID and switch to metformin XL 500 mg 2 tablets BID. This should help with the GI upset. Congratulated patient on progress with dietary changes and encouraged her to keep up the work.  Patient is contemplating quitting smoking. She is not ready to commit at this time but would like to discuss later this year once she is less stressed. Patient to follow up with me once she is ready.  Next A1C anticipated September 2017.    Hypertension longstanding currently uncontrolled on amlodipine 10 mg daily.   Patient reports adherence with medication. Control is suboptimal due to stress, sedentary lifestyle, and other risk factors. Continue amlodipine 10 mg daily and initiated lisinopril 10 mg daily. Potassium and Scr stable - will recheck at next visit.   Written patient instructions provided.  Total time in face to face counseling 40 minutes.  Follow up in Pharmacist Clinic Visit in 2 weeks for BP/CBG follow up..   Patient seen with Myrna Blazer, PharmD Candidate

## 2016-01-23 ENCOUNTER — Other Ambulatory Visit: Payer: Self-pay | Admitting: Internal Medicine

## 2016-05-22 ENCOUNTER — Other Ambulatory Visit: Payer: Self-pay | Admitting: Internal Medicine

## 2016-05-26 ENCOUNTER — Other Ambulatory Visit: Payer: Self-pay | Admitting: Internal Medicine

## 2016-05-26 DIAGNOSIS — Z3040 Encounter for surveillance of contraceptives, unspecified: Secondary | ICD-10-CM

## 2016-06-28 ENCOUNTER — Other Ambulatory Visit: Payer: Self-pay | Admitting: Internal Medicine

## 2016-07-01 ENCOUNTER — Other Ambulatory Visit: Payer: Self-pay | Admitting: Internal Medicine

## 2016-07-06 ENCOUNTER — Ambulatory Visit: Payer: Medicaid Other | Attending: Internal Medicine | Admitting: Internal Medicine

## 2016-07-06 ENCOUNTER — Encounter: Payer: Self-pay | Admitting: Internal Medicine

## 2016-07-06 VITALS — BP 138/90 | HR 98 | Temp 98.1°F | Resp 16 | Wt 214.0 lb

## 2016-07-06 DIAGNOSIS — I10 Essential (primary) hypertension: Secondary | ICD-10-CM

## 2016-07-06 DIAGNOSIS — Z7984 Long term (current) use of oral hypoglycemic drugs: Secondary | ICD-10-CM | POA: Diagnosis not present

## 2016-07-06 DIAGNOSIS — E1165 Type 2 diabetes mellitus with hyperglycemia: Secondary | ICD-10-CM

## 2016-07-06 DIAGNOSIS — Z111 Encounter for screening for respiratory tuberculosis: Secondary | ICD-10-CM

## 2016-07-06 DIAGNOSIS — E119 Type 2 diabetes mellitus without complications: Secondary | ICD-10-CM | POA: Diagnosis present

## 2016-07-06 LAB — BASIC METABOLIC PANEL WITH GFR
BUN: 13 mg/dL (ref 7–25)
CO2: 25 mmol/L (ref 20–31)
Calcium: 9 mg/dL (ref 8.6–10.2)
Chloride: 104 mmol/L (ref 98–110)
Creat: 0.71 mg/dL (ref 0.50–1.10)
GFR, Est Non African American: >89 mL/min (ref >=60)
GLUCOSE: 109 mg/dL — AB (ref 65–99)
POTASSIUM: 4 mmol/L (ref 3.5–5.3)
Sodium: 137 mmol/L (ref 135–146)

## 2016-07-06 LAB — CBC WITH DIFFERENTIAL/PLATELET
BASOS ABS: 0 {cells}/uL (ref 0–200)
Basophils Relative: 0 %
EOS PCT: 1 %
Eosinophils Absolute: 153 cells/uL (ref 15–500)
HCT: 38.5 % (ref 35.0–45.0)
HEMOGLOBIN: 12.6 g/dL (ref 11.7–15.5)
LYMPHS ABS: 2754 {cells}/uL (ref 850–3900)
Lymphocytes Relative: 18 %
MCH: 27.8 pg (ref 27.0–33.0)
MCHC: 32.7 g/dL (ref 32.0–36.0)
MCV: 85 fL (ref 80.0–100.0)
MPV: 9.3 fL (ref 7.5–12.5)
Monocytes Absolute: 765 cells/uL (ref 200–950)
Monocytes Relative: 5 %
NEUTROS PCT: 76 %
Neutro Abs: 11628 cells/uL — ABNORMAL HIGH (ref 1500–7800)
Platelets: 346 10*3/uL (ref 140–400)
RBC: 4.53 MIL/uL (ref 3.80–5.10)
RDW: 14.5 % (ref 11.0–15.0)
WBC: 15.3 10*3/uL — ABNORMAL HIGH (ref 3.8–10.8)

## 2016-07-06 MED ORDER — TUBERCULIN PPD 5 UNIT/0.1ML ID SOLN: 5.0000 [IU] | Freq: Once | INTRADERMAL | Status: DC

## 2016-07-06 MED ORDER — AMLODIPINE BESYLATE 10 MG PO TABS: 10.0000 mg | ORAL_TABLET | Freq: Every day | ORAL | 3 refills | Status: DC

## 2016-07-06 MED ORDER — METFORMIN HCL ER 500 MG PO TB24: 500.0000 mg | ORAL_TABLET | Freq: Two times a day (BID) | ORAL | 3 refills | Status: DC

## 2016-07-06 MED ORDER — LISINOPRIL 10 MG PO TABS: 10.0000 mg | ORAL_TABLET | Freq: Every day | ORAL | 3 refills | Status: DC

## 2016-07-06 MED ORDER — CLONIDINE HCL 0.1 MG PO TABS: 0.2000 mg | ORAL_TABLET | Freq: Once | ORAL | Status: DC

## 2016-07-06 NOTE — Progress Notes (Signed)
Samantha [name], is a 36 y.o. female  QMV:[id number]  MWU:[id number]  DOB - 1980/08/28  Chief Complaint  Patient presents with  . Medication Refill        Subjective:   Samantha [name] is a 36 y.o. female here today for a follow up visit for dm and htn. Last seen 11/12/15.  She ran out of her bp meds about 1 wks ago, still taking 1011m metformin bid.  She started feeling unwell last couple days w/ mild ha since she ran out of meds. No photophobia/visual disturbances.  Her 2 young sons are with her today. 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  No problems updated.  ALLERGIES: No Known Allergies  PAST MEDICAL HISTORY: Past Medical History:  Diagnosis Date  . Hypertension     MEDICATIONS AT HOME: Prior to Admission medications   Medication Sig Start Date End Date Taking? Authorizing Provider  amLODipine (NORVASC) 10 MG tablet Take 1 tablet (10 mg total) by mouth daily. Must have office visit for refills 07/06/16   DMaren Reamer MD  Blood Glucose Monitoring Suppl (ACCU-CHEK AVIVA PLUS) w/Device KIT 1 application by Does not apply route QID. 11/12/15   DMaren Reamer MD  furosemide (LASIX) 20 MG tablet Take 1 tablet (20 mg total) by mouth daily. 11/12/15   DMaren Reamer MD  glucose blood (ACCU-CHEK COMPACT STRIPS) test strip Use as instructed 11/12/15   DMaren Reamer MD  ibuprofen (ADVIL,MOTRIN) 800 MG tablet Take 1 tablet (800 mg total) by mouth every 8 (eight) hours as needed for mild pain. 01/23/15   Kristen N Ward, DO  Lancets (ACCU-CHEK SOFT TOUCH) lancets Use as instructed 11/12/15   DMaren Reamer MD  lisinopril (PRINIVIL,ZESTRIL) 10 MG tablet Take 1 tablet (10 mg total) by mouth daily. 07/06/16   DMaren Reamer MD  metFORMIN (GLUCOPHAGE-XR) 500 MG 24 hr tablet Take 1 tablet  (500 mg total) by mouth 2 (two) times daily with a meal. 07/06/16   DMaren Reamer MD  NUVARING 0.12-0.015 MG/24HR vaginal ring INSERT 1 RING VAGINALLY EVERY 28 DAYS, LEAVE IN PLACE FOR 3 CONSECUTIVE WEEKS, THEN REMOVE FOR 1 WEEK 05/27/16   DMaren Reamer MD     Objective:   Vitals:   07/06/16 1547  BP: (!) 157/11  Pulse: 98  Resp: 16  Temp: 98.1 F (36.7 C)  TempSrc: Oral  SpO2: 98%  Weight: 214 lb (97.1 kg)   226 lbs 11/12/15  Exam General appearance : Awake, alert, not in any distress. Speech Clear. Not toxic looking, morbid obese. HEENT: Atraumatic and Normocephalic, pupils equally reactive to light. Neck: supple, no JVD.   Chest:Good air entry bilaterally, no added sounds. CVS: S1 S2 regular, no murmurs/gallups or rubs. Abdomen: Bowel sounds active, obese, Non tender and not distended with no gaurding, rigidity or rebound. Foot exam: bilateral peripheral pulses 2+ (dorsalis pedis and post tibialis pulses), no ulcers noted/no ecchymosis, warm to touch, monofilament testing 2/3 bilat. Sensation intact.  No c/c/e. Neurology: Awake alert, and oriented X 3, CN II-XII grossly intact, Non focal Skin:No Rash  Data Review Lab Results  Component Value Date   HGBA1C 8.1 11/12/2015    Depression screen PHQ 2/9 07/06/2016 11/12/2015  Decreased Interest 0 0  Down, Depressed, Hopeless 0 0  PHQ - 2 Score 0 0  Altered sleeping - 0  Tired, decreased energy - 0  Change in appetite - 0  Feeling bad or failure about yourself  - 0  Trouble concentrating - 0  Moving slowly or fidgety/restless - 0  Suicidal thoughts - 0  PHQ-9 Score - 0  Difficult doing work/chores - Not difficult at all      Assessment & Plan   1. Type 2 diabetes mellitus with hyperglycemia, without long-term current use of insulin (HCC) - much better controlled, recd reducing metformin to 500bid for now. - a1c 6.1, cbg 134 today - Ambulatory referral to Ophthalmology - BASIC METABOLIC PANEL WITH GFR - CBC  with Differential - Microalbumin/Creatinine Ratio, Urine  2. Essential hypertension Uncontrolled, renewed same rx for now., repeat bp after clonidine 138/90 - ran out of meds x 1 wks - clonidine 0.96m x 1 today - RN Travia bp check in 2 wks, if spb >130, than dc lisinopril 10, and start prinzide 10-12.5 qd, continue norvasc 10 qd.  3. Visit for TB skin test No tb skin test due to cost for now.  4. Pt declined flu vac and pneumococcal 23 vac Declined tdap.   Patient have been counseled extensively about nutrition and exercise  Return in about 3 months (around 10/03/2016), or if symptoms worsen or fail to improve, for pap smear in month if has not had in 3years.  The patient was given clear instructions to go to ER or return to medical center if symptoms don't improve, worsen or new problems develop. The patient verbalized understanding. The patient was told to call to get lab results if they haven't heard anything in the next week.   This note has been created with DSurveyor, quantity Any transcriptional errors are unintentional.   DMaren Reamer MD, MNorth [facility]andaand [facility] Center[facility]Na[facility]  07/06/2016, 4:00 PM

## 2016-07-06 NOTE — Patient Instructions (Addendum)
Travia 2 wks RN bp check. 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  QUICK START PATIENT GUIDE TO LCHF/IF LOW CARB HIGH FAT / INTERMITTENT FASTING  Recommend: <50 gram carbohydrate a day for weightloss.  What is this diet and how does it work? o Insulin is a hormone made by your body that allows you to use sugar (glucose) from carbohydrates in the food you eat for energy or to store glucose (as fat) for future use  o Insulin levels need to be lowered in order to utilize our stored energy (fat) o Many struggling with obesity are insulin resistant and have high levels of insulin o This diet works to lower your insulin in two ways o Fasting - allows your insulin levels to naturally decrease  o Avoiding carbohydrates - carbs trigger increase in insulin Low Carb Healthy Fat (LCHF) o Get a free app for your phone, such as MyFitnessPal, to help you track your macronutrients (carbs/protein/fats) and to track your weight and body measurements to see your progress o Set your goal for around 10% carbs/20% protein/70% fat o A good starting goal for amount of net carbs per day is 50 grams (some will aim for 20 grams) o "Net carbs" refers to total grams of carbs minus grams of fiber (as fiber is not typically absorbed). For example, if a food has 5g total carb and 3g fiber, that would be 2g net carbs o Increase healthy fats - eg. olive oil, eggs, nuts, avocado, cheese, butter, coconut, meats, fish o Avoid high carb foods - eg. bread, pasta, potatoes, rice, cookies, soda, juice, anything sugary o Buy full-fat ingredients (avoid low-fat versions, which often have more sugar) o No need to count calories, but pay close attention to grams of carbs on labels Intermittent Fasting (IF) o "Fasting" is going a period of time without eating - it helps to stay busy and well-hydrated o Purpose of fasting is to allow insulin levels to  drop as low as possible, allowing your body to switch into fat-burning mode o With this diet there are many approaches to fasting, but 16:8 and 24hr fasts are commonly used o 16:8 fast, usually 5-7 days a week - Fasting for 16 hours of the day, then eating all meals for the day over course of 8 hours. o 24 hour fast, usually 1-3 days a week - Typically eating one meal a day, then fasting until the next day. Plenty of fluids (and some salt to help you hold onto fluids) are recommended during longer fasts.  o During fasts certain beverages are still acceptable - water, sparkling water, bone broth, black tea or coffee, or tea/coffee with small amount of heavy whipping cream Special note for those on diabetic medications o Discuss your medications with your physician. You may need to hold your medication or adjust to only taking when eating. Diabetics should keep close track of their blood sugars when making any changes to diet/meds, to ensure they are staying within normal limits For more info about LCHF/IF o Watch "Therapeutic Fasting - Solving the Two-Compartment Problem" video by Dr. Sharman Cheek on YouTube (GreatestGyms.com.ee) for a great intro to these concepts o Read "The Obesity Code" and/or "The Complete Guide to Fasting" by Dr. Sharman Cheek o Go to www.dietdoctor.com for explanations, recipes, and infographics about foods to eat/avoid o Get a Free smartphone app that helps count carbohydrates  -  ie MyKeto EXAMPLES TO GET STARTED Fasting Beverages -water (can add  tsp Pink Himalayan salt once or twice a day to help stay hydrated for longer fasts) -Sparkling water (such as [employer] or similar; avoid any with artificial sweeteners)  -Bone broth (multiple recipes available online or can buy pre-made) -Tea or Coffee (Adding heavy whipping cream or coconut oil to your tea or coffee can be helpful if you find yourself getting too hungry during the fasts. Can also add cinnamon  for flavor. Or "bulletproof coffee.") Low Carb Healthy Fat Breakfast (if not fasting) -eggs in butter or olive oil with avocado -omelet with veggies and cheese  Lunch -hamburger with cheese and avocado wrapped in lettuce (no bun, no ketchup) -meat and cheese wrapped in lettuce (can dip in mustard or olive oil/vinegar/mayo) -salad with meat/cheese/nuts and higher fat dressing (vinaigrette or Ranch, etc) -tuna salad lettuce wrap -taco meat with cheese, sour cream, guacamole, cheese over lettuce  Dinner -steak with herb butter or [place] sauce -"Fathead" pizza (uses cheese and almond flour for the dough - several recipes available online) -roasted or grilled chicken with skin on, with low carb sauce (buffalo, garlic butter, alfredo, pesto, etc) -baked salmon with lemon butter -chicken alfredo with zucchini noodles -[place] butter chicken with low carb garlic naan -egg roll in a bowl  Side Dishes -mashed cauliflower (homemade or available in freezer section) -roast vegetables (green veggies that grow above ground rather than root veggies) with butter or cheese -Caprese salad (fresh mozzarella, tomato and basil with olive oil) -homemade low-carb coleslaw Snacks/Desserts (try to avoid unnecessary snacking and sweets in general) -celery or cucumber dipped in guacamole or sour cream dip -cheese and meat slices  -raspberries with whipped cream (can make homemade with no sugar added) -low carb [place] butter cake  AVOID - sugar, diet/regular soda, potatoes, breads, rice, pasta, candy, cookies, cakes, muffins, juice, high carb fruit (bananas, grapes), beer, ketchup, barbeque and other sweet sauces  -   Low-Sodium Eating Plan Sodium raises blood pressure and causes water to be held in the body. Getting less sodium from food will help lower your blood pressure, reduce any swelling, and protect your heart, liver, and kidneys. We get sodium by adding salt (sodium chloride) to food. Most of  our sodium comes from canned, boxed, and frozen foods. Restaurant foods, fast foods, and pizza are also very high in sodium. Even if you take medicine to lower your blood pressure or to reduce fluid in your body, getting less sodium from your food is important. What is my plan? Most people should limit their sodium intake to 2,300 mg a day. Your health care provider recommends that you limit your sodium intake to 000mg  a day. What do I need to know about this eating plan? For the low-sodium eating plan, you will follow these general guidelines:  Choose foods with a % Daily Value for sodium of less than 5% (as listed on the food label).  Use salt-free seasonings or herbs instead of table salt or sea salt.  Check with your health care provider or pharmacist before using salt substitutes.  Eat fresh foods.  Eat more vegetables and fruits.  Limit canned vegetables. If you do use them, rinse them well to decrease the sodium.  Limit cheese to 1 oz (28 g) per day.  Eat lower-sodium products, often labeled as "lower sodium" or "no salt added."  Avoid foods that contain monosodium glutamate (MSG). MSG is sometimes added to [place] food and some canned  foods.  Check food labels (Nutrition Facts labels) on foods to learn how much sodium is in one serving.  Eat more home-cooked food and less restaurant, buffet, and fast food.  When eating at a restaurant, ask that your food be prepared with less salt, or no salt if possible. How do I read food labels for sodium information? The Nutrition Facts label lists the amount of sodium in one serving of the food. If you eat more than one serving, you must multiply the listed amount of sodium by the number of servings. Food labels may also identify foods as:  Sodium free-Less than 5 mg in a serving.  Very low sodium-35 mg or less in a serving.  Low sodium-140 mg or less in a serving.  Light in sodium-50% less sodium in a serving. For example, if a  food that usually has 300 mg of sodium is changed to become light in sodium, it will have 150 mg of sodium.  Reduced sodium-25% less sodium in a serving. For example, if a food that usually has 400 mg of sodium is changed to reduced sodium, it will have 300 mg of sodium. What foods can I eat? Grains  Low-sodium cereals, including oats, puffed wheat and rice, and shredded wheat cereals. Low-sodium crackers. Unsalted rice and pasta. Lower-sodium bread. Vegetables  Frozen or fresh vegetables. Low-sodium or reduced-sodium canned vegetables. Low-sodium or reduced-sodium tomato sauce and paste. Low-sodium or reduced-sodium tomato and vegetable juices. Fruits  Fresh, frozen, and canned fruit. Fruit juice. Meat and Other Protein Products  Low-sodium canned tuna and salmon. Fresh or frozen meat, poultry, seafood, and fish. Lamb. Unsalted nuts. Dried beans, peas, and lentils without added salt. Unsalted canned beans. Homemade soups without salt. Eggs. Dairy  Milk. Soy milk. Ricotta cheese. Low-sodium or reduced-sodium cheeses. Yogurt. Condiments  Fresh and dried herbs and spices. Salt-free seasonings. Onion and garlic powders. Low-sodium varieties of mustard and ketchup. Fresh or refrigerated horseradish. Lemon juice. Fats and Oils  Reduced-sodium salad dressings. Unsalted butter. Other  Unsalted popcorn and pretzels. The items listed above may not be a complete list of recommended foods or beverages. Contact your dietitian for more options.  What foods are not recommended? Grains  Instant hot cereals. Bread stuffing, pancake, and biscuit mixes. Croutons. Seasoned rice or pasta mixes. Noodle soup cups. Boxed or frozen macaroni and cheese. Self-rising flour. Regular salted crackers. Vegetables  Regular canned vegetables. Regular canned tomato sauce and paste. Regular tomato and vegetable juices. Frozen vegetables in sauces. Salted [place] fries. Olives. Angie Fava. Relishes. Sauerkraut. Salsa. Meat and  Other Protein Products  Salted, canned, smoked, spiced, or pickled meats, seafood, or fish. Bacon, ham, sausage, hot dogs, corned beef, chipped beef, and packaged luncheon meats. Salt pork. Jerky. Pickled herring. Anchovies, regular canned tuna, and sardines. Salted nuts. Dairy  Processed cheese and cheese spreads. Cheese curds. Blue cheese and cottage cheese. Buttermilk. Condiments  Onion and garlic salt, seasoned salt, table salt, and sea salt. Canned and packaged gravies. Worcestershire sauce. Tartar sauce. Barbecue sauce. Teriyaki sauce. Soy sauce, including reduced sodium. Steak sauce. Fish sauce. Oyster sauce. Cocktail sauce. Horseradish that you find on the shelf. Regular ketchup and mustard. Meat flavorings and tenderizers. Bouillon cubes. Hot sauce. Tabasco sauce. Marinades. Taco seasonings. Relishes. Fats and Oils  Regular salad dressings. Salted butter. Margarine. Ghee. Bacon fat. Other  Potato and tortilla chips. Corn chips and puffs. Salted popcorn and pretzels. Canned or dried soups. Pizza. Frozen entrees and pot pies. The items listed above may not  be a complete list of foods and beverages to avoid. Contact your dietitian for more information.  This information is not intended to replace advice given to you by your health care provider. Make sure you discuss any questions you have with your health care provider. Document Released: 11/07/2001 Document Revised: 10/24/2015 Document Reviewed: 03/22/2013 Elsevier Interactive Patient Education  2017 Elsevier Inc.  -  Hypertension Hypertension is another name for high blood pressure. High blood pressure forces your heart to work harder to pump blood. A blood pressure reading has two numbers, which includes a higher number over a lower number (example: 110/72). Follow these instructions at home:  Have your blood pressure rechecked by your doctor.  Only take medicine as told by your doctor. Follow the directions carefully. The medicine  does not work as well if you skip doses. Skipping doses also puts you at risk for problems.  Do not smoke.  Monitor your blood pressure at home as told by your doctor. Contact a doctor if:  You think you are having a reaction to the medicine you are taking.  You have repeat headaches or feel dizzy.  You have puffiness (swelling) in your ankles.  You have trouble with your vision. Get help right away if:  You get a very bad headache and are confused.  You feel weak, numb, or faint.  You get chest or belly (abdominal) pain.  You throw up (vomit).  You cannot breathe very well. This information is not intended to replace advice given to you by your health care provider. Make sure you discuss any questions you have with your health care provider. Document Released: 11/04/2007 Document Revised: 10/24/2015 Document Reviewed: 03/10/2013 Elsevier Interactive Patient Education  2017 [employer].

## 2016-07-07 LAB — MICROALBUMIN / CREATININE URINE RATIO
Creatinine, Urine: 122 mg/dL (ref 20–320)
MICROALB UR: 0.7 mg/dL
Microalb Creat Ratio: 6 mcg/mg creat (ref <30)

## 2016-07-08 ENCOUNTER — Other Ambulatory Visit: Payer: Self-pay | Admitting: Internal Medicine

## 2016-07-08 DIAGNOSIS — D72829 Elevated white blood cell count, unspecified: Secondary | ICD-10-CM

## 2016-07-09 ENCOUNTER — Telehealth: Payer: Self-pay

## 2016-07-09 NOTE — Telephone Encounter (Signed)
Contacted pt to go over lab results pt didn't answer lvm asking pt to give me a call at her earliest convenience  

## 2016-07-09 NOTE — Telephone Encounter (Signed)
Pt returned call pt to go over lab results pt is aware for results. pt states she has severe anemia and she does have the sickle cell trait. Pt states she did have some itching down there but no discharge or frequent urination

## 2016-07-13 NOTE — Telephone Encounter (Signed)
Amy Could you call and schedule pt a lab visit for a Urinalysis

## 2016-07-13 NOTE — Telephone Encounter (Signed)
Ok. Please have her due the UA. Thanks.

## 2016-07-17 ENCOUNTER — Other Ambulatory Visit: Payer: Self-pay

## 2016-09-02 ENCOUNTER — Emergency Department (HOSPITAL_COMMUNITY)
Admission: EM | Admit: 2016-09-02 | Discharge: 2016-09-02 | Disposition: A | Discharge status: Home / Self Care | Payer: Self-pay | Attending: Emergency Medicine | Admitting: Emergency Medicine

## 2016-09-02 ENCOUNTER — Emergency Department (HOSPITAL_COMMUNITY): Payer: Self-pay

## 2016-09-02 ENCOUNTER — Encounter (HOSPITAL_COMMUNITY): Payer: Self-pay | Admitting: *Deleted

## 2016-09-02 ENCOUNTER — Telehealth: Payer: Self-pay | Admitting: *Deleted

## 2016-09-02 DIAGNOSIS — Z7984 Long term (current) use of oral hypoglycemic drugs: Secondary | ICD-10-CM | POA: Insufficient documentation

## 2016-09-02 DIAGNOSIS — F17210 Nicotine dependence, cigarettes, uncomplicated: Secondary | ICD-10-CM | POA: Insufficient documentation

## 2016-09-02 DIAGNOSIS — R059 Cough, unspecified: Secondary | ICD-10-CM

## 2016-09-02 DIAGNOSIS — R0789 Other chest pain: Secondary | ICD-10-CM | POA: Insufficient documentation

## 2016-09-02 DIAGNOSIS — R0602 Shortness of breath: Secondary | ICD-10-CM | POA: Insufficient documentation

## 2016-09-02 DIAGNOSIS — J40 Bronchitis, not specified as acute or chronic: Secondary | ICD-10-CM | POA: Insufficient documentation

## 2016-09-02 DIAGNOSIS — I10 Essential (primary) hypertension: Secondary | ICD-10-CM | POA: Insufficient documentation

## 2016-09-02 DIAGNOSIS — E119 Type 2 diabetes mellitus without complications: Secondary | ICD-10-CM | POA: Insufficient documentation

## 2016-09-02 DIAGNOSIS — R05 Cough: Secondary | ICD-10-CM

## 2016-09-02 LAB — BASIC METABOLIC PANEL
ANION GAP: 9 (ref 5–15)
BUN: 7 mg/dL (ref 6–20)
CHLORIDE: 104 mmol/L (ref 101–111)
CO2: 23 mmol/L (ref 22–32)
Calcium: 9 mg/dL (ref 8.9–10.3)
Creatinine, Ser: 0.7 mg/dL (ref 0.44–1.00)
GFR calc non Af Amer: >60 mL/min (ref >60)
GLUCOSE: 204 mg/dL — AB (ref 65–99)
POTASSIUM: 3.5 mmol/L (ref 3.5–5.1)
Sodium: 136 mmol/L (ref 135–145)

## 2016-09-02 LAB — I-STAT TROPONIN, ED: TROPONIN I, POC: 0 ng/mL (ref 0.00–0.08)

## 2016-09-02 LAB — CBC
HEMATOCRIT: 38.7 % (ref 36.0–46.0)
HEMOGLOBIN: 13.2 g/dL (ref 12.0–15.0)
MCH: 28.3 pg (ref 26.0–34.0)
MCHC: 34.1 g/dL (ref 30.0–36.0)
MCV: 83 fL (ref 78.0–100.0)
Platelets: 334 10*3/uL (ref 150–400)
RBC: 4.66 MIL/uL (ref 3.87–5.11)
RDW: 13.8 % (ref 11.5–15.5)
WBC: 11 10*3/uL — ABNORMAL HIGH (ref 4.0–10.5)

## 2016-09-02 MED ORDER — BENZONATATE 100 MG PO CAPS: 100.0000 mg | ORAL_CAPSULE | Freq: Three times a day (TID) | ORAL | 0 refills | Status: DC

## 2016-09-02 NOTE — ED Provider Notes (Signed)
[facility] DEPT Provider Note   CSN: [id number] Arrival date & time: 09/02/16  1010  By signing my name below, I, Neta Mends, attest that this documentation has been prepared under the direction and in the presence of Fatima Blank, MD . Electronically Signed: Neta Mends, ED Scribe. 09/02/2016. 1:06 PM.    History   Chief Complaint Chief Complaint  Patient presents with  . Cough  . Shortness of Breath    The history is provided by the patient. No language interpreter was used.   HPI Comments:  Samantha [name] is a 36 y.o. female with PMHx of HTN who presents to the Emergency Department complaining of a persistent cough x 2 days. She states that the cough is productive of clear sputum. Pt complains of associated SOB, wheezing, rhinorrhea, chest pain. She states that the chest pain is intermittent and lasts in 10-15 second episodes located on the left sternal border. Pain associated with coughing and palpation of this area. She denies any previous similar symptoms. Denies hx of asthma or DVT/PE. 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 She has taken ibuprofen with moderate relief. Denies fever, leg swelling, abdominal pain, diarrhea, nausea.  Past Medical History:  Diagnosis Date  . Hypertension     Patient Active Problem List   Diagnosis Date Noted  . Morbid obesity ([place]) 07/06/2016  . Type 2 diabetes mellitus with hyperglycemia ([place]) 11/12/2015  . Essential hypertension 11/04/2014    History reviewed. No pertinent surgical history.  OB History    Gravida Para Term Preterm AB Living   _0 SAB TAB Ectopic Multiple Live Births   1               Home Medications    Prior to Admission medications   Medication Sig Start Date End Date Taking? Authorizing Provider  amLODipine (NORVASC) 10 MG tablet Take 1 tablet (10 mg total) by mouth daily. Must have office visit for refills 07/06/16   Maren Reamer, MD  benzonatate  (TESSALON) 100 MG capsule Take 1 capsule (100 mg total) by mouth every 8 (eight) hours. 09/02/16   Fatima Blank, MD  Blood Glucose Monitoring Suppl (ACCU-CHEK AVIVA PLUS) w/Device KIT 1 application by Does not apply route QID. 11/12/15   Maren Reamer, MD  furosemide (LASIX) 20 MG tablet Take 1 tablet (20 mg total) by mouth daily. 11/12/15   Maren Reamer, MD  glucose blood (ACCU-CHEK COMPACT STRIPS) test strip Use as instructed 11/12/15   Maren Reamer, MD  ibuprofen (ADVIL,MOTRIN) 800 MG tablet Take 1 tablet (800 mg total) by mouth every 8 (eight) hours as needed for mild pain. 01/23/15   Kristen N Ward, DO  Lancets (ACCU-CHEK SOFT TOUCH) lancets Use as instructed 11/12/15   Maren Reamer, MD  lisinopril (PRINIVIL,ZESTRIL) 10 MG tablet Take 1 tablet (10 mg total) by mouth daily. 07/06/16   Maren Reamer, MD  metFORMIN (GLUCOPHAGE-XR) 500 MG 24 hr tablet Take 1 tablet (500 mg total) by mouth 2 (two) times daily with a meal. 07/06/16   Maren Reamer, MD  NUVARING 0.12-0.015 MG/24HR vaginal ring INSERT 1 RING VAGINALLY EVERY 28 DAYS, LEAVE IN PLACE FOR 3 CONSECUTIVE WEEKS, THEN REMOVE FOR 1 WEEK 05/27/16   Maren Reamer, MD    Family History Family History  Problem Relation Age of Onset  . Cancer Father     Social History Social  History  Substance Use Topics  . Smoking status: Current Every Day Smoker    Packs/day: 0.50    Years: 0.40    Types: Cigarettes  . Smokeless tobacco: Never Used  . Alcohol use No     Allergies   Patient has no known allergies.   Review of Systems Review of Systems 10 Systems reviewed and are negative for acute change except as noted in the HPI.   Physical Exam Updated Vital Signs BP (!) 146/96 (BP Location: Left Arm)   Pulse (!) 113   Temp 99.1 F (37.3 C) (Oral)   Resp 16   Ht _0  (1.6 m)   Wt 200 lb (90.7 kg)   SpO2 95%   BMI 35.43 kg/m   Physical Exam  Constitutional: She is oriented to person, place, and time. She  appears well-developed and well-nourished. No distress.  HENT:  Head: Normocephalic and atraumatic.  Nose: Nose normal.  No post nasal drip of sign of infection.  Eyes: Conjunctivae and EOM are normal. Pupils are equal, round, and reactive to light. Right eye exhibits no discharge. Left eye exhibits no discharge. No scleral icterus.  Neck: Normal range of motion. Neck supple.  Cardiovascular: Normal rate and regular rhythm.  Exam reveals no gallop and no friction rub.   No murmur heard. HR ~100 bpm palpated  Pulmonary/Chest: Effort normal and breath sounds normal. No stridor. No respiratory distress. She has no wheezes. She has no rales. She exhibits tenderness (complete reproduction of pain with palpation to noted area).    Abdominal: Soft. She exhibits no distension. There is no tenderness.  Musculoskeletal: She exhibits no edema or tenderness.  Neurological: She is alert and oriented to person, place, and time.  Skin: Skin is warm and dry. No rash noted. She is not diaphoretic. No erythema.  Psychiatric: She has a normal mood and affect.  Vitals reviewed.    ED Treatments / Results  DIAGNOSTIC STUDIES:  Oxygen Saturation is 95% on RA, normal by my interpretation.    COORDINATION OF CARE:  12:58 PM Discussed treatment plan with pt at bedside and pt agreed to plan.   Labs (all labs ordered are listed, but only abnormal results are displayed) Labs Reviewed  BASIC METABOLIC PANEL - Abnormal; Notable for the following:       Result Value   Glucose, Bld 204 (*)    All other components within normal limits  CBC - Abnormal; Notable for the following:    WBC 11.0 (*)    All other components within normal limits  I-STAT TROPOININ, ED    EKG  EKG Interpretation  Date/Time:  Wednesday September 02 2016 10:16:08 EDT Ventricular Rate:  111 PR Interval:  136 QRS Duration: 84 QT Interval:  342 QTC Calculation: 465 R Axis:   52 Text Interpretation:  Sinus tachycardia Otherwise  normal ECG Otherwise no significant change Confirmed by [place] ([id number]) on 09/02/2016 12:59:43 PM       Radiology Dg Chest 2 View  Result Date: 09/02/2016 CLINICAL DATA:  Chest pain, shortness of breath. EXAM: CHEST  2 VIEW COMPARISON:  Radiographs of January 22, 2015. FINDINGS: The heart size and mediastinal contours are within normal limits. Both lungs are clear. No pneumothorax or pleural effusion is noted. The visualized skeletal structures are unremarkable. IMPRESSION: No active cardiopulmonary disease. Electronically Signed   By: Marijo Conception, M.D.   On: 09/02/2016 10:56    Procedures Procedures (including critical care time)  Medications Ordered  in ED Medications - No data to display   Initial Impression / Assessment and Plan / ED Course  I have reviewed the triage vital signs and the nursing notes.  Pertinent labs & imaging results that were available during my care of the patient were reviewed by me and considered in my medical decision making (see chart for details).     Atypical chest pain highly inconsistent with ACS. EKG without acute ischemic changes or evidence of pericarditis. Initial troponin drawn in triage negative. Do not feel that additional cardiac workup is required at this time. Chest x-ray without evidence suggestive of pneumonia, pneumothorax, pneumomediastinum.  No abnormal contour of the mediastinum to suggest dissection. No evidence of acute injuries. Low pretest probability for pulmonary embolism. Presentation is not classic for aortic dissection or esophageal perforation.  Presentation is most consistent with likely viral bronchitis with associated chest wall pain secondary to coughing. No wheezing, or increase work of breathing noted on exam.   The patient is safe for discharge with strict return precautions.   Final Clinical Impressions(s) / ED Diagnoses   Final diagnoses:  Cough  Shortness of breath  Bronchitis  Chest wall pain    Disposition: Discharge  Condition: Good  I have discussed the results, Dx and Tx plan with the patient who expressed understanding and agree(s) with the plan. Discharge instructions discussed at great length. The patient was given strict return precautions who verbalized understanding of the instructions. No further questions at time of discharge.    New Prescriptions   BENZONATATE (TESSALON) 100 MG CAPSULE    Take 1 capsule (100 mg total) by mouth every 8 (eight) hours.    Follow Up: Maren Reamer, MD [place] [id number] [phone]  Schedule an appointment as soon as possible for a visit  in 5-7 days, If symptoms do not improve or  worsen   I personally performed the services described in this documentation, which was scribed in my presence. The recorded information has been reviewed and is accurate.        Fatima Blank, MD 09/02/16 [phone]

## 2016-09-02 NOTE — ED Triage Notes (Signed)
Pt reports having recent cough and sob. Chest pain occurs when breathing and coughing. Reports wheezing, denies hx of asthma. No resp distress is noted at triage.

## 2016-09-02 NOTE — Telephone Encounter (Addendum)
Reason for walk- in- wheezing, SOB, cough Sx's onset 2 days ago. Pt states she has cough, productive, does not know color of phlegm. Has runny nose.  Has not tried OTC medications.  Denies fever. 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  Pt A&O, NAD, Breathing is WNL. Pt is not tachypneic, able to speak in full sentences. No DOE assessed walking from lobby to room for assessment. Lungs sounds are clear upon assessment. No wheezing or rhonchi auscultated.  VS: T: 98.9 P:98 R:20  BP:139/89 SpO2:100   No apt. available today. Pt informed of apt availability on 09/03/16. Pt Pt. did not want to schedule apt. Encourage to go to urgent care or ED for worsening concerns

## 2016-09-09 ENCOUNTER — Encounter: Payer: Self-pay | Admitting: Internal Medicine

## 2016-09-09 ENCOUNTER — Ambulatory Visit: Payer: Self-pay | Attending: Internal Medicine | Admitting: Internal Medicine

## 2016-09-09 VITALS — BP 137/89 | HR 94 | Temp 98.3°F | Resp 16 | Wt 211.8 lb

## 2016-09-09 DIAGNOSIS — Z0000 Encounter for general adult medical examination without abnormal findings: Secondary | ICD-10-CM

## 2016-09-09 DIAGNOSIS — I10 Essential (primary) hypertension: Secondary | ICD-10-CM | POA: Insufficient documentation

## 2016-09-09 DIAGNOSIS — F17210 Nicotine dependence, cigarettes, uncomplicated: Secondary | ICD-10-CM | POA: Insufficient documentation

## 2016-09-09 DIAGNOSIS — Z23 Encounter for immunization: Secondary | ICD-10-CM | POA: Insufficient documentation

## 2016-09-09 DIAGNOSIS — Z7984 Long term (current) use of oral hypoglycemic drugs: Secondary | ICD-10-CM | POA: Insufficient documentation

## 2016-09-09 DIAGNOSIS — E669 Obesity, unspecified: Secondary | ICD-10-CM | POA: Insufficient documentation

## 2016-09-09 DIAGNOSIS — Z79899 Other long term (current) drug therapy: Secondary | ICD-10-CM | POA: Insufficient documentation

## 2016-09-09 DIAGNOSIS — Z720 Tobacco use: Secondary | ICD-10-CM

## 2016-09-09 DIAGNOSIS — E1165 Type 2 diabetes mellitus with hyperglycemia: Secondary | ICD-10-CM | POA: Insufficient documentation

## 2016-09-09 DIAGNOSIS — R197 Diarrhea, unspecified: Secondary | ICD-10-CM | POA: Insufficient documentation

## 2016-09-09 DIAGNOSIS — J40 Bronchitis, not specified as acute or chronic: Secondary | ICD-10-CM | POA: Insufficient documentation

## 2016-09-09 DIAGNOSIS — Z114 Encounter for screening for human immunodeficiency virus [HIV]: Secondary | ICD-10-CM | POA: Insufficient documentation

## 2016-09-09 DIAGNOSIS — K5900 Constipation, unspecified: Secondary | ICD-10-CM | POA: Insufficient documentation

## 2016-09-09 LAB — POCT GLYCOSYLATED HEMOGLOBIN (HGB A1C): HEMOGLOBIN A1C: 6.1

## 2016-09-09 LAB — GLUCOSE, POCT (MANUAL RESULT ENTRY): POC Glucose: 218 mg/dl — AB (ref 70–99)

## 2016-09-09 MED ORDER — METFORMIN HCL ER 500 MG PO TB24: 500.0000 mg | ORAL_TABLET | Freq: Two times a day (BID) | ORAL | 3 refills | Status: DC

## 2016-09-09 MED ORDER — LISINOPRIL-HYDROCHLOROTHIAZIDE 10-12.5 MG PO TABS: 1.0000 | ORAL_TABLET | Freq: Every day | ORAL | 3 refills | Status: DC

## 2016-09-09 MED ORDER — ALBUTEROL SULFATE HFA 108 (90 BASE) MCG/ACT IN AERS: 2.0000 | INHALATION_SPRAY | Freq: Four times a day (QID) | RESPIRATORY_TRACT | 2 refills | Status: DC | PRN

## 2016-09-09 MED ORDER — ETONOGESTREL-ETHINYL ESTRADIOL 0.12-0.015 MG/24HR VA RING: VAGINAL_RING | VAGINAL | 3 refills | Status: DC

## 2016-09-09 MED ORDER — AMLODIPINE BESYLATE 10 MG PO TABS: 10.0000 mg | ORAL_TABLET | Freq: Every day | ORAL | 3 refills | Status: DC

## 2016-09-09 NOTE — Patient Instructions (Addendum)
For your tobacco abuse: Strongly recommend that he stop smoking back and all other inhaled products right away Call 1-800-Quit-Now to get free nicotine replacement from the state of [place]  -   Steps to Quit Smoking Smoking tobacco can be bad for your health. It can also affect almost every organ in your body. Smoking puts you and people around you at risk for many serious long-lasting (chronic) diseases. Quitting smoking is hard, but it is one of the best things that you can do for your health. It is never too late to quit. What are the benefits of quitting smoking? When you quit smoking, you lower your risk for getting serious diseases and conditions. They can include:  Lung cancer or lung disease.  Heart disease.  Stroke.  Heart attack.  Not being able to have children (infertility).  Weak bones (osteoporosis) and broken bones (fractures). If you have coughing, wheezing, and shortness of breath, those symptoms may get better when you quit. You may also get sick less often. If you are pregnant, quitting smoking can help to lower your chances of having a baby of low birth weight. What can I do to help me quit smoking? Talk with your doctor about what can help you quit smoking. Some things you can do (strategies) include:  Quitting smoking totally, instead of slowly cutting back how much you smoke over a period of time.  Going to in-person counseling. You are more likely to quit if you go to many counseling sessions.  Using resources and support systems, such as:  Online chats with a Social worker.  Phone quitlines.  Printed Furniture conservator/restorer.  Support groups or group counseling.  Text messaging programs.  Mobile phone apps or applications.  Taking medicines. Some of these medicines may have nicotine in them. If you are pregnant or breastfeeding, do not take any medicines to quit smoking unless your doctor says it is okay. Talk with your doctor about counseling or other things  that can help you. Talk with your doctor about using more than one strategy at the same time, such as taking medicines while you are also going to in-person counseling. This can help make quitting easier. What things can I do to make it easier to quit? Quitting smoking might feel very hard at first, but there is a lot that you can do to make it easier. Take these steps:  Talk to your family and friends. Ask them to support and encourage you.  Call phone quitlines, reach out to support groups, or work with a Social worker.  Ask people who smoke to not smoke around you.  Avoid places that make you want (trigger) to smoke, such as:  Bars.  Parties.  Smoke-break areas at work.  Spend time with people who do not smoke.  Lower the stress in your life. Stress can make you want to smoke. Try these things to help your stress:  Getting regular exercise.  Deep-breathing exercises.  Yoga.  Meditating.  Doing a body scan. To do this, close your eyes, focus on one area of your body at a time from head to toe, and notice which parts of your body are tense. Try to relax the muscles in those areas.  Download or buy apps on your mobile phone or tablet that can help you stick to your quit plan. There are many free apps, such as QuitGuide from the [employer] Office manager for Disease Control and Prevention). You can find more support from smokefree.gov and other websites. This information  is not intended to replace advice given to you by your health care provider. Make sure you discuss any questions you have with your health care provider. Document Released: 03/14/2009 Document Revised: 01/14/2016 Document Reviewed: 10/02/2014 Elsevier Interactive Patient Education  2017 [place].   -  Low-Sodium Eating Plan Sodium, which is an element that makes up salt, helps you maintain a healthy balance of fluids in your body. Too much sodium can increase your blood pressure and cause fluid and waste to be held in  your body. Your health care provider or dietitian may recommend following this plan if you have high blood pressure (hypertension), kidney disease, liver disease, or heart failure. Eating less sodium can help lower your blood pressure, reduce swelling, and protect your heart, liver, and kidneys. What are tips for following this plan? General guidelines   Most people on this plan should limit their sodium intake to 1,500-2,000 mg (milligrams) of sodium each day. Reading food labels   The Nutrition Facts label lists the amount of sodium in one serving of the food. If you eat more than one serving, you must multiply the listed amount of sodium by the number of servings.  Choose foods with less than 140 mg of sodium per serving.  Avoid foods with 300 mg of sodium or more per serving. Shopping   Look for lower-sodium products, often labeled as "low-sodium" or "no salt added."  Always check the sodium content even if foods are labeled as "unsalted" or "no salt added".  Buy fresh foods.  Avoid canned foods and premade or frozen meals.  Avoid canned, cured, or processed meats  Buy breads that have less than 80 mg of sodium per slice. Cooking   Eat more home-cooked food and less restaurant, buffet, and fast food.  Avoid adding salt when cooking. Use salt-free seasonings or herbs instead of table salt or sea salt. Check with your health care provider or pharmacist before using salt substitutes.  Cook with plant-based oils, such as canola, sunflower, or olive oil. Meal planning   When eating at a restaurant, ask that your food be prepared with less salt or no salt, if possible.  Avoid foods that contain MSG (monosodium glutamate). MSG is sometimes added to [place] food, bouillon, and some canned foods. What foods are recommended? The items listed may not be a complete list. Talk with your dietitian about what dietary choices are best for you. Grains  Low-sodium cereals, including oats,  puffed wheat and rice, and shredded wheat. Low-sodium crackers. Unsalted rice. Unsalted pasta. Low-sodium bread. Whole-grain breads and whole-grain pasta. Vegetables  Fresh or frozen vegetables. "No salt added" canned vegetables. "No salt added" tomato sauce and paste. Low-sodium or reduced-sodium tomato and vegetable juice. Fruits  Fresh, frozen, or canned fruit. Fruit juice. Meats and other protein foods  Fresh or frozen (no salt added) meat, poultry, seafood, and fish. Low-sodium canned tuna and salmon. Unsalted nuts. Dried peas, beans, and lentils without added salt. Unsalted canned beans. Eggs. Unsalted nut butters. Dairy  Milk. Soy milk. Cheese that is naturally low in sodium, such as ricotta cheese, fresh mozzarella, or Swiss cheese Low-sodium or reduced-sodium cheese. Cream cheese. Yogurt. Fats and oils  Unsalted butter. Unsalted margarine with no trans fat. Vegetable oils such as canola or olive oils. Seasonings and other foods  Fresh and dried herbs and spices. Salt-free seasonings. Low-sodium mustard and ketchup. Sodium-free salad dressing. Sodium-free light mayonnaise. Fresh or refrigerated horseradish. Lemon juice. Vinegar. Homemade, reduced-sodium, or low-sodium soups. Unsalted  popcorn and pretzels. Low-salt or salt-free chips. What foods are not recommended? The items listed may not be a complete list. Talk with your dietitian about what dietary choices are best for you. Grains  Instant hot cereals. Bread stuffing, pancake, and biscuit mixes. Croutons. Seasoned rice or pasta mixes. Noodle soup cups. Boxed or frozen macaroni and cheese. Regular salted crackers. Self-rising flour. Vegetables  Sauerkraut, pickled vegetables, and relishes. Olives. [place] fries. Onion rings. Regular canned vegetables (not low-sodium or reduced-sodium). Regular canned tomato sauce and paste (not low-sodium or reduced-sodium). Regular tomato and vegetable juice (not low-sodium or reduced-sodium). Frozen  vegetables in sauces. Meats and other protein foods  Meat or fish that is salted, canned, smoked, spiced, or pickled. Bacon, ham, sausage, hotdogs, corned beef, chipped beef, packaged lunch meats, salt pork, jerky, pickled herring, anchovies, regular canned tuna, sardines, salted nuts. Dairy  Processed cheese and cheese spreads. Cheese curds. Blue cheese. Feta cheese. String cheese. Regular cottage cheese. Buttermilk. Canned milk. Fats and oils  Salted butter. Regular margarine. Ghee. Bacon fat. Seasonings and other foods  Onion salt, garlic salt, seasoned salt, table salt, and sea salt. Canned and packaged gravies. Worcestershire sauce. Tartar sauce. Barbecue sauce. Teriyaki sauce. Soy sauce, including reduced-sodium. Steak sauce. Fish sauce. Oyster sauce. Cocktail sauce. Horseradish that you find on the shelf. Regular ketchup and mustard. Meat flavorings and tenderizers. Bouillon cubes. Hot sauce and Tabasco sauce. Premade or packaged marinades. Premade or packaged taco seasonings. Relishes. Regular salad dressings. Salsa. Potato and tortilla chips. Corn chips and puffs. Salted popcorn and pretzels. Canned or dried soups. Pizza. Frozen entrees and pot pies. Summary  Eating less sodium can help lower your blood pressure, reduce swelling, and protect your heart, liver, and kidneys.  Most people on this plan should limit their sodium intake to 1,500-2,000 mg (milligrams) of sodium each day.  Canned, boxed, and frozen foods are high in sodium. Restaurant foods, fast foods, and pizza are also very high in sodium. You also get sodium by adding salt to food.  Try to cook at home, eat more fresh fruits and vegetables, and eat less fast food, canned, processed, or prepared foods. This information is not intended to replace advice given to you by your health care provider. Make sure you discuss any questions you have with your health care provider. Document Released: 11/07/2001 Document Revised:  05/11/2016 Document Reviewed: 05/11/2016 Elsevier Interactive Patient Education  2017 [place] START PATIENT GUIDE TO LCHF/IF - for weightloss LOW CARB HIGH FAT / INTERMITTENT FASTING  Recommend: <50 gram carbohydrate a day for weightloss.  What is this diet and how does it work? o Insulin is a hormone made by your body that allows you to use sugar (glucose) from carbohydrates in the food you eat for energy or to store glucose (as fat) for future use  o Insulin levels need to be lowered in order to utilize our stored energy (fat) o Many struggling with obesity are insulin resistant and have high levels of insulin o This diet works to lower your insulin in two ways o Fasting - allows your insulin levels to naturally decrease  o Avoiding carbohydrates - carbs trigger increase in insulin Low Carb Healthy Fat (LCHF) o Get a free app for your phone, such as MyFitnessPal, to help you track your macronutrients (carbs/protein/fats) and to track your weight and body measurements to see your progress o Set your goal for around 10% carbs/20% protein/70% fat o A good starting goal for amount  of net carbs per day is 50 grams (some will aim for 20 grams) o "Net carbs" refers to total grams of carbs minus grams of fiber (as fiber is not typically absorbed). For example, if a food has 5g total carb and 3g fiber, that would be 2g net carbs o Increase healthy fats - eg. olive oil, eggs, nuts, avocado, cheese, butter, coconut, meats, fish o Avoid high carb foods - eg. bread, pasta, potatoes, rice, cookies, soda, juice, anything sugary o Buy full-fat ingredients (avoid low-fat versions, which often have more sugar) o No need to count calories, but pay close attention to grams of carbs on labels Intermittent Fasting (IF) o "Fasting" is going a period of time without eating - it helps to stay busy and well-hydrated o Purpose of fasting is to allow insulin levels to drop as low as possible,  allowing your body to switch into fat-burning mode o With this diet there are many approaches to fasting, but 16:8 and 24hr fasts are commonly used o 16:8 fast, usually 5-7 days a week - Fasting for 16 hours of the day, then eating all meals for the day over course of 8 hours. o 24 hour fast, usually 1-3 days a week - Typically eating one meal a day, then fasting until the next day. Plenty of fluids (and some salt to help you hold onto fluids) are recommended during longer fasts.  o During fasts certain beverages are still acceptable - water, sparkling water, bone broth, black tea or coffee, or tea/coffee with small amount of heavy whipping cream Special note for those on diabetic medications o Discuss your medications with your physician. You may need to hold your medication or adjust to only taking when eating. Diabetics should keep close track of their blood sugars when making any changes to diet/meds, to ensure they are staying within normal limits For more info about LCHF/IF o Watch "Therapeutic Fasting - Solving the Two-Compartment Problem" video by Dr. Sharman Cheek on YouTube (GreatestGyms.[email or web address]) for a great intro to these concepts o Read "The Obesity Code" and/or "The Complete Guide to Fasting" by Dr. Sharman Cheek o Go to www.dietdoctor.com for explanations, recipes, and infographics about foods to eat/avoid o Get a Free smartphone app that helps count carbohydrates  - ie MyKeto EXAMPLES TO GET STARTED Fasting Beverages -water (can add  tsp [place] salt once or twice a day to help stay hydrated for longer fasts) -Sparkling water (such as [employer] or similar; avoid any with artificial sweeteners)  -Bone broth (multiple recipes available online or can buy pre-made) -Tea or Coffee (Adding heavy whipping cream or coconut oil to your tea or coffee can be helpful if you find yourself getting too hungry during the fasts. Can also add cinnamon for flavor. Or "bulletproof  coffee.") Low Carb Healthy Fat Breakfast (if not fasting) -eggs in butter or olive oil with avocado -omelet with veggies and cheese  Lunch -hamburger with cheese and avocado wrapped in lettuce (no bun, no ketchup) -meat and cheese wrapped in lettuce (can dip in mustard or olive oil/vinegar/mayo) -salad with meat/cheese/nuts and higher fat dressing (vinaigrette or Ranch, etc) -tuna salad lettuce wrap -taco meat with cheese, sour cream, guacamole, cheese over lettuce  Dinner -steak with herb butter or [place] sauce -"[place]" pizza (uses cheese and almond flour for the dough - several recipes available online) -roasted or grilled chicken with skin on, with low carb sauce (buffalo, garlic butter, alfredo, pesto, etc) -baked salmon with lemon butter -chicken alfredo  with zucchini noodles -[place] butter chicken with low carb garlic naan -egg roll in a bowl  Side Dishes -mashed cauliflower (homemade or available in freezer section) -roast vegetables (green veggies that grow above ground rather than root veggies) with butter or cheese -Caprese salad (fresh mozzarella, tomato and basil with olive oil) -homemade low-carb coleslaw Snacks/Desserts (try to avoid unnecessary snacking and sweets in general) -celery or cucumber dipped in guacamole or sour cream dip -cheese and meat slices  -raspberries with whipped cream (can make homemade with no sugar added) -low carb [place] butter cake  AVOID - sugar, diet/regular soda, potatoes, breads, rice, pasta, candy, cookies, cakes, muffins, juice, high carb fruit (bananas, grapes), beer, ketchup, barbeque and other sweet saucesPneumococcal Polysaccharide Vaccine: What You Need to Know 1. Why get vaccinated? Vaccination can protect older adults (and some children and younger adults) from pneumococcal disease. Pneumococcal disease is caused by bacteria that can spread from person to person through close contact. It can cause ear infections, and it  can also lead to more serious infections of the:  Lungs (pneumonia),  Blood (bacteremia), and  Covering of the brain and spinal cord (meningitis). Meningitis can cause deafness and brain damage, and it can be fatal. Anyone can get pneumococcal disease, but children under 27 years of age, people with certain medical conditions, adults over 9 years of age, and cigarette smokers are at the highest risk. About 18,000 older adults die each year from pneumococcal disease in the [place]. Treatment of pneumococcal infections with penicillin and other drugs used to be more effective. But some strains of the disease have become resistant to these drugs. This makes prevention of the disease, through vaccination, even more important. 2. Pneumococcal polysaccharide vaccine (PPSV23) Pneumococcal polysaccharide vaccine (PPSV23) protects against 23 types of pneumococcal bacteria. It will not prevent all pneumococcal disease. PPSV23 is recommended for:  All adults 45 years of age and older,  Anyone 2 through 36 years of age with certain long-term health problems,  Anyone 2 through 36 years of age with a weakened immune system,  Adults 48 through 36 years of age who smoke cigarettes or have asthma. Most people need only one dose of PPSV. A second dose is recommended for certain high-risk groups. People 16 and older should get a dose even if they have gotten one or more doses of the vaccine before they turned 65. Your healthcare provider can give you more information about these recommendations. Most healthy adults develop protection within 2 to 3 weeks of getting the shot. 3. Some people should not get this vaccine  Anyone who has had a life-threatening allergic reaction to PPSV should not get another dose.  Anyone who has a severe allergy to any component of PPSV should not receive it. Tell your provider if you have any severe allergies.  Anyone who is moderately or severely ill when the shot is  scheduled may be asked to wait until they recover before getting the vaccine. Someone with a mild illness can usually be vaccinated.  Children less than 48 years of age should not receive this vaccine.  There is no evidence that PPSV is harmful to either a pregnant woman or to her fetus. However, as a precaution, women who need the vaccine should be vaccinated before becoming pregnant, if possible. 4. Risks of a vaccine reaction With any medicine, including vaccines, there is a chance of side effects. These are usually mild and go away on their own, but serious reactions are also possible.  About half of people who get PPSV have mild side effects, such as redness or pain where the shot is given, which go away within about two days. Less than 1 out of 100 people develop a fever, muscle aches, or more severe local reactions. Problems that could happen after any vaccine:   People sometimes faint after a medical procedure, including vaccination. Sitting or lying down for about 15 minutes can help prevent fainting, and injuries caused by a fall. Tell your doctor if you feel dizzy, or have vision changes or ringing in the ears.  Some people get severe pain in the shoulder and have difficulty moving the arm where a shot was given. This happens very rarely.  Any medication can cause a severe allergic reaction. Such reactions from a vaccine are very rare, estimated at about 1 in a million doses, and would happen within a few minutes to a few hours after the vaccination. As with any medicine, there is a very remote chance of a vaccine causing a serious injury or death. The safety of vaccines is always being monitored. For more information, visit: http://www.[place].org/ 5. What if there is a serious reaction? What should I look for?  Look for anything that concerns you, such as signs of a severe allergic reaction, very high fever, or unusual behavior. Signs of a severe allergic reaction can include  hives, swelling of the face and throat, difficulty breathing, a fast heartbeat, dizziness, and weakness. These would usually start a few minutes to a few hours after the vaccination. What should I do?  If you think it is a severe allergic reaction or other emergency that can't wait, call 9-1-1 or get to the nearest hospital. Otherwise, call your doctor. Afterward, the reaction should be reported to the Vaccine Adverse Event Reporting System (VAERS). Your doctor might file this report, or you can do it yourself through the VAERS web site at www.vaers.SamedayNews.es, or by calling 902-108-3319. VAERS does not give medical advice.  6. How can I learn more?  Ask your doctor. He or she can give you the vaccine package insert or suggest other sources of information.  Call your local or state health department.  Contact the Centers for Disease Control and Prevention (CDC):  Call 580-623-0431 (1-800-CDC-INFO) or  Visit CDC's website at http://hunter.com/ CDC Pneumococcal Polysaccharide Vaccine VIS (09/22/13) This information is not intended to replace advice given to you by your health care provider. Make sure you discuss any questions you have with your health care provider. Document Released: 03/15/2006 Document Revised: 02/06/2016 Document Reviewed: 02/06/2016 Elsevier Interactive Patient Education  2017 [place] (Tetanus and Diphtheria): What You Need to Know 1. Why get vaccinated? Tetanus  and diphtheria are very serious diseases. They are rare in the [place] today, but people who do become infected often have severe complications. Td vaccine is used to protect adolescents and adults from both of these diseases. Both tetanus and diphtheria are infections caused by bacteria. Diphtheria spreads from person to person through coughing or sneezing. Tetanus-causing bacteria enter the body through cuts, scratches, or wounds. TETANUS (lockjaw) causes painful muscle tightening and  stiffness, usually all over the body.  It can lead to tightening of muscles in the head and neck so you can't open your mouth, swallow, or sometimes even breathe. Tetanus kills about 1 out of every 10 people who are infected even after receiving the best medical care. DIPHTHERIA can cause a thick coating to form in the back of the  throat.  It can lead to breathing problems, paralysis, heart failure, and death. Before vaccines, as many as 200,000 cases of diphtheria and hundreds of cases of tetanus were reported in the [place] each year. Since vaccination began, reports of cases for both diseases have dropped by about 99%. 2. Td vaccine Td vaccine can protect adolescents and adults from tetanus and diphtheria. Td is usually given as a booster dose every 10 years but it can also be given earlier after a severe and dirty wound or burn. Another vaccine, called Tdap, which protects against pertussis in addition to tetanus and diphtheria, is sometimes recommended instead of Td vaccine. Your doctor or the person giving you the vaccine can give you more information. Td may safely be given at the same time as other vaccines. 3. Some people should not get this vaccine  A person who has ever had a life-threatening allergic reaction after a previous dose of any tetanus or diphtheria containing vaccine, OR has a severe allergy to any part of this vaccine, should not get Td vaccine. Tell the person giving the vaccine about any severe allergies.  Talk to your doctor if you:  had severe pain or swelling after any vaccine containing diphtheria or tetanus,  ever had a condition called Guillain Barre Syndrome (GBS),  aren't feeling well on the day the shot is scheduled. 4. What are the risks from Td vaccine? With any medicine, including vaccines, there is a chance of side effects. These are usually mild and go away on their own. Serious reactions are also possible but are rare. Most people who get Td  vaccine do not have any problems with it. Mild problems following Td vaccine:  (Did not interfere with activities)  Pain where the shot was given (about 8 people in 10)  Redness or swelling where the shot was given (about 1 person in 4)  Mild fever (rare)  Headache (about 1 person in 4)  Tiredness (about 1 person in 4) Moderate problems following Td vaccine:  (Interfered with activities, but did not require medical attention)  Fever over 102F (rare) Severe problems following Td vaccine:  (Unable to perform usual activities; required medical attention)  Swelling, severe pain, bleeding and/or redness in the arm where the shot was given (rare). Problems that could happen after any vaccine:   People sometimes faint after a medical procedure, including vaccination. Sitting or lying down for about 15 minutes can help prevent fainting, and injuries caused by a fall. Tell your doctor if you feel dizzy, or have vision changes or ringing in the ears.  Some people get severe pain in the shoulder and have difficulty moving the arm where a shot was given. This happens very rarely.  Any medication can cause a severe allergic reaction. Such reactions from a vaccine are very rare, estimated at fewer than 1 in a million doses, and would happen within a few minutes to a few hours after the vaccination. As with any medicine, there is a very remote chance of a vaccine causing a serious injury or death. The safety of vaccines is always being monitored. For more information, visit: http://www.aguilar.org/ 5. What if there is a serious reaction? What should I look for?  Look for anything that concerns you, such as signs of a severe allergic reaction, very high fever, or unusual behavior. Signs of a severe allergic reaction can include hives, swelling of the face and throat, difficulty breathing, a fast heartbeat, dizziness, and weakness. These would usually start  a few minutes to a few hours after the  vaccination. What should I do?   If you think it is a severe allergic reaction or other emergency that can't wait, call 9-1-1 or get the person to the nearest hospital. Otherwise, call your doctor.  Afterward, the reaction should be reported to the Vaccine Adverse Event Reporting System (VAERS). Your doctor might file this report, or you can do it yourself through the VAERS web site at www.vaers.SamedayNews.es, or by calling (954)532-0376.  VAERS does not give medical advice. 6. The National Vaccine Injury Compensation Program The [employer] Vaccine Injury Compensation Program (VICP) is a federal program that was created to compensate people who may have been injured by certain vaccines. Persons who believe they may have been injured by a vaccine can learn about the program and about filing a claim by calling 816-142-1540 or visiting the [place] website at GoldCloset.com.ee. There is a time limit to file a claim for compensation. 7. How can I learn more?  Ask your doctor. He or she can give you the vaccine package insert or suggest other sources of information.  Call your local or state health department.  Contact the Centers for Disease Control and Prevention (CDC):  Call 612-474-7998 (1-800-CDC-INFO)  Visit CDC's website at http://hunter.com/ CDC Td Vaccine VIS (09/10/15) This information is not intended to replace advice given to you by your health care provider. Make sure you discuss any questions you have with your health care provider. Document Released: 03/15/2006 Document Revised: 02/06/2016 Document Reviewed: 02/06/2016 Elsevier Interactive Patient Education  2017 [employer].

## 2016-09-09 NOTE — Progress Notes (Signed)
Samantha [name], is a 36 y.o. female  RWE:[id number]  YPP:[id number]  DOB - [birth year]  Chief Complaint  Patient presents with  . Annual Exam        Subjective:   Samantha [name] is a 36 y.o. female here today for a follow up visit, last seen 07/06/16.  She was recently seen in ED 09/02/16 for bronchitis as well and given TEssalon pearls. She states her cough/congestion is much improved, denies wheezing/f/c/sob/night sweats.  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  She is still smoking, almost 1/2 ppd, wants to try to stop and working through it.  She is still taking metformin 2 tab po bid  - she states when she tried to wean down to bid as recd, she started getting having stomach pains/diarrhea/constipation, etc. Denies hypoglycemic sx.   Patient has No headache, No chest pain, No abdominal pain - No Nausea, No new weakness tingling or numbness, No Cough - SOB.  No problems updated.  ALLERGIES: No Known Allergies  PAST MEDICAL HISTORY: Past Medical History:  Diagnosis Date  . Hypertension     MEDICATIONS AT HOME: Prior to Admission medications   Medication Sig Start Date End Date Taking? Authorizing Provider  albuterol (PROVENTIL HFA;VENTOLIN HFA) 108 (90 Base) MCG/ACT inhaler Inhale 2 puffs into the lungs every 6 (six) hours as needed for wheezing or shortness of breath. 09/09/16   Maren Reamer, MD  amLODipine (NORVASC) 10 MG tablet Take 1 tablet (10 mg total) by mouth daily. Must have office visit for refills 09/09/16   Maren Reamer, MD  benzonatate (TESSALON) 100 MG capsule Take 1 capsule (100 mg total) by mouth every 8 (eight) hours. Patient not taking: Reported on 09/09/2016 09/02/16   Fatima Blank, MD  Blood Glucose Monitoring Suppl (ACCU-CHEK AVIVA PLUS) w/Device KIT 1 application by Does not apply route QID. 11/12/15   Maren Reamer, MD  etonogestrel-ethinyl estradiol (NUVARING)  0.12-0.015 MG/24HR vaginal ring INSERT 1 RING VAGINALLY EVERY 28 DAYS, LEAVE IN PLACE FOR 3 CONSECUTIVE WEEKS, THEN REMOVE FOR 1 WEEK 09/09/16   Maren Reamer, MD  glucose blood (ACCU-CHEK COMPACT STRIPS) test strip Use as instructed 11/12/15   Maren Reamer, MD  ibuprofen (ADVIL,MOTRIN) 800 MG tablet Take 1 tablet (800 mg total) by mouth every 8 (eight) hours as needed for mild pain. 01/23/15   Kristen N Ward, DO  Lancets (ACCU-CHEK SOFT TOUCH) lancets Use as instructed 11/12/15   Maren Reamer, MD  lisinopril-hydrochlorothiazide (PRINZIDE,ZESTORETIC) 10-12.5 MG tablet Take 1 tablet by mouth daily. 09/09/16   Maren Reamer, MD  metFORMIN (GLUCOPHAGE-XR) 500 MG 24 hr tablet Take 1 tablet (500 mg total) by mouth 2 (two) times daily with a meal. 09/09/16   Maren Reamer, MD     Objective:   Vitals:   09/09/16 0914  BP: 137/89  Pulse: 94  Resp: 16  Temp: 98.3 F (36.8 C)  TempSrc: Oral  SpO2: 99%  Weight: 211 lb 12.8 oz (96.1 kg)    Exam General appearance : Awake, alert, not in any distress. Speech Clear. Not toxic looking, morbid obese HEENT: Atraumatic and Normocephalic, pupils equally reactive to light. Neck: supple, no JVD.  Chest:Good air entry bilaterally, no added sounds. CVS: S1 S2 regular, no murmurs/gallups or rubs. Abdomen: Bowel sounds active, Non tende, obese,  and not distended with no gaurding, rigidity or rebound. Extremities: B/L Lower Ext shows no edema, both legs  are warm to touch Neurology: Awake alert, and oriented X 3, CN II-XII grossly intact, Non focal Skin:No Rash  Data Review Lab Results  Component Value Date   HGBA1C 6.1 09/09/2016   HGBA1C 8.1 11/12/2015    Depression screen [facility] 2/9 09/09/2016 07/06/2016 11/12/2015  Decreased Interest 0 0 0  Down, Depressed, Hopeless 0 0 0  PHQ - 2 Score 0 0 0  Altered sleeping - - 0  Tired, decreased energy - - 0  Change in appetite - - 0  Feeling bad or failure about yourself  - - 0  Trouble  concentrating - - 0  Moving slowly or fidgety/restless - - 0  Suicidal thoughts - - 0  PHQ-9 Score - - 0  Difficult doing work/chores - - Not difficult at all      Assessment & Plan   1. Type 2 diabetes mellitus with hyperglycemia, without long-term current use of insulin (HCC) Advised to try to wean down metformin to 500bid as able - may need more stool softeners, etc. For constipation.; advised to watch for low blood sugars since she is now in Prediabetes range. - POCT glucose (manual entry) - POCT glycosylated hemoglobin (Hb A1C) 6.1  2. htn - slightly above goal of 130/80, low salt diet encouraged. - chg lisinopril 10 to prinzide 10-12.5 qd - continue norvasc 10 qd  3. Smoking trying to quit 1/2 ppd, total cessation recd. For your tobacco abuse: Strongly recommend that he stop smoking back and all other inhaled products right away Call 1-800-Quit-Now to get free nicotine replacement from the state of [place]  4. Healthcare maintenance - TSH - VITAMIN D 25 Hydroxy (Vit-D Deficiency, Fractures) - Lipid Panel - received pneumovac 23 and tdap today.  5. Encounter for screening for HIV - HIV antibody (with reflex)  6. Recent bronchitis, no wheezing today. Still smoking - prn albuterol mdi rx.  7. Obesity Weight loss would help considerably.  8. Filled out school appl  ppwl Vision 20/20 bilat. Low carb diet info provided.   Patient have been counseled extensively about nutrition and exercise  Return in about 3 months (around 12/09/2016), or if symptoms worsen or fail to improve.  The patient was given clear instructions to go to ER or return to medical center if symptoms don't improve, worsen or new problems develop. The patient verbalized understanding. The patient was told to call to get lab results if they haven't heard anything in the next week.   This note has been created with Surveyor, quantity. Any transcriptional errors  are unintentional.   Maren Reamer, MD, [facility] [place], [place]   09/09/2016, 10:31 AM

## 2016-09-10 LAB — LIPID PANEL
CHOL/HDL RATIO: 3.9 ratio (ref 0.0–4.4)
Cholesterol, Total: 219 mg/dL — ABNORMAL HIGH (ref 100–199)
HDL: 56 mg/dL (ref >39)
LDL Calculated: 88 mg/dL (ref 0–99)
TRIGLYCERIDES: 377 mg/dL — AB (ref 0–149)
VLDL Cholesterol Cal: 75 mg/dL — ABNORMAL HIGH (ref 5–40)

## 2016-09-10 LAB — TSH: TSH: 1.82 u[IU]/mL (ref 0.450–4.500)

## 2016-09-10 LAB — HIV ANTIBODY (ROUTINE TESTING W REFLEX): HIV SCREEN 4TH GENERATION: NONREACTIVE

## 2016-09-10 LAB — VITAMIN D 25 HYDROXY (VIT D DEFICIENCY, FRACTURES): Vit D, 25-Hydroxy: 11.8 ng/mL — ABNORMAL LOW (ref 30.0–100.0)

## 2016-09-11 ENCOUNTER — Other Ambulatory Visit: Payer: Self-pay | Admitting: Internal Medicine

## 2016-09-11 MED ORDER — PRAVASTATIN SODIUM 20 MG PO TABS: 20.0000 mg | ORAL_TABLET | Freq: Every day | ORAL | 3 refills | Status: DC

## 2016-09-11 MED ORDER — VITAMIN D (ERGOCALCIFEROL) 1.25 MG (50000 UNIT) PO CAPS: 50000.0000 [IU] | ORAL_CAPSULE | ORAL | 0 refills | Status: DC

## 2016-09-14 ENCOUNTER — Telehealth: Payer: Self-pay | Admitting: Internal Medicine

## 2016-09-14 NOTE — Telephone Encounter (Signed)
Called to inform patient that provider is not in the office on 7/12 (date we scheduled her app) but I was informed that I had the wrong number [phone]

## 2016-09-18 ENCOUNTER — Encounter (HOSPITAL_COMMUNITY): Payer: Self-pay | Admitting: *Deleted

## 2016-09-18 ENCOUNTER — Emergency Department (HOSPITAL_COMMUNITY): Payer: Self-pay

## 2016-09-18 ENCOUNTER — Telehealth: Payer: Self-pay

## 2016-09-18 ENCOUNTER — Emergency Department (HOSPITAL_COMMUNITY)
Admission: EM | Admit: 2016-09-18 | Discharge: 2016-09-18 | Disposition: A | Discharge status: Home / Self Care | Payer: Self-pay | Attending: Emergency Medicine | Admitting: Emergency Medicine

## 2016-09-18 DIAGNOSIS — I10 Essential (primary) hypertension: Secondary | ICD-10-CM | POA: Insufficient documentation

## 2016-09-18 DIAGNOSIS — F17210 Nicotine dependence, cigarettes, uncomplicated: Secondary | ICD-10-CM | POA: Insufficient documentation

## 2016-09-18 DIAGNOSIS — E119 Type 2 diabetes mellitus without complications: Secondary | ICD-10-CM | POA: Insufficient documentation

## 2016-09-18 DIAGNOSIS — R079 Chest pain, unspecified: Secondary | ICD-10-CM

## 2016-09-18 DIAGNOSIS — Z7984 Long term (current) use of oral hypoglycemic drugs: Secondary | ICD-10-CM | POA: Insufficient documentation

## 2016-09-18 DIAGNOSIS — Z79899 Other long term (current) drug therapy: Secondary | ICD-10-CM | POA: Insufficient documentation

## 2016-09-18 HISTORY — DX: Type 2 diabetes mellitus without complications: E11.9

## 2016-09-18 LAB — CBC
HEMATOCRIT: 39.5 % (ref 36.0–46.0)
Hemoglobin: 13.3 g/dL (ref 12.0–15.0)
MCH: 27.9 pg (ref 26.0–34.0)
MCHC: 33.7 g/dL (ref 30.0–36.0)
MCV: 82.8 fL (ref 78.0–100.0)
PLATELETS: 383 10*3/uL (ref 150–400)
RBC: 4.77 MIL/uL (ref 3.87–5.11)
RDW: 13.5 % (ref 11.5–15.5)
WBC: 14.5 10*3/uL — ABNORMAL HIGH (ref 4.0–10.5)

## 2016-09-18 LAB — I-STAT TROPONIN, ED
TROPONIN I, POC: 0 ng/mL (ref 0.00–0.08)
Troponin i, poc: 0 ng/mL (ref 0.00–0.08)

## 2016-09-18 LAB — BASIC METABOLIC PANEL
Anion gap: 12 (ref 5–15)
BUN: 9 mg/dL (ref 6–20)
CHLORIDE: 103 mmol/L (ref 101–111)
CO2: 23 mmol/L (ref 22–32)
Calcium: 9.2 mg/dL (ref 8.9–10.3)
Creatinine, Ser: 0.74 mg/dL (ref 0.44–1.00)
GFR calc non Af Amer: >60 mL/min (ref >60)
Glucose, Bld: 188 mg/dL — ABNORMAL HIGH (ref 65–99)
POTASSIUM: 3.3 mmol/L — AB (ref 3.5–5.1)
Sodium: 138 mmol/L (ref 135–145)

## 2016-09-18 LAB — D-DIMER, QUANTITATIVE: D-Dimer, Quant: 0.56 ug/mL-FEU — ABNORMAL HIGH (ref 0.00–0.50)

## 2016-09-18 MED ORDER — IOPAMIDOL (ISOVUE-370) INJECTION 76%
INTRAVENOUS | Status: AC
  Administered 2016-09-18: 100 mL
  Filled 2016-09-18: qty 100

## 2016-09-18 NOTE — Telephone Encounter (Signed)
Contacted pt to go over lab results pt is aware of results and doesn't have any questions or concerns 

## 2016-09-18 NOTE — ED Notes (Signed)
Pt verbalized understanding discharge instructions and denies any further needs or questions at this time. VS stable, ambulatory and steady gait.   

## 2016-09-18 NOTE — ED Notes (Signed)
Message sent via iconnect regarding wait and pt status

## 2016-09-18 NOTE — ED Triage Notes (Signed)
Pt reports intermittent chest pain that started yesterday. Pt states that pain is worse with movement and breathing.

## 2016-09-18 NOTE — ED Provider Notes (Signed)
[facility] DEPT Provider Note   CSN: [id number] Arrival date & time: 09/18/16  1254     History   Chief Complaint Chief Complaint  Patient presents with  . Chest Pain    HPI Samantha [name] is a 36 y.o. female.  The history is provided by the patient and medical records.  Chest Pain   This is a new problem. The current episode started 3 to 5 hours ago. The problem occurs constantly. The problem has been gradually improving. The pain is associated with rest. The pain is present in the substernal region. The pain is moderate. The quality of the pain is described as pressure-like. The pain does not radiate. The symptoms are aggravated by deep breathing. Associated symptoms include cough and shortness of breath. Pertinent negatives include no abdominal pain, no back pain, no diaphoresis, no dizziness, no fever, no headaches, no irregular heartbeat, no lower extremity edema, no nausea, no palpitations, no sputum production, no vomiting and no weakness. She has tried nothing for the symptoms. Risk factors include oral contraceptive use, stress and obesity.  Pertinent negatives for past medical history include no seizures.  Her family medical history is significant for heart disease and stroke.    Past Medical History:  Diagnosis Date  . Hypertension     Patient Active Problem List   Diagnosis Date Noted  . Morbid obesity ([place]) 07/06/2016  . Type 2 diabetes mellitus with hyperglycemia ([place]) 11/12/2015  . Essential hypertension 11/04/2014    History reviewed. No pertinent surgical history.  OB History    Gravida Para Term Preterm AB Living   _0 SAB TAB Ectopic Multiple Live Births   1               Home Medications    Prior to Admission medications   Medication Sig Start Date End Date Taking? Authorizing Provider  albuterol (PROVENTIL HFA;VENTOLIN HFA) 108 (90 Base) MCG/ACT inhaler Inhale 2 puffs into the lungs every 6 (six) hours as needed for wheezing or  shortness of breath. 09/09/16   Maren Reamer, MD  amLODipine (NORVASC) 10 MG tablet Take 1 tablet (10 mg total) by mouth daily. Must have office visit for refills 09/09/16   Maren Reamer, MD  benzonatate (TESSALON) 100 MG capsule Take 1 capsule (100 mg total) by mouth every 8 (eight) hours. Patient not taking: Reported on 09/09/2016 09/02/16   Fatima Blank, MD  Blood Glucose Monitoring Suppl (ACCU-CHEK AVIVA PLUS) w/Device KIT 1 application by Does not apply route QID. 11/12/15   Maren Reamer, MD  etonogestrel-ethinyl estradiol (NUVARING) 0.12-0.015 MG/24HR vaginal ring INSERT 1 RING VAGINALLY EVERY 28 DAYS, LEAVE IN PLACE FOR 3 CONSECUTIVE WEEKS, THEN REMOVE FOR 1 WEEK 09/09/16   Maren Reamer, MD  glucose blood (ACCU-CHEK COMPACT STRIPS) test strip Use as instructed 11/12/15   Maren Reamer, MD  ibuprofen (ADVIL,MOTRIN) 800 MG tablet Take 1 tablet (800 mg total) by mouth every 8 (eight) hours as needed for mild pain. 01/23/15   Kristen N Ward, DO  Lancets (ACCU-CHEK SOFT TOUCH) lancets Use as instructed 11/12/15   Maren Reamer, MD  lisinopril-hydrochlorothiazide (PRINZIDE,ZESTORETIC) 10-12.5 MG tablet Take 1 tablet by mouth daily. 09/09/16   Maren Reamer, MD  metFORMIN (GLUCOPHAGE-XR) 500 MG 24 hr tablet Take 1 tablet (500 mg total) by mouth 2 (two) times daily with a meal. 09/09/16   Maren Reamer, MD  pravastatin (PRAVACHOL) 20 MG  tablet Take 1 tablet (20 mg total) by mouth daily. 09/11/16   Maren Reamer, MD  Vitamin D, Ergocalciferol, (DRISDOL) 50000 units CAPS capsule Take 1 capsule (50,000 Units total) by mouth every 7 (seven) days. 09/11/16   Maren Reamer, MD    Family History Family History  Problem Relation Age of Onset  . Cancer Father     Social History Social History  Substance Use Topics  . Smoking status: Current Every Day Smoker    Packs/day: 0.50    Years: 0.40    Types: Cigarettes  . Smokeless tobacco: Never Used  . Alcohol use No      Allergies   Patient has no known allergies.   Review of Systems Review of Systems  Constitutional: Negative for chills, diaphoresis and fever.  HENT: Negative for ear pain and sore throat.   Eyes: Negative for pain and visual disturbance.  Respiratory: Positive for cough and shortness of breath. Negative for sputum production.   Cardiovascular: Positive for chest pain. Negative for palpitations.  Gastrointestinal: Negative for abdominal pain, nausea and vomiting.  Genitourinary: Negative for dysuria and hematuria.  Musculoskeletal: Negative for arthralgias and back pain.  Skin: Negative for color change and rash.  Neurological: Negative for dizziness, seizures, syncope, weakness and headaches.  All other systems reviewed and are negative.    Physical Exam Updated Vital Signs BP (!) 147/86 (BP Location: Left Arm)   Pulse 87   Temp 98.7 F (37.1 C) (Oral)   Resp 18   LMP 08/18/2016 (Approximate)   SpO2 100%   Physical Exam  Constitutional: She is oriented to person, place, and time. She appears well-developed and well-nourished. No distress.  HENT:  Head: Normocephalic and atraumatic.  Eyes: Conjunctivae are normal.  Neck: Neck supple.  Cardiovascular: Normal rate and regular rhythm.   No murmur heard. Pulmonary/Chest: Effort normal and breath sounds normal. No respiratory distress.  Abdominal: Soft. There is no tenderness.  Musculoskeletal: She exhibits no edema.  Neurological: She is alert and oriented to person, place, and time.  Skin: Skin is warm and dry.  Psychiatric: She has a normal mood and affect.  Nursing note and vitals reviewed.    ED Treatments / Results  Labs (all labs ordered are listed, but only abnormal results are displayed) Labs Reviewed  BASIC METABOLIC PANEL - Abnormal; Notable for the following:       Result Value   Potassium 3.3 (*)    Glucose, Bld 188 (*)    All other components within normal limits  CBC - Abnormal; Notable for  the following:    WBC 14.5 (*)    All other components within normal limits  I-STAT TROPOININ, ED    EKG  EKG Interpretation  Date/Time:  Friday September 18 2016 13:02:51 EDT Ventricular Rate:  104 PR Interval:  140 QRS Duration: 80 QT Interval:  334 QTC Calculation: 439 R Axis:   23 Text Interpretation:  Sinus tachycardia Cannot rule out Anterior infarct , age undetermined Abnormal ECG Confirmed by Reather Converse MD, JOSHUA [phone]) on 09/18/2016 7:29:52 PM       Radiology Dg Chest 2 View  Result Date: 09/18/2016 CLINICAL DATA:  Chest pain EXAM: CHEST  2 VIEW COMPARISON:  09/02/2016 chest radiograph. FINDINGS: Stable cardiomediastinal silhouette with normal heart size. No pneumothorax. No pleural effusion. Lungs appear clear, with no acute consolidative airspace disease and no pulmonary edema. IMPRESSION: No active cardiopulmonary disease. Electronically Signed   By: Ilona Sorrel M.D.   On:  09/18/2016 14:05    Procedures Procedures (including critical care time)  Medications Ordered in ED Medications - No data to display   Initial Impression / Assessment and Plan / ED Course  I have reviewed the triage vital signs and the nursing notes.  Pertinent labs & imaging results that were available during my care of the patient were reviewed by me and considered in my medical decision making (see chart for details).    Pt with h/o HTN, HLD, anxiety presents with CP. Acute onset today while driving, described as pressure, severe intensity, was constant but is now intermittent, made worse by deep breathing, and associated w/SOB & cough. Denies F/C, HA, lightheadedness, N/V/D, abdominal pain, urinary symptoms, recent illness, or sick contacts. Family h/o of MI<55yo. Uses Nuvaring for contraception.  VS & exam as above. EKG: ST @ 104bpm w/o signs of ischemia. CXR WNL. Labs drawn in triage remarkable for WBC 14.5 & undetectable troponin.  Cannot PERC based on hormone use; repeat troponin & d-dimer  ordered.  2nd troponin again undetectable. D-dimer 0.56; CTA PE scan ordered and shows No evidence of PE, acute aortic syndrome, or other acute thoracic abnormality. Cause of the patient's symptoms unclear, but doubt emergent etiology given history, exam, workup.  Explained all results to the Pt & mother. Will discharge the Pt home. Recommending follow-up with PCP. ED return precautions provided. Pt acknowledged understanding of, and concurrence with the plan. All questions answered to her satisfaction. In stable condition at the time of discharge.  Final Clinical Impressions(s) / ED Diagnoses   Final diagnoses:  Chest pain, unspecified type    New Prescriptions New Prescriptions   No medications on file     Jenny Reichmann, MD 09/18/16 [place], MD 09/19/16 [phone]

## 2016-09-22 ENCOUNTER — Ambulatory Visit: Payer: Self-pay | Attending: Internal Medicine | Admitting: Internal Medicine

## 2016-09-22 ENCOUNTER — Encounter: Payer: Self-pay | Admitting: Internal Medicine

## 2016-09-22 ENCOUNTER — Encounter: Payer: Self-pay | Admitting: *Deleted

## 2016-09-22 VITALS — BP 118/81 | HR 80 | Temp 98.7°F | Resp 18 | Ht 62.0 in | Wt 209.0 lb

## 2016-09-22 DIAGNOSIS — K2100 Gastro-esophageal reflux disease with esophagitis, without bleeding: Secondary | ICD-10-CM

## 2016-09-22 DIAGNOSIS — K5900 Constipation, unspecified: Secondary | ICD-10-CM | POA: Insufficient documentation

## 2016-09-22 DIAGNOSIS — M7989 Other specified soft tissue disorders: Secondary | ICD-10-CM | POA: Insufficient documentation

## 2016-09-22 DIAGNOSIS — E1165 Type 2 diabetes mellitus with hyperglycemia: Secondary | ICD-10-CM | POA: Insufficient documentation

## 2016-09-22 DIAGNOSIS — Z79899 Other long term (current) drug therapy: Secondary | ICD-10-CM | POA: Insufficient documentation

## 2016-09-22 DIAGNOSIS — I10 Essential (primary) hypertension: Secondary | ICD-10-CM | POA: Insufficient documentation

## 2016-09-22 DIAGNOSIS — F17210 Nicotine dependence, cigarettes, uncomplicated: Secondary | ICD-10-CM | POA: Insufficient documentation

## 2016-09-22 DIAGNOSIS — E785 Hyperlipidemia, unspecified: Secondary | ICD-10-CM | POA: Insufficient documentation

## 2016-09-22 DIAGNOSIS — Z7984 Long term (current) use of oral hypoglycemic drugs: Secondary | ICD-10-CM | POA: Insufficient documentation

## 2016-09-22 DIAGNOSIS — R079 Chest pain, unspecified: Secondary | ICD-10-CM | POA: Insufficient documentation

## 2016-09-22 LAB — POCT GLYCOSYLATED HEMOGLOBIN (HGB A1C): HEMOGLOBIN A1C: 6.1

## 2016-09-22 LAB — GLUCOSE, POCT (MANUAL RESULT ENTRY): POC Glucose: 129 mg/dl — AB (ref 70–99)

## 2016-09-22 MED ORDER — PANTOPRAZOLE SODIUM 40 MG PO TBEC: 40.0000 mg | DELAYED_RELEASE_TABLET | Freq: Every day | ORAL | 3 refills | Status: DC

## 2016-09-22 NOTE — Patient Instructions (Addendum)
Ground flax seeds - good source of fiber  - For your tobacco abuse: Strongly recommend that he stop smoking back and all other inhaled products right away Call 1-800-Quit-Now to get free nicotine replacement from the state of [place]  - To address high cholesterol: this please limit saturated fat to no more than 7% of your calories, limit cholesterol to 200 mg/day, increase fiber and exercise as tolerated. If needed we may add another cholesterol lowering medication to your regimen.   -   Cholesterol Cholesterol is a fat. Your body needs a small amount of cholesterol. Cholesterol (plaque) may build up in your blood vessels (arteries). That makes you more likely to have a heart attack or stroke. You cannot feel your cholesterol level. Having a blood test is the only way to find out if your level is high. Keep your test results. Work with your doctor to keep your cholesterol at a good level. What do the results mean?  Total cholesterol is how much cholesterol is in your blood.  LDL is bad cholesterol. This is the type that can build up. Try to have low LDL.  HDL is good cholesterol. It cleans your blood vessels and carries LDL away. Try to have high HDL.  Triglycerides are fat that the body can store or burn for energy. What are good levels of cholesterol?  Total cholesterol below 200.  LDL below 100 is good for people who have health risks. LDL below 70 is good for people who have very high risks.  HDL above 40 is good. It is best to have HDL of 60 or higher.  Triglycerides below 150. How can I lower my cholesterol? Diet  Follow your diet program as told by your doctor.  Choose fish, white meat chicken, or [place] that is roasted or baked. Try not to eat red meat, fried foods, sausage, or lunch meats.  Eat lots of fresh fruits and vegetables.  Choose whole grains, beans, pasta, potatoes, and cereals.  Choose olive oil, corn oil, or canola oil. Only use small amounts.  Try not  to eat butter, mayonnaise, shortening, or palm kernel oils.  Try not to eat foods with trans fats.  Choose low-fat or nonfat dairy foods.  Drink skim or nonfat milk.  Eat low-fat or nonfat yogurt and cheeses.  Try not to drink whole milk or cream.  Try not to eat ice cream, egg yolks, or full-fat cheeses.  Healthy desserts include angel food cake, ginger snaps, animal crackers, hard candy, popsicles, and low-fat or nonfat frozen yogurt. Try not to eat pastries, cakes, pies, and cookies. Exercise  Follow your exercise program as told by your doctor.  Be more active. Try gardening, walking, and taking the stairs.  Ask your doctor about ways that you can be more active. Medicine  Take over-the-counter and prescription medicines only as told by your doctor. This information is not intended to replace advice given to you by your health care provider. Make sure you discuss any questions you have with your health care provider. Document Released: 08/14/2008 Document Revised: 12/18/2015 Document Reviewed: 11/28/2015 Elsevier Interactive Patient Education  2017 [facility]-healthy meal planning includes:  Limiting unhealthy fats.  Increasing healthy fats.  Making other small dietary changes. You may need to talk with your doctor or a diet specialist (dietitian) to create an eating plan that is right for you. What types of fat should I choose?  Choose healthy fats. These include  olive oil and canola oil, flaxseeds, walnuts, almonds, and seeds.  Eat more omega-3 fats. These include salmon, mackerel, sardines, tuna, flaxseed oil, and ground flaxseeds. Try to eat fish at least twice each week.  Limit saturated fats.  Saturated fats are often found in animal products, such as meats, butter, and cream.  Plant sources of saturated fats include palm oil, palm kernel oil, and coconut oil.  Avoid foods with partially hydrogenated oils in them.  These include stick margarine, some tub margarines, cookies, crackers, and other baked goods. These contain trans fats. What general guidelines do I need to follow?  Check food labels carefully. Identify foods with trans fats or high amounts of saturated fat.  Fill one half of your plate with vegetables and green salads. Eat 4-5 servings of vegetables per day. A serving of vegetables is:  1 cup of raw leafy vegetables.   cup of raw or cooked cut-up vegetables.   cup of vegetable juice.  Fill one fourth of your plate with whole grains. Look for the word "whole" as the first word in the ingredient list.  Fill one fourth of your plate with lean protein foods.  Eat 4-5 servings of fruit per day. A serving of fruit is:  One medium whole fruit.   cup of dried fruit.   cup of fresh, frozen, or canned fruit.   cup of 100% fruit juice.  Eat more foods that contain soluble fiber. These include apples, broccoli, carrots, beans, peas, and barley. Try to get 20-30 g of fiber per day.  Eat more home-cooked food. Eat less restaurant, buffet, and fast food.  Limit or avoid alcohol.  Limit foods high in starch and sugar.  Avoid fried foods.  Avoid frying your food. Try baking, boiling, grilling, or broiling it instead. You can also reduce fat by:  Removing the skin from poultry.  Removing all visible fats from meats.  Skimming the fat off of stews, soups, and gravies before serving them.  Steaming vegetables in water or broth.  Lose weight if you are overweight.  Eat 4-5 servings of nuts, legumes, and seeds per week:  One serving of dried beans or legumes equals  cup after being cooked.  One serving of nuts equals 1 ounces.  One serving of seeds equals  ounce or one tablespoon.  You may need to keep track of how much salt or sodium you eat. This is especially true if you have high blood pressure. Talk with your doctor or dietitian to get more information. What foods  can I eat? Grains  Breads, including [place], white, pita, wheat, raisin, rye, oatmeal, and [place]. Tortillas that are neither fried nor made with lard or trans fat. Low-fat rolls, including hotdog and hamburger buns and English muffins. Biscuits. Muffins. Waffles. Pancakes. Light popcorn. Whole-grain cereals. Flatbread. Melba toast. Pretzels. Breadsticks. Rusks. Low-fat snacks. Low-fat crackers, including oyster, saltine, matzo, graham, animal, and rye. Rice and pasta, including brown rice and pastas that are made with whole wheat. Vegetables  All vegetables. Fruits  All fruits, but limit coconut. Meats and Other Protein Sources  Lean, well-trimmed beef, veal, pork, and lamb. Chicken and [place] without skin. All fish and shellfish. Wild duck, rabbit, pheasant, and venison. Egg whites or low-cholesterol egg substitutes. Dried beans, peas, lentils, and tofu. Seeds and most nuts. Dairy  Low-fat or nonfat cheeses, including ricotta, string, and mozzarella. Skim or 1% milk that is liquid, powdered, or evaporated. Buttermilk that is made with low-fat milk. Nonfat or low-fat  yogurt. Beverages  Mineral water. Diet carbonated beverages. Sweets and Desserts  Sherbets and fruit ices. Honey, jam, marmalade, jelly, and syrups. Meringues and gelatins. Pure sugar candy, such as hard candy, jelly beans, gumdrops, mints, marshmallows, and small amounts of dark chocolate. [employer] Eat all sweets and desserts in moderation. Fats and Oils  Nonhydrogenated (trans-free) margarines. Vegetable oils, including soybean, sesame, sunflower, olive, peanut, safflower, corn, canola, and cottonseed. Salad dressings or mayonnaise made with a vegetable oil. Limit added fats and oils that you use for cooking, baking, salads, and as spreads. Other  Cocoa powder. Coffee and tea. All seasonings and condiments. The items listed above may not be a complete list of recommended foods or beverages. Contact your dietitian for more  options.  What foods are not recommended? Grains  Breads that are made with saturated or trans fats, oils, or whole milk. Croissants. Butter rolls. Cheese breads. Sweet rolls. Donuts. Buttered popcorn. Chow mein noodles. High-fat crackers, such as cheese or butter crackers. Meats and Other Protein Sources  Fatty meats, such as hotdogs, short ribs, sausage, spareribs, bacon, rib eye roast or steak, and mutton. High-fat deli meats, such as salami and bologna. Caviar. Domestic duck and goose. Organ meats, such as kidney, liver, sweetbreads, and heart. Dairy  Cream, sour cream, cream cheese, and creamed cottage cheese. Whole-milk cheeses, including blue (bleu), [place], [place], [place], [place], [place], Swiss, cheddar, [place], and [place]. Whole or 2% milk that is liquid, evaporated, or condensed. Whole buttermilk. Cream sauce or high-fat cheese sauce. Yogurt that is made from whole milk. Beverages  Regular sodas and juice drinks with added sugar. Sweets and Desserts  Frosting. Pudding. Cookies. Cakes other than angel food cake. Candy that has milk chocolate or white chocolate, hydrogenated fat, butter, coconut, or unknown ingredients. Buttered syrups. Full-fat ice cream or ice cream drinks. Fats and Oils  Gravy that has suet, meat fat, or shortening. Cocoa butter, hydrogenated oils, palm oil, coconut oil, palm kernel oil. These can often be found in baked products, candy, fried foods, nondairy creamers, and whipped toppings. Solid fats and shortenings, including bacon fat, salt pork, lard, and butter. Nondairy cream substitutes, such as coffee creamers and sour cream substitutes. Salad dressings that are made of unknown oils, cheese, or sour cream. The items listed above may not be a complete list of foods and beverages to avoid. Contact your dietitian for more information.  This information is not intended to replace advice given to you by your health care provider. Make sure you discuss any  questions you have with your health care provider. Document Released: 11/17/2011 Document Revised: 10/24/2015 Document Reviewed: 11/09/2013 Elsevier Interactive Patient Education  2017 [place] for Gastroesophageal Reflux Disease, Adult When you have gastroesophageal reflux disease (GERD), the foods you eat and your eating habits are very important. Choosing the right foods can help ease your discomfort. What guidelines do I need to follow?  Choose fruits, vegetables, whole grains, and low-fat dairy products.  Choose low-fat meat, fish, and poultry.  Limit fats such as oils, salad dressings, butter, nuts, and avocado.  Keep a food diary. This helps you identify foods that cause symptoms.  Avoid foods that cause symptoms. These may be different for everyone.  Eat small meals often instead of 3 large meals a day.  Eat your meals slowly, in a place where you are relaxed.  Limit fried foods.  Cook foods using methods other than frying.  Avoid drinking alcohol.  Avoid  drinking large amounts of liquids with your meals.  Avoid bending over or lying down until 2-3 hours after eating. What foods are not recommended? These are some foods and drinks that may make your symptoms worse: Vegetables  Tomatoes. Tomato juice. Tomato and spaghetti sauce. Chili peppers. Onion and garlic. Horseradish. Fruits  Oranges, grapefruit, and lemon (fruit and juice). Meats  High-fat meats, fish, and poultry. This includes hot dogs, ribs, ham, sausage, salami, and bacon. Dairy  Whole milk and chocolate milk. Sour cream. Cream. Butter. Ice cream. Cream cheese. Drinks  Coffee and tea. Bubbly (carbonated) drinks or energy drinks. Condiments  Hot sauce. Barbecue sauce. Sweets/Desserts  Chocolate and cocoa. Donuts. Peppermint and spearmint. Fats and Oils  High-fat foods. This includes [place] fries and potato chips. Other  Vinegar. Strong spices. This includes black pepper,  white pepper, red pepper, cayenne, curry powder, cloves, ginger, and chili powder. The items listed above may not be a complete list of foods and drinks to avoid. Contact your dietitian for more information.  This information is not intended to replace advice given to you by your health care provider. Make sure you discuss any questions you have with your health care provider. Document Released: 11/17/2011 Document Revised: 10/24/2015 Document Reviewed: 03/22/2013 Elsevier Interactive Patient Education  2017 [employer].  -  Steps to Quit Smoking Smoking tobacco can be bad for your health. It can also affect almost every organ in your body. Smoking puts you and people around you at risk for many serious long-lasting (chronic) diseases. Quitting smoking is hard, but it is one of the best things that you can do for your health. It is never too late to quit. What are the benefits of quitting smoking? When you quit smoking, you lower your risk for getting serious diseases and conditions. They can include:  Lung cancer or lung disease.  Heart disease.  Stroke.  Heart attack.  Not being able to have children (infertility).  Weak bones (osteoporosis) and broken bones (fractures). If you have coughing, wheezing, and shortness of breath, those symptoms may get better when you quit. You may also get sick less often. If you are pregnant, quitting smoking can help to lower your chances of having a baby of low birth weight. What can I do to help me quit smoking? Talk with your doctor about what can help you quit smoking. Some things you can do (strategies) include:  Quitting smoking totally, instead of slowly cutting back how much you smoke over a period of time.  Going to in-person counseling. You are more likely to quit if you go to many counseling sessions.  Using resources and support systems, such as:  Online chats with a Social worker.  Phone quitlines.  Printed Social research officer, government.  Support groups or group counseling.  Text messaging programs.  Mobile phone apps or applications.  Taking medicines. Some of these medicines may have nicotine in them. If you are pregnant or breastfeeding, do not take any medicines to quit smoking unless your doctor says it is okay. Talk with your doctor about counseling or other things that can help you. Talk with your doctor about using more than one strategy at the same time, such as taking medicines while you are also going to in-person counseling. This can help make quitting easier. What things can I do to make it easier to quit? Quitting smoking might feel very hard at first, but there is a lot that you can do to make it easier. Take these  steps:  Talk to your family and friends. Ask them to support and encourage you.  Call phone quitlines, reach out to support groups, or work with a Social worker.  Ask people who smoke to not smoke around you.  Avoid places that make you want (trigger) to smoke, such as:  Bars.  Parties.  Smoke-break areas at work.  Spend time with people who do not smoke.  Lower the stress in your life. Stress can make you want to smoke. Try these things to help your stress:  Getting regular exercise.  Deep-breathing exercises.  Yoga.  Meditating.  Doing a body scan. To do this, close your eyes, focus on one area of your body at a time from head to toe, and notice which parts of your body are tense. Try to relax the muscles in those areas.  Download or buy apps on your mobile phone or tablet that can help you stick to your quit plan. There are many free apps, such as QuitGuide from the [employer] Office manager for Disease Control and Prevention). You can find more support from smokefree.gov and other websites. This information is not intended to replace advice given to you by your health care provider. Make sure you discuss any questions you have with your health care provider. Document Released:  03/14/2009 Document Revised: 01/14/2016 Document Reviewed: 10/02/2014 Elsevier Interactive Patient Education  2017 [employer].

## 2016-09-22 NOTE — Progress Notes (Signed)
Samantha [name], is a 36 y.o. female  WNU:[id number]  IHK:[id number]  DOB - [birth year]  No chief complaint on file.       Subjective:   Samantha [name] is a 36 y.o. female here today for a follow up visit of chest pain.  Was in er 09/18/16 for severe epi chest pain, wkup in ER neg troponins, neg ct angio for dvt. Pt does smoke and on nuvaring for birthcontrol. Denies pleuritic cp, sob/doe, but notes still has some mild meg discomfort occasionally, not as severe as on date of her er visit. Meg pain not associated w/ food or movement.  Does not recall acid reflux in past, does not take any meds for it.   Of note, c/o of heaviness on bilat legs, more so on her right than left thigh. Denies calf tenderness, but does get occasional swelling.  Trying to wean down on her metformin, but gets very constipated when she does.  Patient has No headache, No chest pain, No abdominal pain - No Nausea, No new weakness tingling or numbness, No Cough - SOB.  No problems updated.  ALLERGIES: No Known Allergies  PAST MEDICAL HISTORY: Past Medical History:  Diagnosis Date  . Diabetes mellitus without complication ([place])   . Hypertension     MEDICATIONS AT HOME: Prior to Admission medications   Medication Sig Start Date End Date Taking? Authorizing Provider  albuterol (PROVENTIL HFA;VENTOLIN HFA) 108 (90 Base) MCG/ACT inhaler Inhale 2 puffs into the lungs every 6 (six) hours as needed for wheezing or shortness of breath. 09/09/16   Maren Reamer, MD  amLODipine (NORVASC) 10 MG tablet Take 1 tablet (10 mg total) by mouth daily. Must have office visit for refills 09/09/16   Maren Reamer, MD  benzonatate (TESSALON) 100 MG capsule Take 1 capsule (100 mg total) by mouth every 8 (eight) hours. Patient not taking: Reported on 09/09/2016 09/02/16   Fatima Blank, MD  Blood Glucose Monitoring Suppl (ACCU-CHEK AVIVA PLUS) w/Device KIT 1 application by Does not apply route QID. 11/12/15   Maren Reamer, MD  etonogestrel-ethinyl estradiol (NUVARING) 0.12-0.015 MG/24HR vaginal ring INSERT 1 RING VAGINALLY EVERY 28 DAYS, LEAVE IN PLACE FOR 3 CONSECUTIVE WEEKS, THEN REMOVE FOR 1 WEEK 09/09/16   Maren Reamer, MD  glucose blood (ACCU-CHEK COMPACT STRIPS) test strip Use as instructed 11/12/15   Maren Reamer, MD  ibuprofen (ADVIL,MOTRIN) 800 MG tablet Take 1 tablet (800 mg total) by mouth every 8 (eight) hours as needed for mild pain. 01/23/15   Kristen N Ward, DO  Lancets (ACCU-CHEK SOFT TOUCH) lancets Use as instructed 11/12/15   Maren Reamer, MD  lisinopril-hydrochlorothiazide (PRINZIDE,ZESTORETIC) 10-12.5 MG tablet Take 1 tablet by mouth daily. 09/09/16   Maren Reamer, MD  metFORMIN (GLUCOPHAGE-XR) 500 MG 24 hr tablet Take 1 tablet (500 mg total) by mouth 2 (two) times daily with a meal. 09/09/16   Maren Reamer, MD  pantoprazole (PROTONIX) 40 MG tablet Take 1 tablet (40 mg total) by mouth daily. 09/22/16   Maren Reamer, MD  pravastatin (PRAVACHOL) 20 MG tablet Take 1 tablet (20 mg total) by mouth daily. 09/11/16   Maren Reamer, MD  Vitamin D, Ergocalciferol, (DRISDOL) 50000 units CAPS capsule Take 1 capsule (50,000 Units total) by mouth every 7 (seven) days. 09/11/16   Maren Reamer, MD     Objective:   Vitals:   09/22/16 1429  BP: 118/81  Pulse: 80  Resp: 18  Temp: 98.7 F (37.1 C)  TempSrc: Oral  SpO2: 100%  Weight: 209 lb (94.8 kg)  Height: '5\' 2"'  (1.575 m)    Exam General appearance : Awake, alert, not in any distress. Speech Clear. Not toxic looking, morbid obese, pleasant. HEENT: Atraumatic and Normocephalic, pupils equally reactive to light. Neck: supple, no JVD. No cervical lymphadenopathy.  Chest:Good air entry bilaterally, no added sounds. CVS: S1 S2 regular, no murmurs/gallups or rubs. Abdomen: Bowel sounds active, Non tender and not distended with no gaurding, rigidity or rebound. Extremities: B/L Lower Ext shows no edema, both legs are warm  to touch, neg Homan's sign bilat. Neurology: Awake alert, and oriented X 3, CN II-XII grossly intact, Non focal Skin:No Rash  Data Review Lab Results  Component Value Date   HGBA1C 6.1 09/22/2016   HGBA1C 6.1 09/09/2016   HGBA1C 8.1 11/12/2015    Depression screen PHQ 2/9 09/09/2016 07/06/2016 11/12/2015  Decreased Interest 0 0 0  Down, Depressed, Hopeless 0 0 0  PHQ - 2 Score 0 0 0  Altered sleeping - - 0  Tired, decreased energy - - 0  Change in appetite - - 0  Feeling bad or failure about yourself  - - 0  Trouble concentrating - - 0  Moving slowly or fidgety/restless - - 0  Suicidal thoughts - - 0  PHQ-9 Score - - 0  Difficult doing work/chores - - Not difficult at all      Assessment & Plan   1. Type 2 diabetes mellitus with hyperglycemia, without long-term current use of insulin (HCC) - prediabetes range. - would recd weaning down to bid of metformin 500 if able. - Glucose (CBG) - POCT A1C  2. Leg swelling, hx of., +birthcontrol w/ nuvaring and tob + - VAS [place] LOWER EXTREMITY VENOUS (DVT); Future - r/o dvts  3. Gastroesophageal reflux disease with esophagitis - star emperical protonix 40 qd. dw pt gerd diet, goals of short course of ppi, once sx controlled, wean off on ppi as able, long term use of ppi possibly associated w/ ckd, etc. - H. pylori breath test  4. tob abuse Total cessation recd, trying to quit cold [place], down to 5-6 cigs/day - encouraged total cessation given she is on birthcontrol as well, high risk of vtes. For your tobacco abuse: Strongly recommend that he stop smoking back and all other inhaled products right away Call 1-800-Quit-Now to get free nicotine replacement from the state of [place]  5. Constipation when weans down on metformin - needs to increase her fiber intake, ie. Ground flax seed great source of fiber  6. Hyperlipidemia Reviewed her cholesterol w/ her. She wants to try diet/exercise first before starting statin. To address this  please limit saturated fat to no more than 7% of your calories, limit cholesterol to 200 mg/day, increase fiber and exercise as tolerated. If needed we may add cholesterol lowering medication to your regimen.      Patient have been counseled extensively about nutrition and exercise  Return in about 3 months (around 12/22/2016), or if symptoms worsen or fail to improve.  The patient was given clear instructions to go to ER or return to medical center if symptoms don't improve, worsen or new problems develop. The patient verbalized understanding. The patient was told to call to get lab results if they haven't heard anything in the next week.   This note has been created with Surveyor, quantity. Any transcriptional errors are unintentional.  Maren Reamer, MD, [facility] [place], [place]   09/22/2016, 3:26 PM

## 2016-09-22 NOTE — Progress Notes (Signed)
Patient is here for Clots  Patient denies pain at this time.

## 2016-09-24 ENCOUNTER — Ambulatory Visit (HOSPITAL_COMMUNITY)
Admission: RE | Admit: 2016-09-24 | Discharge: 2016-09-24 | Disposition: A | Discharge status: Home / Self Care | Payer: Self-pay | Source: Ambulatory Visit | Attending: Internal Medicine | Admitting: Internal Medicine

## 2016-09-24 DIAGNOSIS — M79609 Pain in unspecified limb: Secondary | ICD-10-CM | POA: Insufficient documentation

## 2016-09-24 DIAGNOSIS — M7989 Other specified soft tissue disorders: Secondary | ICD-10-CM | POA: Insufficient documentation

## 2016-09-24 LAB — H.PYLORI BREATH TEST (REFLEX): H. pylori Breath Test: NEGATIVE

## 2016-09-24 LAB — H. PYLORI BREATH TEST

## 2016-09-24 NOTE — Progress Notes (Signed)
*  Preliminary Results* Bilateral lower extremity venous duplex completed. Bilateral lower extremities are negative for deep vein thrombosis. There is no evidence of Baker's cyst bilaterally.  09/24/2016 3:07 PM Maudry Mayhew, BS, RVT, RDCS, RDMS

## 2016-09-29 ENCOUNTER — Telehealth: Payer: Self-pay

## 2016-09-29 NOTE — Telephone Encounter (Signed)
Contacted pt to go over lab results pt is aware and doesn't have any questions or concerns 

## 2016-09-29 NOTE — Telephone Encounter (Signed)
Contacted pt to go Vas [place] pt is aware of results and doesn't have any questions or concerns

## 2016-10-15 ENCOUNTER — Encounter: Payer: Self-pay | Admitting: Internal Medicine

## 2016-10-16 ENCOUNTER — Encounter: Payer: Self-pay | Admitting: Internal Medicine

## 2016-10-19 ENCOUNTER — Encounter: Payer: Self-pay | Admitting: Internal Medicine

## 2016-12-10 ENCOUNTER — Ambulatory Visit: Payer: Self-pay | Admitting: Family Medicine

## 2016-12-15 ENCOUNTER — Ambulatory Visit: Payer: Self-pay | Attending: Internal Medicine | Admitting: *Deleted

## 2016-12-15 DIAGNOSIS — I10 Essential (primary) hypertension: Secondary | ICD-10-CM

## 2016-12-15 DIAGNOSIS — Z0130 Encounter for examination of blood pressure without abnormal findings: Secondary | ICD-10-CM

## 2016-12-15 DIAGNOSIS — R42 Dizziness and giddiness: Secondary | ICD-10-CM | POA: Insufficient documentation

## 2016-12-15 NOTE — Progress Notes (Signed)
Patient Triage Assessment Form Todays Date: 12/15/2016 Name: Samantha [name] DOB: 03-Feb-1981 Reason for walkin: feels light headed When did your symptoms start:  Please list symptoms: Light headed and dizzy after taking medication, Amlodipine when getting it from the pharmacy. Has been out of medication. Last medication was taken on Friday. She took medication at home and proceeded to [facility]   Are you having pain: no   Assessment Pt alert and oriented x 4.  Speech is clear, no facial assymetry  NAD assessed Gait is steady Pupils are round and reactive to light Grip: Left- present and strong          Right- present and moderate Resistance of BUE WNL   Vital Signs:  T: P: 96 R:16 SpO2: 97 BP:127/87  Plan Unable to schedule appointment with provider today due full schedule. Pt aware to go to Urgent Care of ED if symptoms progress. If not improving  While on medication, to return for appointment with PCP.

## 2016-12-16 ENCOUNTER — Ambulatory Visit: Payer: Self-pay | Admitting: Family Medicine

## 2017-01-01 ENCOUNTER — Ambulatory Visit: Payer: Self-pay | Admitting: Family Medicine

## 2017-02-02 ENCOUNTER — Encounter (HOSPITAL_COMMUNITY): Payer: Self-pay | Admitting: Emergency Medicine

## 2017-02-02 ENCOUNTER — Ambulatory Visit (HOSPITAL_COMMUNITY)
Admission: EM | Admit: 2017-02-02 | Discharge: 2017-02-02 | Disposition: A | Discharge status: Home / Self Care | Payer: Self-pay | Attending: Family Medicine | Admitting: Family Medicine

## 2017-02-02 DIAGNOSIS — J00 Acute nasopharyngitis [common cold]: Secondary | ICD-10-CM

## 2017-02-02 MED ORDER — CETIRIZINE-PSEUDOEPHEDRINE ER 5-120 MG PO TB12: 1.0000 | ORAL_TABLET | Freq: Every day | ORAL | 0 refills | Status: DC

## 2017-02-02 MED ORDER — BENZONATATE 100 MG PO CAPS: 100.0000 mg | ORAL_CAPSULE | Freq: Three times a day (TID) | ORAL | 0 refills | Status: DC

## 2017-02-02 MED ORDER — FLUTICASONE PROPIONATE 50 MCG/ACT NA SUSP: 2.0000 | Freq: Every day | NASAL | 0 refills | Status: DC

## 2017-02-02 NOTE — ED Provider Notes (Signed)
[place]    CSN: [id number] Arrival date & time: 02/02/17  1019     History   Chief Complaint Chief Complaint  Patient presents with  . Fatigue  . Cough    HPI Samantha [name] is a 36 y.o. female.   36 year old female with history of diabetes or hypertension comes in for 1 day history of cough, nasal congestion, night sweats, fatigue. She states she woke up with wheezing last night. Has chest pain during cough, and feels a lot of chest congestion. Has not taken anything for symptoms. She has tried to be hydrated due to night sweats. Denies fever, chills. Denies abdominal pain, nausea, vomiting, diarrhea. States has some sore throat/irritation. Denies ear pain, eye pain. Her diabetes is well controlled, last A1c 6.1. She is every day smoker, with less than 2 pack year history. She denies history of asthma, COPD, seasonal allergies.      Past Medical History:  Diagnosis Date  . Diabetes mellitus without complication ([place])   . Hypertension     Patient Active Problem List   Diagnosis Date Noted  . Morbid obesity ([place]) 07/06/2016  . Type 2 diabetes mellitus with hyperglycemia ([place]) 11/12/2015  . Essential hypertension 11/04/2014    History reviewed. No pertinent surgical history.  OB History    Gravida Para Term Preterm AB Living   '9 7 7   1     ' SAB TAB Ectopic Multiple Live Births   1               Home Medications    Prior to Admission medications   Medication Sig Start Date End Date Taking? Authorizing Provider  albuterol (PROVENTIL HFA;VENTOLIN HFA) 108 (90 Base) MCG/ACT inhaler Inhale 2 puffs into the lungs every 6 (six) hours as needed for wheezing or shortness of breath. 09/09/16   Maren Reamer, MD  amLODipine (NORVASC) 10 MG tablet Take 1 tablet (10 mg total) by mouth daily. Must have office visit for refills 09/09/16   Lottie Mussel T, MD  benzonatate (TESSALON) 100 MG capsule Take 1 capsule (100 mg total) by mouth every 8 (eight) hours.  02/02/17   Tasia Catchings, Shambria Camerer V, PA-C  Blood Glucose Monitoring Suppl (ACCU-CHEK AVIVA PLUS) w/Device KIT 1 application by Does not apply route QID. 11/12/15   Langeland, Dawn T, MD  cetirizine-pseudoephedrine (ZYRTEC-D) 5-120 MG tablet Take 1 tablet by mouth daily. 02/02/17   Ok Edwards, PA-C  etonogestrel-ethinyl estradiol (NUVARING) 0.12-0.015 MG/24HR vaginal ring INSERT 1 RING VAGINALLY EVERY 28 DAYS, LEAVE IN PLACE FOR 3 CONSECUTIVE WEEKS, THEN REMOVE FOR 1 WEEK 09/09/16   Langeland, Dawn T, MD  fluticasone (FLONASE) 50 MCG/ACT nasal spray Place 2 sprays into both nostrils daily. 02/02/17   Tasia Catchings, Alyn Jurney V, PA-C  glucose blood (ACCU-CHEK COMPACT STRIPS) test strip Use as instructed 11/12/15   Lottie Mussel T, MD  ibuprofen (ADVIL,MOTRIN) 800 MG tablet Take 1 tablet (800 mg total) by mouth every 8 (eight) hours as needed for mild pain. 01/23/15   Ward, Delice Bison, DO  Lancets (ACCU-CHEK SOFT TOUCH) lancets Use as instructed 11/12/15   Lottie Mussel T, MD  lisinopril-hydrochlorothiazide (PRINZIDE,ZESTORETIC) 10-12.5 MG tablet Take 1 tablet by mouth daily. 09/09/16   Maren Reamer, MD  metFORMIN (GLUCOPHAGE-XR) 500 MG 24 hr tablet Take 1 tablet (500 mg total) by mouth 2 (two) times daily with a meal. 09/09/16   Langeland, Dawn T, MD  pantoprazole (PROTONIX) 40 MG tablet Take 1 tablet (40 mg total) by  mouth daily. Patient not taking: Reported on 12/15/2016 09/22/16   Lottie Mussel T, MD  pravastatin (PRAVACHOL) 20 MG tablet Take 1 tablet (20 mg total) by mouth daily. Patient not taking: Reported on 12/15/2016 09/11/16   Maren Reamer, MD  Vitamin D, Ergocalciferol, (DRISDOL) 50000 units CAPS capsule Take 1 capsule (50,000 Units total) by mouth every 7 (seven) days. Patient not taking: Reported on 12/15/2016 09/11/16   Maren Reamer, MD    Family History Family History  Problem Relation Age of Onset  . Cancer Father     Social History Social History  Substance Use Topics  . Smoking status: Current Every Day  Smoker    Packs/day: 0.50    Years: 0.40    Types: Cigarettes  . Smokeless tobacco: Never Used  . Alcohol use No     Allergies   Patient has no known allergies.   Review of Systems Review of Systems  Reason unable to perform ROS: See HPI as above.     Physical Exam Triage Vital Signs ED Triage Vitals  Enc Vitals Group     BP 02/02/17 1104 (!) 141/97     Pulse Rate 02/02/17 1104 91     Resp 02/02/17 1104 16     Temp 02/02/17 1104 98.4 F (36.9 C)     Temp Source 02/02/17 1104 Oral     SpO2 02/02/17 1104 100 %     Weight 02/02/17 1112 190 lb (86.2 kg)     Height 02/02/17 1112 '5\' 2"'  (1.575 m)     Head Circumference --      Peak Flow --      Pain Score 02/02/17 1112 7     Pain Loc --      Pain Edu? --      Excl. in [place]? --    No data found.   Updated Vital Signs BP (!) 141/97 (BP Location: Right Arm)   Pulse 91   Temp 98.4 F (36.9 C) (Oral)   Resp 16   Ht '5\' 2"'  (1.575 m)   Wt 190 lb (86.2 kg)   SpO2 100%   BMI 34.75 kg/m      Physical Exam  Constitutional: She is oriented to person, place, and time. She appears well-developed and well-nourished. No distress.  HENT:  Head: Normocephalic and atraumatic.  Right Ear: Tympanic membrane, external ear and ear canal normal. Tympanic membrane is not erythematous and not bulging.  Left Ear: Tympanic membrane, external ear and ear canal normal. Tympanic membrane is not erythematous and not bulging.  Nose: Mucosal edema and rhinorrhea present. Right sinus exhibits no maxillary sinus tenderness and no frontal sinus tenderness. Left sinus exhibits no maxillary sinus tenderness and no frontal sinus tenderness.  Mouth/Throat: Uvula is midline, oropharynx is clear and moist and mucous membranes are normal. No posterior oropharyngeal edema or posterior oropharyngeal erythema.  Eyes: Pupils are equal, round, and reactive to light. Conjunctivae are normal.  Neck: Normal range of motion. Neck supple.  Cardiovascular: Normal  rate, regular rhythm and normal heart sounds.  Exam reveals no gallop and no friction rub.   No murmur heard. Pulmonary/Chest: Effort normal and breath sounds normal. She has no decreased breath sounds. She has no wheezes. She has no rhonchi. She has no rales.  Lymphadenopathy:    She has no cervical adenopathy.  Neurological: She is alert and oriented to person, place, and time.  Skin: Skin is warm and dry.  Psychiatric: She has a normal mood  and affect. Her behavior is normal. Judgment normal.     UC Treatments / Results  Labs (all labs ordered are listed, but only abnormal results are displayed) Labs Reviewed - No data to display  EKG  EKG Interpretation None       Radiology No results found.  Procedures Procedures (including critical care time)  Medications Ordered in UC Medications - No data to display   Initial Impression / Assessment and Plan / UC Course  I have reviewed the triage vital signs and the nursing notes.  Pertinent labs & imaging results that were available during my care of the patient were reviewed by me and considered in my medical decision making (see chart for details).    Discussed with patient history and exam most consistent with viral URI. Symptomatic treatment as needed. Push fluids. Discussed with patient wheezing most likely due to chest congestion, lung clear to auscultation bilaterally without adventitious lung sounds. Patient without current wheezing, shortness of breath. Return precautions given.   Final Clinical Impressions(s) / UC Diagnoses   Final diagnoses:  Acute nasopharyngitis    New Prescriptions New Prescriptions   BENZONATATE (TESSALON) 100 MG CAPSULE    Take 1 capsule (100 mg total) by mouth every 8 (eight) hours.   CETIRIZINE-PSEUDOEPHEDRINE (ZYRTEC-D) 5-120 MG TABLET    Take 1 tablet by mouth daily.   FLUTICASONE (FLONASE) 50 MCG/ACT NASAL SPRAY    Place 2 sprays into both nostrils daily.       Ok Edwards,  PA-C 02/02/17 1205

## 2017-02-02 NOTE — Discharge Instructions (Addendum)
Tessalon for cough. Start flonase, zyrtec-D for nasal congestion. You can use over the counter nasal saline rinse such as neti pot for nasal congestion. Keep hydrated, your urine should be clear to pale yellow in color. Tylenol/motrin for fever and pain. Monitor for any worsening of symptoms, chest pain, shortness of breath, wheezing, swelling of the throat, follow up for reevaluation.

## 2017-02-02 NOTE — ED Triage Notes (Signed)
PT reports fatigue, sore throat, nasal congestion, and night sweats that started yesterday.

## 2017-04-20 ENCOUNTER — Encounter: Payer: Self-pay | Admitting: Internal Medicine

## 2017-04-26 ENCOUNTER — Ambulatory Visit: Payer: Self-pay | Attending: Nurse Practitioner | Admitting: Nurse Practitioner

## 2017-04-26 ENCOUNTER — Encounter: Payer: Self-pay | Admitting: Nurse Practitioner

## 2017-04-26 VITALS — BP 138/89 | HR 94 | Temp 98.6°F | Resp 18 | Ht 62.0 in | Wt 204.6 lb

## 2017-04-26 DIAGNOSIS — E785 Hyperlipidemia, unspecified: Secondary | ICD-10-CM | POA: Insufficient documentation

## 2017-04-26 DIAGNOSIS — Z7984 Long term (current) use of oral hypoglycemic drugs: Secondary | ICD-10-CM | POA: Insufficient documentation

## 2017-04-26 DIAGNOSIS — K2100 Gastro-esophageal reflux disease with esophagitis, without bleeding: Secondary | ICD-10-CM

## 2017-04-26 DIAGNOSIS — I10 Essential (primary) hypertension: Secondary | ICD-10-CM | POA: Insufficient documentation

## 2017-04-26 DIAGNOSIS — E1169 Type 2 diabetes mellitus with other specified complication: Secondary | ICD-10-CM

## 2017-04-26 DIAGNOSIS — Z6837 Body mass index (BMI) 37.0-37.9, adult: Secondary | ICD-10-CM | POA: Insufficient documentation

## 2017-04-26 DIAGNOSIS — Z79899 Other long term (current) drug therapy: Secondary | ICD-10-CM | POA: Insufficient documentation

## 2017-04-26 DIAGNOSIS — E1165 Type 2 diabetes mellitus with hyperglycemia: Secondary | ICD-10-CM | POA: Insufficient documentation

## 2017-04-26 LAB — POCT GLYCOSYLATED HEMOGLOBIN (HGB A1C): HEMOGLOBIN A1C: 6.4

## 2017-04-26 LAB — GLUCOSE, POCT (MANUAL RESULT ENTRY): POC GLUCOSE: 188 mg/dL — AB (ref 70–99)

## 2017-04-26 MED ORDER — TRUE METRIX METER W/DEVICE KIT: PACK | 0 refills | Status: DC

## 2017-04-26 MED ORDER — SITAGLIPTIN PHOS-METFORMIN HCL 50-500 MG PO TABS: 1.0000 | ORAL_TABLET | Freq: Every day | ORAL | 3 refills | Status: DC

## 2017-04-26 MED ORDER — GLUCOSE BLOOD VI STRP: ORAL_STRIP | 12 refills | Status: DC

## 2017-04-26 MED ORDER — TRUEPLUS LANCETS 28G MISC: 3 refills | Status: DC

## 2017-04-26 MED ORDER — AMLODIPINE BESYLATE 10 MG PO TABS: 10.0000 mg | ORAL_TABLET | Freq: Every day | ORAL | 3 refills | Status: DC

## 2017-04-26 MED ORDER — LISINOPRIL-HYDROCHLOROTHIAZIDE 10-12.5 MG PO TABS: 1.0000 | ORAL_TABLET | Freq: Every day | ORAL | 3 refills | Status: DC

## 2017-04-26 MED ORDER — PRAVASTATIN SODIUM 20 MG PO TABS: 20.0000 mg | ORAL_TABLET | Freq: Every day | ORAL | 3 refills | Status: DC

## 2017-04-26 MED ORDER — ETONOGESTREL-ETHINYL ESTRADIOL 0.12-0.015 MG/24HR VA RING: VAGINAL_RING | VAGINAL | 3 refills | Status: DC

## 2017-04-26 NOTE — Progress Notes (Signed)
Assessment & Plan:  Samantha [name] was seen today for establish care.  Diagnoses and all orders for this visit:  Type 2 diabetes mellitus with hyperglycemia, without long-term current use of insulin (HCC) -     Glucose (CBG) -     HgB A1c -     Ambulatory referral to diabetic education -     Blood Glucose Monitoring Suppl (TRUE METRIX METER) w/Device KIT; Please use as instructed -     glucose blood (TRUE METRIX BLOOD GLUCOSE TEST) test strip; Use as instructed -     Basic metabolic panel -     CBC -     Microalbumin / creatinine urine ratio -     Lipid panel -     sitaGLIPtin-metformin (JANUMET) 50-500 MG tablet; Take 1 tablet by mouth daily for 90 doses.  Essential hypertension -     TRUEPLUS LANCETS 28G MISC; Please use as instructed -     Microalbumin / creatinine urine ratio -     lisinopril-hydrochlorothiazide (PRINZIDE,ZESTORETIC) 10-12.5 MG tablet; Take 1 tablet by mouth daily. -     amLODipine (NORVASC) 10 MG tablet; Take 1 tablet (10 mg total) by mouth daily. Must have office visit for refills  Morbid obesity ([place]) Discussed diet and exercise for person with BMI >25. Instructed: You must burn more calories than you eat. Losing 5 percent of your body weight should be considered a success. In the longer term, losing more than 15 percent of your body weight and staying at this weight is an extremely good result. However, keep in mind that even losing 5 percent of your body weight leads to important health benefits, so try not to get discouraged if you're not able to lose more than this. Will recheck weight in 3-6 months.  Gastroesophageal reflux disease with esophagitis Patient reports GERD controlled with OTC acid reducing medications.   Hyperlipidemia associated with type 2 diabetes mellitus (HCC) -     pravastatin (PRAVACHOL) 20 MG tablet; Take 1 tablet (20 mg total) by mouth daily. Exercise at least 150 minutes per week Continue to work on low fat, heart healthy diet and  participate in regular aerobic exercise program to control as well.  Other orders -     etonogestrel-ethinyl estradiol (NUVARING) 0.12-0.015 MG/24HR vaginal ring; INSERT 1 RING VAGINALLY EVERY 28 DAYS, LEAVE IN PLACE FOR 3 CONSECUTIVE WEEKS, THEN REMOVE FOR 1 WEEK    Patient has been counseled on age-appropriate routine health concerns for screening and prevention. These are reviewed and up-to-date. Referrals have been placed accordingly. Immunizations are up-to-date or declined.  She would like to defer referral to ophthalmology due to her insurance coverage not being effective until 06-01-2017.     Subjective:   Chief Complaint  Patient presents with  . Establish Care    Patient is here for Diabetes and need medication refills.   Patient seen in office today for follow up of Diabetes, HTN and HPL. She is also requesting a refill of her birth control today.   Hypertension She is not exercising and is not adherent to low salt diet.  She does not have a blood pressure log  today. Reports she does not have a blood pressure cuff at home to monitor her blood pressure.  Cardiac symptoms none. Patient denies chest pain, chest pressure/discomfort, exertional chest pressure/discomfort, fatigue, irregular heart beat, lower extremity edema, palpitations, paroxysmal nocturnal dyspnea and tachypnea.  Cardiovascular risk factors: smoking history, dyslipidemia and hypertension. Use of agents associated with hypertension:  none.  History of target organ damage: none. BP Readings from Last 3 Encounters: 04/26/17 : 138/89 02/02/17 : (!) 141/97 09/22/16 : 118/81   Hyperlipidemia She IS NOT medication or diet compliant and denies exertional chest pressure/discomfort, fatigue and skin xanthelasma or statin intolerance including myalgias.  Lab Results      Component                Value               Date                      CHOL                     219 (H)             09/09/2016           Lab Results       Component                Value               Date                      HDL                      56                  09/09/2016           Lab Results      Component                Value               Date                      LDLCALC                  88                  09/09/2016           Lab Results      Component                Value               Date                      TRIG                     377 (H)             09/09/2016           Lab Results      Component                Value               Date                      CHOLHDL                  3.9                 09/09/2016             Diabetes Mellitus Type II Current symptoms/problems include none and have been unchanged.  She has not been checking her blood sugars and reports she can not find her glucometer for several months now.  Known diabetic complications: none Cardiovascular risk factors: diabetes mellitus, dyslipidemia, hypertension, obesity (BMI >= 30 kg/m2) and smoking/ tobacco exposure Current diabetic medications include:  Eye exam current (within one year): no Weight trend: fluctuating a bit Prior visit with dietician: no Current monitoring regimen: none Home blood sugar records: unknown Any episodes of hypoglycemia? no Is She on ACE inhibitor or angiotensin II receptor blocker?  Yes  Lab Results      Component                Value               Date                      HGBA1C                   6.4                 04/26/2017                HGBA1C                   6.1                 09/22/2016                HGBA1C                   6.1                 09/09/2016                 Review of Systems  Constitutional: Negative for fever, malaise/fatigue and weight loss.  HENT: Negative.  Negative for nosebleeds.   Eyes: Negative.  Negative for blurred vision, double vision and photophobia.  Respiratory: Negative.  Negative for cough and shortness of breath.   Cardiovascular: Negative.  Negative for chest  pain, palpitations and leg swelling.  Gastrointestinal: Positive for heartburn. Negative for abdominal pain, constipation, diarrhea, nausea and vomiting.  Musculoskeletal: Negative.  Negative for myalgias.  Neurological: Negative.  Negative for dizziness, focal weakness, seizures and headaches.  Endo/Heme/Allergies: Negative for environmental allergies.  Psychiatric/Behavioral: Negative.  Negative for suicidal ideas.    Past Medical History:  Diagnosis Date  . Diabetes mellitus without complication ([place])   . Hypertension     History reviewed. No pertinent surgical history.  Family History  Problem Relation Age of Onset  . Cancer Father     Social History Reviewed with no changes to be made today.   Outpatient Medications Prior to Visit  Medication Sig Dispense Refill  . albuterol (PROVENTIL HFA;VENTOLIN HFA) 108 (90 Base) MCG/ACT inhaler Inhale 2 puffs into the lungs every 6 (six) hours as needed for wheezing or shortness of breath. 1 Inhaler 2  . Lancets (ACCU-CHEK SOFT TOUCH) lancets Use as instructed 100 each 12  . amLODipine (NORVASC) 10 MG tablet Take 1 tablet (10 mg total) by mouth daily. Must have office visit for refills 90 tablet 3  . Blood Glucose Monitoring Suppl (ACCU-CHEK AVIVA PLUS) w/Device KIT 1 application by Does not apply route QID. 1 kit 0  . etonogestrel-ethinyl estradiol (NUVARING) 0.12-0.015 MG/24HR vaginal ring INSERT 1 RING VAGINALLY EVERY 28 DAYS, LEAVE IN PLACE FOR 3 CONSECUTIVE WEEKS, THEN REMOVE FOR 1 WEEK  3 each 3  . glucose blood (ACCU-CHEK COMPACT STRIPS) test strip Use as instructed 100 each 12  . lisinopril-hydrochlorothiazide (PRINZIDE,ZESTORETIC) 10-12.5 MG tablet Take 1 tablet by mouth daily. 90 tablet 3  . metFORMIN (GLUCOPHAGE-XR) 500 MG 24 hr tablet Take 1 tablet (500 mg total) by mouth 2 (two) times daily with a meal. 180 tablet 3  . cetirizine-pseudoephedrine (ZYRTEC-D) 5-120 MG tablet Take 1 tablet by mouth daily. (Patient not taking:  Reported on 04/26/2017) 30 tablet 0  . fluticasone (FLONASE) 50 MCG/ACT nasal spray Place 2 sprays into both nostrils daily. (Patient not taking: Reported on 04/26/2017) 1 g 0  . ibuprofen (ADVIL,MOTRIN) 800 MG tablet Take 1 tablet (800 mg total) by mouth every 8 (eight) hours as needed for mild pain. (Patient not taking: Reported on 04/26/2017) 30 tablet 0  . pantoprazole (PROTONIX) 40 MG tablet Take 1 tablet (40 mg total) by mouth daily. (Patient not taking: Reported on 12/15/2016) 30 tablet 3  . Vitamin D, Ergocalciferol, (DRISDOL) 50000 units CAPS capsule Take 1 capsule (50,000 Units total) by mouth every 7 (seven) days. (Patient not taking: Reported on 12/15/2016) 12 capsule 0  . benzonatate (TESSALON) 100 MG capsule Take 1 capsule (100 mg total) by mouth every 8 (eight) hours. (Patient not taking: Reported on 04/26/2017) 21 capsule 0  . pravastatin (PRAVACHOL) 20 MG tablet Take 1 tablet (20 mg total) by mouth daily. (Patient not taking: Reported on 12/15/2016) 90 tablet 3   Facility-Administered Medications Prior to Visit  Medication Dose Route Frequency Provider Last Rate Last Dose  . cloNIDine (CATAPRES) tablet 0.2 mg  0.2 mg Oral Once Langeland, Dawn T, MD        No Known Allergies     Objective:    BP 138/89 (BP Location: Right Arm, Patient Position: Sitting, Cuff Size: Normal)   Pulse 94   Temp 98.6 F (37 C) (Oral)   Resp 18   Ht '5\' 2"'  (1.575 m)   Wt 204 lb 9.6 oz (92.8 kg)   SpO2 97%   BMI 37.42 kg/m  Wt Readings from Last 3 Encounters:  04/26/17 204 lb 9.6 oz (92.8 kg)  02/02/17 190 lb (86.2 kg)  09/22/16 209 lb (94.8 kg)    Physical Exam  Constitutional: She is oriented to person, place, and time. She appears well-developed and well-nourished. She is cooperative.  HENT:  Head: Normocephalic and atraumatic.  Eyes: EOM are normal.  Neck: Normal range of motion.  Cardiovascular: Normal rate, regular rhythm, normal heart sounds and intact distal pulses. Exam reveals no  gallop and no friction rub.  No murmur heard. Pulmonary/Chest: Effort normal and breath sounds normal. No tachypnea. No respiratory distress. She has no decreased breath sounds. She has no wheezes. She has no rhonchi. She has no rales. She exhibits no tenderness.  Abdominal: Soft. Bowel sounds are normal.  Musculoskeletal: Normal range of motion. She exhibits no edema.  Neurological: She is alert and oriented to person, place, and time. Coordination normal.  Skin: Skin is warm and dry.  Psychiatric: She has a normal mood and affect. Her behavior is normal. Judgment and thought content normal.  Nursing note and vitals reviewed.    Patient has been counseled extensively about nutrition and exercise as well as the importance of adherence with medications and regular follow-up. The patient was given clear instructions to go to ER or return to medical center if symptoms don't improve, worsen or new problems develop. The patient verbalized understanding.   Follow-up: Return  in about 3 months (around 07/27/2017) for HTN/HPL/DM.    [name], FNP-BC [facility] [place], [place]   04/26/2017, 10:34 PM

## 2017-04-27 LAB — LIPID PANEL
Chol/HDL Ratio: 3.8 ratio (ref 0.0–4.4)
Cholesterol, Total: 212 mg/dL — ABNORMAL HIGH (ref 100–199)
HDL: 56 mg/dL (ref >39)
TRIGLYCERIDES: 417 mg/dL — AB (ref 0–149)

## 2017-04-27 LAB — BASIC METABOLIC PANEL
BUN/Creatinine Ratio: 16 (ref 9–23)
BUN: 12 mg/dL (ref 6–20)
CO2: 24 mmol/L (ref 20–29)
CREATININE: 0.77 mg/dL (ref 0.57–1.00)
Calcium: 9.5 mg/dL (ref 8.7–10.2)
Chloride: 98 mmol/L (ref 96–106)
GFR calc Af Amer: 115 mL/min/{1.73_m2} (ref >59)
GFR calc non Af Amer: 100 mL/min/{1.73_m2} (ref >59)
GLUCOSE: 171 mg/dL — AB (ref 65–99)
Potassium: 3.5 mmol/L (ref 3.5–5.2)
SODIUM: 138 mmol/L (ref 134–144)

## 2017-04-27 LAB — CBC
Hematocrit: 39.1 % (ref 34.0–46.6)
Hemoglobin: 13.4 g/dL (ref 11.1–15.9)
MCH: 28.5 pg (ref 26.6–33.0)
MCHC: 34.3 g/dL (ref 31.5–35.7)
MCV: 83 fL (ref 79–97)
Platelets: 367 10*3/uL (ref 150–379)
RBC: 4.71 x10E6/uL (ref 3.77–5.28)
RDW: 13.8 % (ref 12.3–15.4)
WBC: 10.7 10*3/uL (ref 3.4–10.8)

## 2017-04-27 LAB — MICROALBUMIN / CREATININE URINE RATIO
CREATININE, UR: 68.3 mg/dL
Microalb/Creat Ratio: <4.4 mg/g creat (ref 0.0–30.0)
Microalbumin, Urine: <3 ug/mL

## 2017-04-28 ENCOUNTER — Telehealth: Payer: Self-pay

## 2017-04-28 NOTE — Telephone Encounter (Signed)
-----   Message from [name], NP sent at 04/27/2017  9:29 PM EST ----- Your cholesterol levels are elevated and your triglycerides as well. I would like for you to make sure you are taking your pravachol every night. Your other labs besides your glucose are fairly normal. Continue to work on a low fat/low chol diet and exercise at least 30 minutes a day/5 days a week. You can get exercise in just by walking.

## 2017-04-28 NOTE — Telephone Encounter (Signed)
Patient called back and informed on lab result. Patient is informed to take her cholesterol medication every night.   Patient verified DOB.

## 2017-05-13 ENCOUNTER — Ambulatory Visit: Payer: Medicaid Other | Admitting: Registered"

## 2017-05-26 ENCOUNTER — Ambulatory Visit: Payer: Medicaid Other | Admitting: Registered"

## 2017-06-07 ENCOUNTER — Encounter: Payer: Self-pay | Admitting: Pharmacist

## 2017-06-07 NOTE — Progress Notes (Signed)
PA submitted for Janumet. Pending approval by [place] [employer]. Confirmation #[id number]

## 2017-07-19 ENCOUNTER — Other Ambulatory Visit: Payer: Self-pay | Admitting: Internal Medicine

## 2017-07-28 ENCOUNTER — Ambulatory Visit: Payer: BC Managed Care – PPO | Attending: Nurse Practitioner | Admitting: Nurse Practitioner

## 2017-07-28 ENCOUNTER — Encounter: Payer: Self-pay | Admitting: Nurse Practitioner

## 2017-07-28 VITALS — BP 133/79 | HR 101 | Temp 98.6°F | Ht 62.0 in | Wt 211.0 lb

## 2017-07-28 DIAGNOSIS — Z6838 Body mass index (BMI) 38.0-38.9, adult: Secondary | ICD-10-CM | POA: Diagnosis not present

## 2017-07-28 DIAGNOSIS — I10 Essential (primary) hypertension: Secondary | ICD-10-CM | POA: Diagnosis not present

## 2017-07-28 DIAGNOSIS — Z809 Family history of malignant neoplasm, unspecified: Secondary | ICD-10-CM | POA: Diagnosis not present

## 2017-07-28 DIAGNOSIS — F419 Anxiety disorder, unspecified: Secondary | ICD-10-CM | POA: Diagnosis not present

## 2017-07-28 DIAGNOSIS — E1165 Type 2 diabetes mellitus with hyperglycemia: Secondary | ICD-10-CM

## 2017-07-28 DIAGNOSIS — Z7984 Long term (current) use of oral hypoglycemic drugs: Secondary | ICD-10-CM | POA: Diagnosis not present

## 2017-07-28 DIAGNOSIS — Z760 Encounter for issue of repeat prescription: Secondary | ICD-10-CM | POA: Diagnosis not present

## 2017-07-28 DIAGNOSIS — Z79899 Other long term (current) drug therapy: Secondary | ICD-10-CM | POA: Diagnosis not present

## 2017-07-28 DIAGNOSIS — Z9119 Patient's noncompliance with other medical treatment and regimen: Secondary | ICD-10-CM | POA: Insufficient documentation

## 2017-07-28 LAB — GLUCOSE, POCT (MANUAL RESULT ENTRY): POC GLUCOSE: 192 mg/dL — AB (ref 70–99)

## 2017-07-28 MED ORDER — SITAGLIPTIN PHOS-METFORMIN HCL 50-500 MG PO TABS: 1.0000 | ORAL_TABLET | Freq: Every day | ORAL | 3 refills | Status: DC

## 2017-07-28 MED ORDER — BUSPIRONE HCL 5 MG PO TABS: 10.0000 mg | ORAL_TABLET | Freq: Three times a day (TID) | ORAL | 1 refills | Status: DC | PRN

## 2017-07-28 MED ORDER — PRAVASTATIN SODIUM 20 MG PO TABS: 20.0000 mg | ORAL_TABLET | Freq: Every day | ORAL | 3 refills | Status: DC

## 2017-07-28 NOTE — Progress Notes (Signed)
Assessment & Plan:  Samantha [name] was seen today for follow-up and medication refill.  Diagnoses and all orders for this visit:  Type 2 diabetes mellitus with hyperglycemia, without long-term current use of insulin (HCC) -     Glucose (CBG) -     pravastatin (PRAVACHOL) 20 MG tablet; Take 1 tablet (20 mg total) by mouth daily. -     sitaGLIPtin-metformin (JANUMET) 50-500 MG tablet; Take 1 tablet by mouth daily for 90 doses. Diabetes Mellitus, Type 2 : Controlled Continue medications. Keep blood sugar logs as instructed.   Essential hypertension Continue all antihypertensives as prescribed.  Remember to bring in your blood pressure log with you for your follow up appointment.  DASH/Mediterranean Diets are healthier choices for HTN.    Morbid obesity ([place]) Discussed diet and exercise for person with BMI >25. Instructed: You must burn more calories than you eat. Losing 5 percent of your body weight should be considered a success. In the longer term, losing more than 15 percent of your body weight and staying at this weight is an extremely good result. However, keep in mind that even losing 5 percent of your body weight leads to important health benefits, so try not to get discouraged if you're not able to lose more than this. Will recheck weight in 3-6 months.  Anxiety -     busPIRone (BUSPAR) 5 MG tablet; Take 2 tablets (10 mg total) by mouth 3 (three) times daily as needed.    Patient has been counseled on age-appropriate routine health concerns for screening and prevention. These are reviewed and up-to-date. Referrals have been placed accordingly. Immunizations are up-to-date or declined.    Subjective:   Chief Complaint  Patient presents with  . Follow-up    Patient is here for a follow-up.   . Medication Refill    Patient need medication refills.    HPI   Samantha [name] 37 y.o. female presents to office today for follow up.  Anxiety She is very tearful today.  Endorsing  increased stress and anxiety with her home/personal life.  Her partner whom she is living with is not consistently helping her out financially and she feels overwhelmed with all of the financial responsibilities as well as the being the primary caretaker of all the children in the home.  We discussed SSRIs today.  She declines SSRI and requests a medication to help with her anxiety.  Will start BuSpar today and follow-up in a few weeks.  She denies any suicidal or homicidal ideation today.  Depression screen [facility] 2/9 07/28/2017  Decreased Interest 0  Down, Depressed, Hopeless 0  PHQ - 2 Score 0  Altered sleeping 2  Tired, decreased energy 2  Change in appetite 0  Feeling bad or failure about yourself  0  Trouble concentrating 1  Moving slowly or fidgety/restless 0  Suicidal thoughts 0  PHQ-9 Score 5  Difficult doing work/chores -    Diabetes Mellitus Type 2 She has not been compliant with taking her janumet.  She states that she takes her medication to work and sometimes forgets that her medications are in her purse.  She is not checking her blood sugars regularly and she is not current on her eye exam.  She denies any hypo-or hyper glycemia symptoms. Lab Results  Component Value Date   HGBA1C 6.4 04/26/2017    Hyperlipidemia Chronic.  Patient endorses medication compliance taking pravastatin 20 mg daily.  She denies any statin intolerance or myalgias. Lab Results  Component  Value Date   LDLCALC 88 09/09/2016    Essential Hypertension Chronic. Blood pressure is well controlled.  She endorses medication compliance taking lisinopril 10-12.5 mg daily.  She currently denies any chest pain, shortness of breath, visual disturbances, lightheadedness or dizziness, headaches, or bilateral lower extremity edema. She continues to smoke. She is not ready to quit.  BP Readings from Last 3 Encounters:  07/28/17 133/79  04/26/17 138/89  02/02/17 (!) 141/97  Review of Systems  Constitutional:  Negative for fever, malaise/fatigue and weight loss.  HENT: Negative.  Negative for nosebleeds.   Eyes: Negative.  Negative for blurred vision, double vision and photophobia.  Respiratory: Negative.  Negative for cough and shortness of breath.   Cardiovascular: Negative.  Negative for chest pain, palpitations and leg swelling.  Gastrointestinal: Negative.  Negative for abdominal pain, constipation, diarrhea, heartburn, nausea and vomiting.  Musculoskeletal: Negative.  Negative for myalgias.  Neurological: Negative.  Negative for dizziness, focal weakness, seizures and headaches.  Endo/Heme/Allergies: Negative for environmental allergies.  Psychiatric/Behavioral: Negative for depression, hallucinations, memory loss, substance abuse and suicidal ideas. The patient is nervous/anxious. The patient does not have insomnia.     Past Medical History:  Diagnosis Date  . Diabetes mellitus without complication ([place])   . Hypertension     No past surgical history on file.  Family History  Problem Relation Age of Onset  . Cancer Father     Social History Reviewed with no changes to be made today.   Outpatient Medications Prior to Visit  Medication Sig Dispense Refill  . albuterol (PROVENTIL HFA;VENTOLIN HFA) 108 (90 Base) MCG/ACT inhaler Inhale 2 puffs into the lungs every 6 (six) hours as needed for wheezing or shortness of breath. 1 Inhaler 2  . amLODipine (NORVASC) 10 MG tablet Take 1 tablet (10 mg total) by mouth daily. Must have office visit for refills 90 tablet 3  . Blood Glucose Monitoring Suppl (TRUE METRIX METER) w/Device KIT Please use as instructed 1 kit 0  . etonogestrel-ethinyl estradiol (NUVARING) 0.12-0.015 MG/24HR vaginal ring INSERT 1 RING VAGINALLY EVERY 28 DAYS, LEAVE IN PLACE FOR 3 CONSECUTIVE WEEKS, THEN REMOVE FOR 1 WEEK 3 each 3  . glucose blood (TRUE METRIX BLOOD GLUCOSE TEST) test strip Use as instructed 100 each 12  . Lancets (ACCU-CHEK SOFT TOUCH) lancets Use as  instructed 100 each 12  . lisinopril-hydrochlorothiazide (PRINZIDE,ZESTORETIC) 10-12.5 MG tablet Take 1 tablet by mouth daily. 90 tablet 3  . TRUEPLUS LANCETS 28G MISC Please use as instructed 100 each 3  . pravastatin (PRAVACHOL) 20 MG tablet Take 1 tablet (20 mg total) by mouth daily. 90 tablet 3  . cetirizine-pseudoephedrine (ZYRTEC-D) 5-120 MG tablet Take 1 tablet by mouth daily. (Patient not taking: Reported on 04/26/2017) 30 tablet 0  . fluticasone (FLONASE) 50 MCG/ACT nasal spray Place 2 sprays into both nostrils daily. (Patient not taking: Reported on 04/26/2017) 1 g 0  . ibuprofen (ADVIL,MOTRIN) 800 MG tablet Take 1 tablet (800 mg total) by mouth every 8 (eight) hours as needed for mild pain. (Patient not taking: Reported on 04/26/2017) 30 tablet 0  . pantoprazole (PROTONIX) 40 MG tablet Take 1 tablet (40 mg total) by mouth daily. (Patient not taking: Reported on 12/15/2016) 30 tablet 3  . Vitamin D, Ergocalciferol, (DRISDOL) 50000 units CAPS capsule Take 1 capsule (50,000 Units total) by mouth every 7 (seven) days. (Patient not taking: Reported on 12/15/2016) 12 capsule 0  . sitaGLIPtin-metformin (JANUMET) 50-500 MG tablet Take 1 tablet by mouth daily for  90 doses. 90 tablet 3  . cloNIDine (CATAPRES) tablet 0.2 mg      No facility-administered medications prior to visit.     No Known Allergies     Objective:    BP 133/79 (BP Location: Left Arm, Patient Position: Sitting, Cuff Size: Normal)   Pulse (!) 101   Temp 98.6 F (37 C) (Oral)   Ht _0  (1.575 m)   Wt 211 lb (95.7 kg)   LMP 05/01/2017   SpO2 100%   BMI 38.59 kg/m  Wt Readings from Last 3 Encounters:  07/28/17 211 lb (95.7 kg)  04/26/17 204 lb 9.6 oz (92.8 kg)  02/02/17 190 lb (86.2 kg)    Physical Exam  Constitutional: She is oriented to person, place, and time. She appears well-developed and well-nourished. She is cooperative.  HENT:  Head: Normocephalic and atraumatic.  Eyes: EOM are normal.  Neck: Normal  range of motion.  Cardiovascular: Normal rate, regular rhythm and normal heart sounds. Exam reveals no gallop and no friction rub.  No murmur heard. Pulmonary/Chest: Effort normal and breath sounds normal. No tachypnea. No respiratory distress. She has no decreased breath sounds. She has no wheezes. She has no rhonchi. She has no rales. She exhibits no tenderness.  Abdominal: Soft. Bowel sounds are normal.  Musculoskeletal: Normal range of motion. She exhibits no edema.  Neurological: She is alert and oriented to person, place, and time. Coordination normal.  Skin: Skin is warm and dry.  Psychiatric: Her speech is normal and behavior is normal. Judgment and thought content normal. Cognition and memory are normal. She exhibits a depressed mood.  Nursing note and vitals reviewed.      Patient has been counseled extensively about nutrition and exercise as well as the importance of adherence with medications and regular follow-up. The patient was given clear instructions to go to ER or return to medical center if symptoms don't improve, worsen or new problems develop. The patient verbalized understanding.   Follow-up: Return if symptoms worsen or fail to improve.   [name], FNP-BC [facility] and Roxana Hudson, [place]   07/28/2017, 11:18 PM

## 2017-08-13 ENCOUNTER — Emergency Department (HOSPITAL_COMMUNITY): Payer: BC Managed Care – PPO

## 2017-08-13 ENCOUNTER — Other Ambulatory Visit: Payer: Self-pay

## 2017-08-13 ENCOUNTER — Encounter (HOSPITAL_COMMUNITY): Payer: Self-pay | Admitting: Emergency Medicine

## 2017-08-13 DIAGNOSIS — I10 Essential (primary) hypertension: Secondary | ICD-10-CM | POA: Diagnosis not present

## 2017-08-13 DIAGNOSIS — Z7984 Long term (current) use of oral hypoglycemic drugs: Secondary | ICD-10-CM | POA: Diagnosis not present

## 2017-08-13 DIAGNOSIS — N3000 Acute cystitis without hematuria: Secondary | ICD-10-CM | POA: Diagnosis not present

## 2017-08-13 DIAGNOSIS — E119 Type 2 diabetes mellitus without complications: Secondary | ICD-10-CM | POA: Insufficient documentation

## 2017-08-13 DIAGNOSIS — R319 Hematuria, unspecified: Secondary | ICD-10-CM | POA: Diagnosis not present

## 2017-08-13 DIAGNOSIS — Z79899 Other long term (current) drug therapy: Secondary | ICD-10-CM | POA: Insufficient documentation

## 2017-08-13 DIAGNOSIS — F17210 Nicotine dependence, cigarettes, uncomplicated: Secondary | ICD-10-CM | POA: Insufficient documentation

## 2017-08-13 DIAGNOSIS — R0602 Shortness of breath: Secondary | ICD-10-CM | POA: Diagnosis not present

## 2017-08-13 DIAGNOSIS — R109 Unspecified abdominal pain: Secondary | ICD-10-CM | POA: Diagnosis not present

## 2017-08-13 LAB — I-STAT BETA HCG BLOOD, ED (MC, WL, AP ONLY): I-stat hCG, quantitative: <5 m[IU]/mL (ref <5)

## 2017-08-13 MED ORDER — ALBUTEROL SULFATE (2.5 MG/3ML) 0.083% IN NEBU: 5.0000 mg | INHALATION_SOLUTION | Freq: Once | RESPIRATORY_TRACT | Status: DC

## 2017-08-13 NOTE — ED Triage Notes (Signed)
Patient complaining of left flank pain that started yesterday. Patient states the pain has increased today. The pain is more toward the back on the left flank side.

## 2017-08-14 ENCOUNTER — Emergency Department (HOSPITAL_COMMUNITY)
Admission: EM | Admit: 2017-08-14 | Discharge: 2017-08-14 | Disposition: A | Discharge status: Home / Self Care | Payer: BC Managed Care – PPO | Attending: Emergency Medicine | Admitting: Emergency Medicine

## 2017-08-14 ENCOUNTER — Emergency Department (HOSPITAL_COMMUNITY): Payer: BC Managed Care – PPO

## 2017-08-14 DIAGNOSIS — N3000 Acute cystitis without hematuria: Secondary | ICD-10-CM | POA: Diagnosis not present

## 2017-08-14 LAB — CBC WITH DIFFERENTIAL/PLATELET
BASOS PCT: 0 %
Basophils Absolute: 0.1 10*3/uL (ref 0.0–0.1)
EOS ABS: 0.2 10*3/uL (ref 0.0–0.7)
Eosinophils Relative: 1 %
HCT: 38.6 % (ref 36.0–46.0)
HEMATOCRIT: 38.2 % (ref 36.0–46.0)
HEMOGLOBIN: 12.7 g/dL (ref 12.0–15.0)
HEMOGLOBIN: 12.9 g/dL (ref 12.0–15.0)
LYMPHS ABS: 3.1 10*3/uL (ref 0.7–4.0)
Lymphocytes Relative: 22 %
MCH: 28.5 pg (ref 26.0–34.0)
MCH: 28.4 pg (ref 26.0–34.0)
MCHC: 32.9 g/dL (ref 30.0–36.0)
MCHC: 33.8 g/dL (ref 30.0–36.0)
MCV: 86.5 fL (ref 78.0–100.0)
MCV: 84 fL (ref 78.0–100.0)
MONOS PCT: 6 %
Monocytes Absolute: 0.8 10*3/uL (ref 0.1–1.0)
NEUTROS ABS: 10.2 10*3/uL — AB (ref 1.7–7.7)
NEUTROS PCT: 71 %
Platelets: 303 10*3/uL (ref 150–400)
RBC: 4.46 MIL/uL (ref 3.87–5.11)
RBC: 4.55 MIL/uL (ref 3.87–5.11)
RDW: 14.4 % (ref 11.5–15.5)
RDW: 13.8 % (ref 11.5–15.5)
WBC: 14.4 10*3/uL — AB (ref 4.0–10.5)

## 2017-08-14 LAB — BASIC METABOLIC PANEL
ANION GAP: 3 — AB (ref 5–15)
BUN: 14 mg/dL (ref 6–20)
CALCIUM: 8.3 mg/dL — AB (ref 8.9–10.3)
CO2: 26 mmol/L (ref 22–32)
Chloride: 104 mmol/L (ref 101–111)
Creatinine, Ser: 1.2 mg/dL — ABNORMAL HIGH (ref 0.44–1.00)
GFR calc Af Amer: >60 mL/min (ref >60)
GFR, EST NON AFRICAN AMERICAN: 57 mL/min — AB (ref >60)
GLUCOSE: 123 mg/dL — AB (ref 65–99)
POTASSIUM: 11 mmol/L — AB (ref 3.5–5.1)
SODIUM: 133 mmol/L — AB (ref 135–145)

## 2017-08-14 LAB — I-STAT TROPONIN, ED: TROPONIN I, POC: 0 ng/mL (ref 0.00–0.08)

## 2017-08-14 LAB — URINALYSIS, ROUTINE W REFLEX MICROSCOPIC
Bilirubin Urine: NEGATIVE
Glucose, UA: 50 mg/dL — AB
KETONES UR: NEGATIVE mg/dL
Nitrite: NEGATIVE
PROTEIN: NEGATIVE mg/dL
Specific Gravity, Urine: 1.014 (ref 1.005–1.030)
pH: 5 (ref 5.0–8.0)

## 2017-08-14 LAB — I-STAT CHEM 8, ED
BUN: 11 mg/dL (ref 6–20)
CHLORIDE: 105 mmol/L (ref 101–111)
CREATININE: 0.5 mg/dL (ref 0.44–1.00)
Calcium, Ion: 1.16 mmol/L (ref 1.15–1.40)
Glucose, Bld: 125 mg/dL — ABNORMAL HIGH (ref 65–99)
HEMATOCRIT: 39 % (ref 36.0–46.0)
Hemoglobin: 13.3 g/dL (ref 12.0–15.0)
Potassium: 4 mmol/L (ref 3.5–5.1)
SODIUM: 139 mmol/L (ref 135–145)
TCO2: 23 mmol/L (ref 22–32)

## 2017-08-14 MED ORDER — CEPHALEXIN 500 MG PO CAPS
500.0000 mg | ORAL_CAPSULE | Freq: Once | ORAL | Status: AC
Start: 2017-08-14 — End: 2017-08-14
  Administered 2017-08-14: 500 mg via ORAL
  Filled 2017-08-14: qty 1

## 2017-08-14 MED ORDER — CEPHALEXIN 500 MG PO CAPS: 500.0000 mg | ORAL_CAPSULE | Freq: Three times a day (TID) | ORAL | 0 refills | Status: AC

## 2017-08-14 MED ORDER — IOPAMIDOL (ISOVUE-370) INJECTION 76%
INTRAVENOUS | Status: AC
  Administered 2017-08-14: 100 mL via INTRAVENOUS
  Filled 2017-08-14: qty 100

## 2017-08-14 MED ORDER — KETOROLAC TROMETHAMINE 30 MG/ML IJ SOLN
30.0000 mg | Freq: Once | INTRAMUSCULAR | Status: AC
  Administered 2017-08-14: 30 mg via INTRAVENOUS
  Filled 2017-08-14: qty 1

## 2017-08-14 NOTE — ED Notes (Signed)
Pt. Made aware for the need of urine specimen. 

## 2017-08-14 NOTE — ED Provider Notes (Signed)
[facility] DEPT Provider Note   CSN: [id number] Arrival date & time: 08/13/17  1943     History   Chief Complaint Chief Complaint  Patient presents with  . Shortness of Breath  . Flank Pain    HPI Samantha [name] is a 37 y.o. female past medical history of hypertension, diabetes who presents for evaluation of left flank pain that began yesterday.  She reports that pain is progressively worsened over the last 24 hours.  She states that it is worse whenever she moves, changes positions.  She does not recall any preceding trauma, injury, fall.  Patient states that she took ibuprofen for pain relief with no improvement in symptoms.  Patient states that the pain does not radiate.  She describes it as a "ache pain."  Patient reports that she did have an episode of hematuria yesterday.  She states she is not having any vaginal bleeding in the interim but only having bleeding with urination.  No dysuria noted.  Patient reports that she is having some shortness of breath associated with the pain.  When she reports that when the pain gets so bad, it makes it difficult to breathe. She states that she will have SOB when moving. No shortness of breath otherwise.  Patient denies any fevers, chills, chest pain, abdominal pain, nausea/vomiting, dysuria, hematuria, vaginal discharge, vaginal bleeding, saddle anesthesia, urinary or bowel incontinence.  The history is provided by the patient.    Past Medical History:  Diagnosis Date  . Diabetes mellitus without complication ([place])   . Hypertension     Patient Active Problem List   Diagnosis Date Noted  . Hyperlipidemia associated with type 2 diabetes mellitus ([place]) 04/26/2017  . Morbid obesity ([place]) 07/06/2016  . Type 2 diabetes mellitus with hyperglycemia ([place]) 11/12/2015  . Essential hypertension 11/04/2014    History reviewed. No pertinent surgical history.  OB History    Gravida Para Term Preterm AB Living   '9  7 7   1     ' SAB TAB Ectopic Multiple Live Births   1               Home Medications    Prior to Admission medications   Medication Sig Start Date End Date Taking? Authorizing Provider  albuterol (PROVENTIL HFA;VENTOLIN HFA) 108 (90 Base) MCG/ACT inhaler Inhale 2 puffs into the lungs every 6 (six) hours as needed for wheezing or shortness of breath. 09/09/16  Yes Langeland, Dawn T, MD  amLODipine (NORVASC) 10 MG tablet Take 1 tablet (10 mg total) by mouth daily. Must have office visit for refills 04/26/17  Yes [name], NP  Blood Glucose Monitoring Suppl (TRUE METRIX METER) w/Device KIT Please use as instructed 04/26/17  Yes [name], NP  etonogestrel-ethinyl estradiol (NUVARING) 0.12-0.015 MG/24HR vaginal ring INSERT 1 RING VAGINALLY EVERY 28 DAYS, LEAVE IN PLACE FOR 3 CONSECUTIVE WEEKS, THEN REMOVE FOR 1 WEEK 04/26/17  Yes [name], NP  glucose blood (TRUE METRIX BLOOD GLUCOSE TEST) test strip Use as instructed 04/26/17  Yes [name], NP  Lancets (ACCU-CHEK SOFT TOUCH) lancets Use as instructed 11/12/15  Yes Langeland, Dawn T, MD  lisinopril-hydrochlorothiazide (PRINZIDE,ZESTORETIC) 10-12.5 MG tablet Take 1 tablet by mouth daily. 04/26/17  Yes [name], NP  pravastatin (PRAVACHOL) 20 MG tablet Take 1 tablet (20 mg total) by mouth daily. 07/28/17  Yes [name], NP  sitaGLIPtin-metformin (JANUMET) 50-500 MG tablet Take 1 tablet by mouth daily for 90  doses. 07/28/17 10/26/17 Yes [name], NP  TRUEPLUS LANCETS 28G MISC Please use as instructed 04/26/17  Yes [name], NP  busPIRone (BUSPAR) 5 MG tablet Take 2 tablets (10 mg total) by mouth 3 (three) times daily as needed. 07/28/17   [name], NP  cetirizine-pseudoephedrine (ZYRTEC-D) 5-120 MG tablet Take 1 tablet by mouth daily. Patient not taking: Reported on 04/26/2017 02/02/17   Ok Edwards, PA-C  fluticasone [facility]) 50 MCG/ACT nasal spray Place 2 sprays into both nostrils  daily. Patient not taking: Reported on 04/26/2017 02/02/17   Ok Edwards, PA-C  pantoprazole (PROTONIX) 40 MG tablet Take 1 tablet (40 mg total) by mouth daily. Patient not taking: Reported on 12/15/2016 09/22/16   Maren Reamer, MD  Vitamin D, Ergocalciferol, (DRISDOL) 50000 units CAPS capsule Take 1 capsule (50,000 Units total) by mouth every 7 (seven) days. Patient not taking: Reported on 12/15/2016 09/11/16   Lottie Mussel T, MD  methyldopa (ALDOMET) 250 MG tablet Take 250 mg by mouth 2 (two) times daily.  11/04/14  [provider]    Family History Family History  Problem Relation Age of Onset  . Cancer Father     Social History Social History   Tobacco Use  . Smoking status: Current Every Day Smoker    Packs/day: 0.50    Years: 0.40    Pack years: 0.20    Types: Cigarettes  . Smokeless tobacco: Never Used  Substance Use Topics  . Alcohol use: No  . Drug use: No     Allergies   Patient has no known allergies.   Review of Systems Review of Systems  Constitutional: Negative for fever.  Respiratory: Negative for cough and shortness of breath.   Cardiovascular: Negative for chest pain.  Gastrointestinal: Negative for abdominal pain, nausea and vomiting.  Genitourinary: Positive for flank pain. Negative for dysuria and hematuria.  Neurological: Negative for headaches.     Physical Exam Updated Vital Signs BP (!) 125/91 (BP Location: Left Arm)   Pulse 85   Temp 98.2 F (36.8 C) (Oral)   Resp 18   Ht '5\' 2"'  (1.575 m)   Wt 95.3 kg (210 lb)   LMP 06/15/2017   SpO2 100%   BMI 38.41 kg/m   Physical Exam  Constitutional: She is oriented to person, place, and time. She appears well-developed and well-nourished.  Appears uncomfortable but no acute distress   HENT:  Head: Normocephalic and atraumatic.  Mouth/Throat: Oropharynx is clear and moist and mucous membranes are normal.  Eyes: Conjunctivae, EOM and lids are normal. Pupils are equal, round, and  reactive to light.  Neck: Full passive range of motion without pain.  Cardiovascular: Normal rate, regular rhythm, normal heart sounds and normal pulses. Exam reveals no gallop and no friction rub.  No murmur heard. Pulmonary/Chest: Effort normal and breath sounds normal.  No evidence of respiratory distress. Able to speak in full sentences without difficulty.  Abdominal: Soft. Normal appearance. There is no tenderness. There is no rigidity and no guarding.  Abdomen is soft, nondistended, nontender.  Musculoskeletal: Normal range of motion.       Thoracic back: She exhibits no tenderness.       Lumbar back: She exhibits no tenderness.  Diffuse muscular tenderness limited to the paraspinal muscles of the lumbar region.  Midline bony tenderness to the T or L-spine.  No deformity or crepitus noted.  Neurological: She is alert and oriented to person, place, and time.  Skin: Skin is warm and dry. Capillary refill takes less than 2 seconds.  Psychiatric: She has a normal mood and affect. Her speech is normal.  Nursing note and vitals reviewed.    ED Treatments / Results  Labs (all labs ordered are listed, but only abnormal results are displayed) Labs Reviewed  URINALYSIS, ROUTINE W REFLEX MICROSCOPIC  BASIC METABOLIC PANEL  CBC WITH DIFFERENTIAL/PLATELET  I-STAT BETA HCG BLOOD, ED ([facility], [facility], [facility] ONLY)    EKG  EKG Interpretation  Date/Time:  Friday August 13 2017 21:54:23 EDT Ventricular Rate:  92 PR Interval:    QRS Duration: 100 QT Interval:  358 QTC Calculation: 443 R Axis:   18 Text Interpretation:  Sinus rhythm Confirmed by Dory Horn) on 08/14/2017 6:06:05 AM       Radiology Dg Chest 2 View  Result Date: 08/13/2017 CLINICAL DATA:  Acute onset of shortness of breath and sharp left lower posterior abdominal pain. EXAM: CHEST - 2 VIEW COMPARISON:  Chest radiograph and CTA of the chest performed 09/18/2016 FINDINGS: The lungs are well-aerated and clear. There is no  evidence of focal opacification, pleural effusion or pneumothorax. The heart is normal in size; the mediastinal contour is within normal limits. No acute osseous abnormalities are seen. IMPRESSION: No acute cardiopulmonary process seen. Electronically Signed   By: Garald Balding M.D.   On: 08/13/2017 21:59    Procedures Procedures (including critical care time)  Medications Ordered in ED Medications  ketorolac (TORADOL) 30 MG/ML injection 30 mg (not administered)     Initial Impression / Assessment and Plan / ED Course  I have reviewed the triage vital signs and the nursing notes.  Pertinent labs & imaging results that were available during my care of the patient were reviewed by me and considered in my medical decision making (see chart for details).     37 year old female who presents for evaluation of left flank pain that began yesterday.  Worse with movement.  No preceding trauma, injury, fall.  No known fevers, nausea, vomiting.  Did report hematuria yesterday.  No dysuria. Patient is afebrile, non-toxic appearing, sitting comfortably on examination table. Vital signs reviewed and stable.  On exam, patient has diffuse muscular tenderness palpation overlying the lumbar paraspinal muscles of the left.  No midline bony tenderness.  Consider kidney stone versus UTI versus pyelonephritis.  History/physical exam is not concerning for appendicitis, diverticulitis, ovarian etiology.  Initial urine ordered at triage.  Will add additional lab work.  Chest x-ray negative for any abnormality.  Troponin is negative.  Initial BMP had hemolyzed and showed inaccurate readings.  Patient's BMP was repeated.  Her potassium was normal at that time.  UA showed hemoglobin, leukocytes, pyuria concerning for UTI.  I-STAT beta negative.   Reevaluation after analgesics.  Patient reports some improvement in pain.  She still feels like she is having some shortness of breath.  Vital signs are stable.  O2 sat have  remained greater than 95% on room air without any difficulty.   Patient's blood hemolyzed.  Given that patient has repeated hemolysis of labs, she is still complaining of shortness of breath with pain, will plan for CTA for evaluation of possible PE.  Patient signed out to Janetta Hora, PA-C with CTA chest pending. Anticipate discharge. Please see note from oncoming provider for full H&P.   Final Clinical Impressions(s) / ED Diagnoses   Final diagnoses:  Acute cystitis without hematuria    ED Discharge Orders    None  Volanda Napoleon, PA-C 08/14/17 Allison Quarry, April, MD 08/16/17 [id number]

## 2017-08-14 NOTE — ED Provider Notes (Signed)
Pt signed out to me by previous provider. She is a 37 year old female who presents with L flank pain and SOB. Thought to be MSK in nature vs kidney stone vs pyelonephritis. Her vitals have been overall normal. CBC is remarkable for leukocytosis. Initial BMP showed extreme hyperkalemia but this was due to blood clotting. Redraw of I-stat Chem 8 was normal. Previous provider wanted to r/o PE. CTA chest ordered. If normal, will d/c with antibiotics for possible UTI.  1:30 PM CTA is negative for PE or any other acute process. Will d/c.    Recardo Evangelist, PA-C 08/14/17 1331    Mabe, Forbes Cellar, MD 08/14/17 1344

## 2017-08-14 NOTE — Discharge Instructions (Signed)
Take antibiotics as directed. Please take all of your antibiotics until finished.  You can take Tylenol as needed for pain.   Follow-up with your primary care doctor in the next 24 to 48 hours for further evaluation.  Return the emergency department for any fever, chest pain, difficulty breathing, abdominal pain, difficulty urinating or any other worsening concerning symptoms.

## 2017-09-22 ENCOUNTER — Ambulatory Visit: Payer: BC Managed Care – PPO | Admitting: Nurse Practitioner

## 2018-02-18 ENCOUNTER — Other Ambulatory Visit: Payer: Self-pay

## 2018-02-18 MED ORDER — ALBUTEROL SULFATE HFA 108 (90 BASE) MCG/ACT IN AERS: 2.0000 | INHALATION_SPRAY | Freq: Four times a day (QID) | RESPIRATORY_TRACT | 2 refills | Status: DC | PRN

## 2018-02-18 NOTE — Telephone Encounter (Signed)
Pt transferred from pharmacy after reporting she needed her rescue inhaler ASAP because she was having trouble breathing. I spoke with the patient she did not sound in distress, she was able to speak clearly; no heavy breathing or gasping for air was heard. I asked her how she was and she said she couldn't breathe, when further questioned she identified her issue as more of a problem with wheezing rather than grasping for air. I told her I would be refilling her inhaler for her and she was instructed to go to the ER or urgent care if she got worse or if she saw no improvements with the inhaler once she picks it up from the pharmacy. She was also instructed to make an appt here if she continues to need the rescue inhaler on a regular basis. Pt agreed and will be going to pharmacy shortly to pick up her inhaler.

## 2018-02-25 ENCOUNTER — Other Ambulatory Visit: Payer: Self-pay | Admitting: Nurse Practitioner

## 2018-05-20 ENCOUNTER — Other Ambulatory Visit: Payer: Self-pay | Admitting: Nurse Practitioner

## 2018-05-27 ENCOUNTER — Other Ambulatory Visit: Payer: Self-pay | Admitting: Nurse Practitioner

## 2018-05-27 DIAGNOSIS — I10 Essential (primary) hypertension: Secondary | ICD-10-CM

## 2018-06-15 ENCOUNTER — Ambulatory Visit: Payer: BC Managed Care – PPO | Attending: Family Medicine | Admitting: Physician Assistant

## 2018-06-15 ENCOUNTER — Telehealth: Payer: Self-pay | Admitting: *Deleted

## 2018-06-15 VITALS — BP 139/99 | HR 87 | Temp 98.4°F | Resp 16 | Wt 205.8 lb

## 2018-06-15 DIAGNOSIS — E1169 Type 2 diabetes mellitus with other specified complication: Secondary | ICD-10-CM | POA: Insufficient documentation

## 2018-06-15 DIAGNOSIS — E785 Hyperlipidemia, unspecified: Secondary | ICD-10-CM | POA: Diagnosis not present

## 2018-06-15 DIAGNOSIS — F17200 Nicotine dependence, unspecified, uncomplicated: Secondary | ICD-10-CM

## 2018-06-15 DIAGNOSIS — I10 Essential (primary) hypertension: Secondary | ICD-10-CM | POA: Diagnosis not present

## 2018-06-15 DIAGNOSIS — E1165 Type 2 diabetes mellitus with hyperglycemia: Secondary | ICD-10-CM | POA: Insufficient documentation

## 2018-06-15 DIAGNOSIS — Z7984 Long term (current) use of oral hypoglycemic drugs: Secondary | ICD-10-CM | POA: Insufficient documentation

## 2018-06-15 DIAGNOSIS — F17210 Nicotine dependence, cigarettes, uncomplicated: Secondary | ICD-10-CM | POA: Insufficient documentation

## 2018-06-15 DIAGNOSIS — Z91199 Patient's noncompliance with other medical treatment and regimen due to unspecified reason: Secondary | ICD-10-CM

## 2018-06-15 DIAGNOSIS — Z79899 Other long term (current) drug therapy: Secondary | ICD-10-CM | POA: Insufficient documentation

## 2018-06-15 DIAGNOSIS — Z9119 Patient's noncompliance with other medical treatment and regimen: Secondary | ICD-10-CM

## 2018-06-15 DIAGNOSIS — Z9114 Patient's other noncompliance with medication regimen: Secondary | ICD-10-CM | POA: Insufficient documentation

## 2018-06-15 LAB — POCT GLYCOSYLATED HEMOGLOBIN (HGB A1C): HbA1c, POC (controlled diabetic range): 8.6 % — AB (ref 0.0–7.0)

## 2018-06-15 LAB — GLUCOSE, POCT (MANUAL RESULT ENTRY): POC GLUCOSE: 197 mg/dL — AB (ref 70–99)

## 2018-06-15 MED ORDER — SITAGLIPTIN PHOS-METFORMIN HCL 50-500 MG PO TABS: 1.0000 | ORAL_TABLET | Freq: Every day | ORAL | 1 refills | Status: DC

## 2018-06-15 MED ORDER — AMLODIPINE BESYLATE 10 MG PO TABS: 10.0000 mg | ORAL_TABLET | Freq: Every day | ORAL | 1 refills | Status: DC

## 2018-06-15 MED ORDER — LISINOPRIL-HYDROCHLOROTHIAZIDE 10-12.5 MG PO TABS: 1.0000 | ORAL_TABLET | Freq: Every day | ORAL | 1 refills | Status: DC

## 2018-06-15 MED ORDER — PRAVASTATIN SODIUM 20 MG PO TABS: 20.0000 mg | ORAL_TABLET | Freq: Every day | ORAL | 1 refills | Status: DC

## 2018-06-15 NOTE — Patient Instructions (Addendum)
Check blood sugars fasting and at bedtime, record, bring to next visit.     Smoking Tobacco Information, Adult Smoking tobacco can be harmful to your health. Tobacco contains a poisonous (toxic), colorless chemical called nicotine. Nicotine is addictive. It changes the brain and can make it hard to stop smoking. Tobacco also has other toxic chemicals that can hurt your body and raise your risk of many cancers. How can smoking tobacco affect me? Smoking tobacco puts you at risk for:  Cancer. Smoking is most commonly associated with lung cancer, but can also lead to cancer in other parts of the body.  Chronic obstructive pulmonary disease (COPD). This is a long-term lung condition that makes it hard to breathe. It also gets worse over time.  High blood pressure (hypertension), heart disease, stroke, or heart attack.  Lung infections, such as pneumonia.  Cataracts. This is when the lenses in the eyes become clouded.  Digestive problems. This may include peptic ulcers, heartburn, and gastroesophageal reflux disease (GERD).  Oral health problems, such as gum disease and tooth loss.  Loss of taste and smell. Smoking can affect your appearance by causing:  Wrinkles.  Yellow or stained teeth, fingers, and fingernails. Smoking tobacco can also affect your social life, because:  It may be challenging to find places to smoke when away from home. Many workplaces, [employer], hotels, and public places are tobacco-free.  Smoking is expensive. This is due to the cost of tobacco and the long-term costs of treating health problems from smoking.  Secondhand smoke may affect those around you. Secondhand smoke can cause lung cancer, breathing problems, and heart disease. Children of smokers have a higher risk for: ? Sudden infant death syndrome (SIDS). ? Ear infections. ? Lung infections. If you currently smoke tobacco, quitting now can help you:  Lead a longer and healthier life.  Look,  smell, breathe, and feel better over time.  Save money.  Protect others from the harms of secondhand smoke. What actions can I take to prevent health problems? Quit smoking   Do not start smoking. Quit if you already do.  Make a plan to quit smoking and commit to it. Look for programs to help you and ask your health care provider for recommendations and ideas.  Set a date and write down all the reasons you want to quit.  Let your friends and family know you are quitting so they can help and support you. Consider finding friends who also want to quit. It can be easier to quit with someone else, so that you can support each other.  Talk with your health care provider about using nicotine replacement medicines to help you quit, such as gum, lozenges, patches, sprays, or pills.  Do not replace cigarette smoking with electronic cigarettes, which are commonly called e-cigarettes. The safety of e-cigarettes is not known, and some may contain harmful chemicals.  If you try to quit but return to smoking, stay positive. It is common to slip up when you first quit, so take it one day at a time.  Be prepared for cravings. When you feel the urge to smoke, chew gum or suck on hard candy. Lifestyle  Stay busy and take care of your body.  Drink enough fluid to keep your urine pale yellow.  Get plenty of exercise and eat a healthy diet. This can help prevent weight gain after quitting.  Monitor your eating habits. Quitting smoking can cause you to have a larger appetite than when you smoke.  Find ways to relax. Go out with friends or family to a movie or a restaurant where people do not smoke.  Ask your health care provider about having regular tests (screenings) to check for cancer. This may include blood tests, imaging tests, and other tests.  Find ways to manage your stress, such as meditation, yoga, or exercise. Where to find support To get support to quit smoking, consider:  Asking your  health care provider for more information and resources.  Taking classes to learn more about quitting smoking.  Looking for local organizations that offer resources about quitting smoking.  Joining a support group for people who want to quit smoking in your local community.  Calling the smokefree.gov counselor helpline: 1-800-Quit-Now 561-061-3367) Where to find more information You may find more information about quitting smoking from:  HelpGuide.org: www.helpguide.org  https://hall.com/: smokefree.gov  American Lung Association: www.lung.org Contact a health care provider if you:  Have problems breathing.  Notice that your lips, nose, or fingers turn blue.  Have chest pain.  Are coughing up blood.  Feel faint or you pass out.  Have other health changes that cause you to worry. Summary  Smoking tobacco can negatively affect your health, the health of those around you, your finances, and your social life.  Do not start smoking. Quit if you already do. If you need help quitting, ask your health care provider.  Think about joining a support group for people who want to quit smoking in your local community. There are many effective programs that will help you to quit this behavior. This information is not intended to replace advice given to you by your health care provider. Make sure you discuss any questions you have with your health care provider. Document Released: 06/02/2016 Document Revised: 07/07/2017 Document Reviewed: 06/02/2016 Elsevier Interactive Patient Education  2019 [employer].

## 2018-06-15 NOTE — Progress Notes (Signed)
Patient ID: Samantha [name], female   DOB: 12/28/1980, 38 y.o.   MRN: [id number]   Samantha [name], is a 38 y.o. female  [id number]  JGG:[id number]  DOB - 12/29/1980  Subjective:  Chief Complaint and HPI: Samantha [name] is a 38 y.o. female here bc she was feeling "funny" at work and checked her blood sugar at work today and it was close to 300.  She has been out of meds for over a month.  She admits to poor compliance with meds due to financial limitations.  Not checking blood sugars regularly.  No CP/SOB/weakness/dizziness/HA.  No cough or fever.  Feels fine now.  Still smoking but not as much.    ROS:   Constitutional:  No f/c, No night sweats, No unexplained weight loss. EENT:  No vision changes, No blurry vision, No hearing changes. No mouth, throat, or ear problems.  Respiratory: No cough, No SOB Cardiac: No CP, no palpitations GI:  No abd pain, No N/V/D. GU: No Urinary s/sx Musculoskeletal: No joint pain Neuro: No headache, no dizziness, no motor weakness.  Skin: No rash Endocrine:  No polydipsia. No polyuria.  Psych: Denies SI/HI  No problems updated.  ALLERGIES: No Known Allergies  PAST MEDICAL HISTORY: Past Medical History:  Diagnosis Date  . Diabetes mellitus without complication ([place])   . Hypertension     MEDICATIONS AT HOME: Prior to Admission medications   Medication Sig Start Date End Date Taking? Authorizing Provider  albuterol (PROVENTIL HFA;VENTOLIN HFA) 108 (90 Base) MCG/ACT inhaler Inhale 2 puffs into the lungs every 6 (six) hours as needed for wheezing or shortness of breath. Patient not taking: Reported on 06/15/2018 02/18/18   [name], NP  amLODipine (NORVASC) 10 MG tablet Take 1 tablet (10 mg total) by mouth daily. Must have office visit for refills 06/15/18   Argentina Donovan, PA-C  Blood Glucose Monitoring Suppl (TRUE METRIX METER) w/Device KIT Please use as instructed 04/26/17   [name], NP  busPIRone (BUSPAR) 5 MG tablet Take 2  tablets (10 mg total) by mouth 3 (three) times daily as needed. 07/28/17   [name], NP  etonogestrel-ethinyl estradiol (NUVARING) 0.12-0.015 MG/24HR vaginal ring INSERT ONE RING VAGINALLY AND LEAVE IN FOR 3 WEEKS, THEN REMOVE FOR 1 WEEK 05/23/18   [name], NP  glucose blood (TRUE METRIX BLOOD GLUCOSE TEST) test strip Use as instructed 04/26/17   [name], NP  Lancets (ACCU-CHEK SOFT TOUCH) lancets Use as instructed 11/12/15   Maren Reamer, MD  lisinopril-hydrochlorothiazide (PRINZIDE,ZESTORETIC) 10-12.5 MG tablet Take 1 tablet by mouth daily. 06/15/18   Argentina Donovan, PA-C  pravastatin (PRAVACHOL) 20 MG tablet Take 1 tablet (20 mg total) by mouth daily. 06/15/18   Argentina Donovan, PA-C  sitaGLIPtin-metformin (JANUMET) 50-500 MG tablet Take 1 tablet by mouth daily for 90 doses. 06/15/18 09/13/18  Argentina Donovan, PA-C  TRUEPLUS LANCETS 28G MISC Please use as instructed 04/26/17   [name], NP     Objective:  EXAM:   Vitals:   06/15/18 1046  BP: (!) 139/99  Pulse: 87  Resp: 16  Temp: 98.4 F (36.9 C)  TempSrc: Oral  SpO2: 97%  Weight: 205 lb 12.8 oz (93.4 kg)    General appearance : A&OX3. NAD. Non-toxic-appearing HEENT: Atraumatic and Normocephalic.  PERRLA. EOM intact.  Neck: supple, no JVD. No cervical lymphadenopathy. No thyromegaly Chest/Lungs:  Breathing-non-labored, Good air entry bilaterally, breath sounds normal without rales, rhonchi, or wheezing  CVS: S1 S2 regular, no murmurs, gallops, rubs  Extremities: Bilateral Lower Ext shows no edema, both legs are warm to touch with = pulse throughout Neurology:  CN II-XII grossly intact, Non focal.   Psych:  TP linear. J/I WNL. Normal speech. Appropriate eye contact and affect.  Skin:  No Rash  Data Review Lab Results  Component Value Date   HGBA1C 8.6 (A) 06/15/2018   HGBA1C 6.4 04/26/2017   HGBA1C 6.1 09/22/2016     Assessment & Plan   1. Type 2 diabetes mellitus with  hyperglycemia, without long-term current use of insulin (HCC) Uncontrolled-resume medications - Glucose (CBG) - HgB A1c - sitaGLIPtin-metformin (JANUMET) 50-500 MG tablet; Take 1 tablet by mouth daily for 90 doses.  Dispense: 90 tablet; Refill: 1 - pravastatin (PRAVACHOL) 20 MG tablet; Take 1 tablet (20 mg total) by mouth daily.  Dispense: 90 tablet; Refill: 1 - Comprehensive metabolic panel - CBC with Differential/Platelet  2. Non-compliance Stressed importance of compliance  3. Essential hypertension Uncontrolled-resume medications - lisinopril-hydrochlorothiazide (PRINZIDE,ZESTORETIC) 10-12.5 MG tablet; Take 1 tablet by mouth daily.  Dispense: 90 tablet; Refill: 1 - amLODipine (NORVASC) 10 MG tablet; Take 1 tablet (10 mg total) by mouth daily. Must have office visit for refills  Dispense: 90 tablet; Refill: 1 - Comprehensive metabolic panel - CBC with Differential/Platelet  4. Smoker Cessation discussed.  Not ready but she is cutting back/contemplative stage  5. Hyperlipidemia associated with type 2 diabetes mellitus ([place]) Resume meds.   - pravastatin (PRAVACHOL) 20 MG tablet; Take 1 tablet (20 mg total) by mouth daily.  Dispense: 90 tablet; Refill: 1 - Lipid panel   Patient have been counseled extensively about nutrition and exercise  Return in about 3 months (around 09/14/2018) for [place] for DM and htn.  The patient was given clear instructions to go to ER or return to medical center if symptoms don't improve, worsen or new problems develop. The patient verbalized understanding. The patient was told to call to get lab results if they haven't heard anything in the next week.     Freeman Caldron, PA-C [facility] [place], [place]   06/15/2018, 11:00 AM

## 2018-06-15 NOTE — Telephone Encounter (Signed)
Patient Triage Assessment Form Todays Date: 06/15/2018 Name: Samantha [name] DOB: 03/28/1981  Reason for walkin: not feeling well  When did your symptoms start? Today.  Please list symptoms: not feeling well. CBG elevated 289  Are you having pain: no   Assessment- Vital Signs:  T: 98.5  P: 96 R: SpO2: 96  BP: 164/97 CBG- 234  Plan:  Same day with walkin provider

## 2018-06-15 NOTE — Progress Notes (Signed)
Pt came into office from work this mornig stating she does not feel right or feel well. She checked her blood sugar on her meter and got a reading of 289.

## 2018-06-16 ENCOUNTER — Ambulatory Visit: Payer: BC Managed Care – PPO

## 2018-06-16 LAB — CBC WITH DIFFERENTIAL/PLATELET
BASOS: 1 %
Basophils Absolute: 0.1 10*3/uL (ref 0.0–0.2)
EOS (ABSOLUTE): 0.2 10*3/uL (ref 0.0–0.4)
Eos: 1 %
HEMATOCRIT: 40.4 % (ref 34.0–46.6)
HEMOGLOBIN: 13.5 g/dL (ref 11.1–15.9)
IMMATURE GRANS (ABS): 0.1 10*3/uL (ref 0.0–0.1)
Immature Granulocytes: 1 %
Lymphocytes Absolute: 3.2 10*3/uL — ABNORMAL HIGH (ref 0.7–3.1)
Lymphs: 21 %
MCH: 28.4 pg (ref 26.6–33.0)
MCHC: 33.4 g/dL (ref 31.5–35.7)
MCV: 85 fL (ref 79–97)
Monocytes Absolute: 0.9 10*3/uL (ref 0.1–0.9)
Monocytes: 6 %
NEUTROS ABS: 11.2 10*3/uL — AB (ref 1.4–7.0)
NEUTROS PCT: 70 %
Platelets: 357 10*3/uL (ref 150–450)
RBC: 4.75 x10E6/uL (ref 3.77–5.28)
RDW: 13.5 % (ref 11.7–15.4)
WBC: 15.7 10*3/uL — ABNORMAL HIGH (ref 3.4–10.8)

## 2018-06-16 LAB — COMPREHENSIVE METABOLIC PANEL
ALBUMIN: 3.8 g/dL (ref 3.5–5.5)
ALK PHOS: 60 IU/L (ref 39–117)
ALT: 25 IU/L (ref 0–32)
AST: 22 IU/L (ref 0–40)
Albumin/Globulin Ratio: 1.3 (ref 1.2–2.2)
BUN / CREAT RATIO: 19 (ref 9–23)
BUN: 10 mg/dL (ref 6–20)
Bilirubin Total: <0.2 mg/dL (ref 0.0–1.2)
CALCIUM: 9.3 mg/dL (ref 8.7–10.2)
CO2: 19 mmol/L — AB (ref 20–29)
CREATININE: 0.54 mg/dL — AB (ref 0.57–1.00)
Chloride: 100 mmol/L (ref 96–106)
GFR calc Af Amer: 139 mL/min/{1.73_m2} (ref >59)
GFR calc non Af Amer: 121 mL/min/{1.73_m2} (ref >59)
GLUCOSE: 170 mg/dL — AB (ref 65–99)
Globulin, Total: 2.9 g/dL (ref 1.5–4.5)
Potassium: 4.2 mmol/L (ref 3.5–5.2)
Sodium: 138 mmol/L (ref 134–144)
Total Protein: 6.7 g/dL (ref 6.0–8.5)

## 2018-06-16 LAB — LIPID PANEL
CHOLESTEROL TOTAL: 264 mg/dL — AB (ref 100–199)
Chol/HDL Ratio: 4.3 ratio (ref 0.0–4.4)
HDL: 61 mg/dL (ref >39)
TRIGLYCERIDES: 600 mg/dL — AB (ref 0–149)

## 2018-06-23 ENCOUNTER — Telehealth: Payer: Self-pay

## 2018-06-23 NOTE — Telephone Encounter (Signed)
Contacted pt to go over lab results pt is aware of results  Pt only question is what can she do to get her Triglycerides down

## 2018-06-23 NOTE — Telephone Encounter (Signed)
Decrease fat and sugar and fat in diet.  Take 3 grams of fish oil daily to lower triglycerides.  Thanks, Freeman Caldron, PA-C

## 2018-06-28 NOTE — Telephone Encounter (Signed)
Contacted pt and went over [place] reponse pt doesn't have any questions or concerns

## 2018-07-25 ENCOUNTER — Other Ambulatory Visit: Payer: Self-pay | Admitting: Nurse Practitioner

## 2018-07-25 DIAGNOSIS — E1165 Type 2 diabetes mellitus with hyperglycemia: Secondary | ICD-10-CM

## 2018-07-25 DIAGNOSIS — I10 Essential (primary) hypertension: Secondary | ICD-10-CM

## 2018-07-26 ENCOUNTER — Other Ambulatory Visit: Payer: Self-pay | Admitting: Pharmacist

## 2018-07-26 DIAGNOSIS — E1165 Type 2 diabetes mellitus with hyperglycemia: Secondary | ICD-10-CM

## 2018-07-26 MED ORDER — ONETOUCH VERIO W/DEVICE KIT
PACK | 0 refills | Status: DC
Start: 2018-07-26 — End: 2018-08-29

## 2018-07-26 MED ORDER — GLUCOSE BLOOD VI STRP: ORAL_STRIP | 2 refills | Status: DC

## 2018-07-26 MED ORDER — ONETOUCH DELICA LANCETS 33G MISC
2 refills | Status: DC
Start: 2018-07-26 — End: 2018-08-29

## 2018-07-28 ENCOUNTER — Telehealth: Payer: Self-pay | Admitting: Nurse Practitioner

## 2018-08-10 ENCOUNTER — Other Ambulatory Visit: Payer: Self-pay | Admitting: Nurse Practitioner

## 2018-08-10 ENCOUNTER — Telehealth: Payer: Self-pay | Admitting: Nurse Practitioner

## 2018-08-10 MED ORDER — ETONOGESTREL-ETHINYL ESTRADIOL 0.12-0.015 MG/24HR VA RING: VAGINAL_RING | VAGINAL | 0 refills | Status: DC

## 2018-08-10 NOTE — Telephone Encounter (Signed)
1) Medication(s) Requested etonogestrel-ethinyl estradiol (NUVARING) 0.12-0.015 MG/24HR vaginal ring [[id number]]    2) [facility]   3) Special Requests: Just one until the 30th   Approved medications will be sent to the pharmacy, we will reach out if there is an issue.  Requests made after 3pm may not be addressed until the following business day!  If a patient is unsure of the name of the medication(s) please note and ask patient to call back when they are able to provide all info, do not send to responsible party until all information is available!

## 2018-08-11 NOTE — Telephone Encounter (Signed)
Patient was informed her Rx was sent.

## 2018-08-15 ENCOUNTER — Encounter: Payer: Self-pay | Admitting: Nurse Practitioner

## 2018-08-15 NOTE — Telephone Encounter (Signed)
Patient mychart concern.

## 2018-08-16 ENCOUNTER — Encounter: Payer: Self-pay | Admitting: Nurse Practitioner

## 2018-08-17 ENCOUNTER — Encounter: Payer: Self-pay | Admitting: Nurse Practitioner

## 2018-08-19 ENCOUNTER — Encounter: Payer: Self-pay | Admitting: Nurse Practitioner

## 2018-08-19 ENCOUNTER — Telehealth: Payer: Self-pay | Admitting: Nurse Practitioner

## 2018-08-19 NOTE — Telephone Encounter (Signed)
CMA informed PCP. Will notified patient when ready.

## 2018-08-19 NOTE — Telephone Encounter (Signed)
Patient dropped off FMLA paperwork. Patient states she would like to be notified when paperwork has been faxed. Will drop off in PCP box.   Please follow up.

## 2018-08-19 NOTE — Telephone Encounter (Signed)
FMLA attachment and completion request. Please advise patient.

## 2018-08-22 ENCOUNTER — Encounter: Payer: Self-pay | Admitting: Nurse Practitioner

## 2018-08-22 NOTE — Telephone Encounter (Signed)
I left a message for human resources at [facility] to call me back still waiting to hear from them.

## 2018-08-29 ENCOUNTER — Other Ambulatory Visit: Payer: Self-pay

## 2018-08-29 ENCOUNTER — Other Ambulatory Visit: Payer: Self-pay | Admitting: Pharmacist

## 2018-08-29 ENCOUNTER — Ambulatory Visit: Payer: BC Managed Care – PPO | Attending: Nurse Practitioner | Admitting: Nurse Practitioner

## 2018-08-29 ENCOUNTER — Encounter: Payer: Self-pay | Admitting: Nurse Practitioner

## 2018-08-29 DIAGNOSIS — Z79899 Other long term (current) drug therapy: Secondary | ICD-10-CM | POA: Insufficient documentation

## 2018-08-29 DIAGNOSIS — E1165 Type 2 diabetes mellitus with hyperglycemia: Secondary | ICD-10-CM | POA: Diagnosis not present

## 2018-08-29 DIAGNOSIS — Z7901 Long term (current) use of anticoagulants: Secondary | ICD-10-CM | POA: Insufficient documentation

## 2018-08-29 DIAGNOSIS — F419 Anxiety disorder, unspecified: Secondary | ICD-10-CM | POA: Diagnosis not present

## 2018-08-29 DIAGNOSIS — I10 Essential (primary) hypertension: Secondary | ICD-10-CM | POA: Diagnosis not present

## 2018-08-29 DIAGNOSIS — Z7984 Long term (current) use of oral hypoglycemic drugs: Secondary | ICD-10-CM | POA: Insufficient documentation

## 2018-08-29 MED ORDER — MISC. DEVICES MISC: 0 refills | Status: DC

## 2018-08-29 MED ORDER — ONETOUCH VERIO W/DEVICE KIT: PACK | 0 refills | Status: DC

## 2018-08-29 MED ORDER — ONETOUCH DELICA LANCETS 33G MISC: 2 refills | Status: DC

## 2018-08-29 MED ORDER — SITAGLIPTIN PHOS-METFORMIN HCL 50-1000 MG PO TABS: 1.0000 | ORAL_TABLET | Freq: Two times a day (BID) | ORAL | 3 refills | Status: DC

## 2018-08-29 MED ORDER — ACCU-CHEK FASTCLIX LANCETS MISC: 3 refills | Status: DC

## 2018-08-29 MED ORDER — GLUCOSE BLOOD VI STRP
ORAL_STRIP | 12 refills | Status: DC
Start: 2018-08-29 — End: 2018-08-29

## 2018-08-29 MED ORDER — ACCU-CHEK AVIVA DEVI: 0 refills | Status: DC

## 2018-08-29 MED ORDER — BUSPIRONE HCL 5 MG PO TABS: 10.0000 mg | ORAL_TABLET | Freq: Three times a day (TID) | ORAL | 1 refills | Status: DC | PRN

## 2018-08-29 MED ORDER — GLUCOSE BLOOD VI STRP: ORAL_STRIP | 11 refills | Status: DC

## 2018-08-29 NOTE — Progress Notes (Signed)
Assessment & Plan:  Samantha [name] was seen today for follow-up.  Diagnoses and all orders for this visit:  Type 2 diabetes mellitus with hyperglycemia, without long-term current use of insulin (HCC) -     Blood Glucose Monitoring Suppl (ACCU-CHEK AVIVA) device; Use as instructed. Monitor blood glucose levels twice per day. -     glucose blood (ACCU-CHEK AVIVA) test strip; Use as instructed. Monitor blood glucose levels twice per day. -     Accu-Chek FastClix Lancets MISC; Use as instructed. Inject into the skin twice daily -     sitaGLIPtin-metformin (JANUMET) 50-1000 MG tablet; Take 1 tablet by mouth 2 (two) times daily with a meal for 30 days. -     Ambulatory referral to Ophthalmology Continue blood sugar control as discussed in office today, low carbohydrate diet, and regular physical exercise as tolerated, 150 minutes per week (30 min each day, 5 days per week, or 50 min 3 days per week). Keep blood sugar logs with fasting goal of 90-130 mg/dl, post prandial (after you eat) less than 180.  For Hypoglycemia: BS <60 and Hyperglycemia BS >400; contact the clinic ASAP. Annual eye exams and foot exams are recommended.   Essential hypertension -     Misc. Devices MISC; Please provide patient with insurance approved blood pressure monitor Continue all antihypertensives as prescribed.  Remember to bring in your blood pressure log with you for your follow up appointment.  DASH/Mediterranean Diets are healthier choices for HTN.    Anxiety -     busPIRone (BUSPAR) 5 MG tablet; Take 2 tablets (10 mg total) by mouth 3 (three) times daily as needed.    Patient has been counseled on age-appropriate routine health concerns for screening and prevention. These are reviewed and up-to-date. Referrals have been placed accordingly. Immunizations are up-to-date or declined.    Subjective:   Chief Complaint  Patient presents with  . Follow-up    Pt. stated the last time she checked her Blood sugar was  in February. Pt. stated she ran out of test strip.    HPI   Type 2 DM Chronic and poorly controlled. She ran out of strips over a  Month ago but does report "high" readings fasting and postprandial. Fasting 160-200s when she was monitoring her blood glucose levels twice per day in January and February. Will increase Janumet to 50-100 mg BID. She denies any hypo or hyperglycemic symptoms. Overdue for eye exam. Will place referral.  Lab Results  Component Value Date   HGBA1C 8.6 (A) 06/15/2018      ESSENTIAL HYPERTENSION She does not monitor her blood pressure at home as she does not have a monitor. She endorses medication compliance taking amlodipine 68m daiy and lisinopril-hctz 10-12.5 mg daily. Denies chest pain, shortness of breath, palpitations, lightheadedness, dizziness, headaches or BLE edema.  BP Readings from Last 3 Encounters:  06/15/18 (!) 139/99  08/14/17 (!) 134/94  07/28/17 133/79   Anxiety  Chronic and well controlled with buspar prn. She only takes sparingly but reports effective relief of symptoms.  Review of Systems  Constitutional: Negative for fever, malaise/fatigue and weight loss.  HENT: Negative.  Negative for nosebleeds.   Eyes: Negative.  Negative for blurred vision, double vision and photophobia.  Respiratory: Negative.  Negative for cough and shortness of breath.   Cardiovascular: Negative.  Negative for chest pain, palpitations and leg swelling.  Gastrointestinal: Negative.  Negative for heartburn, nausea and vomiting.  Musculoskeletal: Negative.  Negative for myalgias.  Neurological: Negative.  Negative for dizziness, focal weakness, seizures and headaches.  Psychiatric/Behavioral: Negative for suicidal ideas. The patient is nervous/anxious.     Past Medical History:  Diagnosis Date  . Diabetes mellitus without complication ([place])   . Hypertension     History reviewed. No pertinent surgical history.  Family History  Problem Relation Age of Onset   . Cancer Father     Social History Reviewed with no changes to be made today.   Outpatient Medications Prior to Visit  Medication Sig Dispense Refill  . amLODipine (NORVASC) 10 MG tablet Take 1 tablet (10 mg total) by mouth daily. Must have office visit for refills 90 tablet 1  . etonogestrel-ethinyl estradiol (NUVARING) 0.12-0.015 MG/24HR vaginal ring INSERT ONE RING VAGINALLY AND LEAVE IN FOR 3 WEEKS, THEN REMOVE FOR 1 WEEK 1 each 0  . lisinopril-hydrochlorothiazide (PRINZIDE,ZESTORETIC) 10-12.5 MG tablet Take 1 tablet by mouth daily. 90 tablet 1  . ONETOUCH DELICA LANCETS 13Y MISC Use to check blood sugar up to 3 times daily. 100 each 2  . pravastatin (PRAVACHOL) 20 MG tablet Take 1 tablet (20 mg total) by mouth daily. 90 tablet 1  . Blood Glucose Monitoring Suppl (ONETOUCH VERIO) w/Device KIT Use to check blood sugar 3 times daily. 1 kit 0  . glucose blood (ONETOUCH VERIO) test strip Use as instructed to check blood sugar up to 3 times daily. 100 each 2  . sitaGLIPtin-metformin (JANUMET) 50-500 MG tablet Take 1 tablet by mouth daily for 90 doses. 90 tablet 1  . albuterol (PROVENTIL HFA;VENTOLIN HFA) 108 (90 Base) MCG/ACT inhaler Inhale 2 puffs into the lungs every 6 (six) hours as needed for wheezing or shortness of breath. (Patient not taking: Reported on 06/15/2018) 1 Inhaler 2  . busPIRone (BUSPAR) 5 MG tablet Take 2 tablets (10 mg total) by mouth 3 (three) times daily as needed. (Patient not taking: Reported on 08/29/2018) 60 tablet 1   No facility-administered medications prior to visit.     No Known Allergies     Objective:    There were no vitals taken for this visit. Wt Readings from Last 3 Encounters:  06/15/18 205 lb 12.8 oz (93.4 kg)  08/13/17 210 lb (95.3 kg)  07/28/17 211 lb (95.7 kg)        Patient has been counseled extensively about nutrition and exercise as well as the importance of adherence with medications and regular follow-up. The patient was given clear  instructions to go to ER or return to medical center if symptoms don't improve, worsen or new problems develop. The patient verbalized understanding.   Follow-up: Return in about 3 months (around 11/29/2018).   [name], FNP-BC [facility], [place]   08/29/2018, 2:04 PM

## 2018-09-06 ENCOUNTER — Other Ambulatory Visit: Payer: Self-pay | Admitting: Nurse Practitioner

## 2018-09-08 ENCOUNTER — Other Ambulatory Visit: Payer: Self-pay

## 2018-09-08 DIAGNOSIS — E1165 Type 2 diabetes mellitus with hyperglycemia: Secondary | ICD-10-CM

## 2018-09-08 MED ORDER — SITAGLIPTIN PHOS-METFORMIN HCL 50-1000 MG PO TABS: 1.0000 | ORAL_TABLET | Freq: Two times a day (BID) | ORAL | 3 refills | Status: DC

## 2018-09-08 MED ORDER — ALBUTEROL SULFATE HFA 108 (90 BASE) MCG/ACT IN AERS: 2.0000 | INHALATION_SPRAY | Freq: Four times a day (QID) | RESPIRATORY_TRACT | 2 refills | Status: DC | PRN

## 2018-09-12 ENCOUNTER — Other Ambulatory Visit: Payer: Self-pay | Admitting: Pharmacist

## 2018-09-12 MED ORDER — ALBUTEROL SULFATE HFA 108 (90 BASE) MCG/ACT IN AERS: 2.0000 | INHALATION_SPRAY | Freq: Four times a day (QID) | RESPIRATORY_TRACT | 2 refills | Status: DC | PRN

## 2018-11-25 ENCOUNTER — Other Ambulatory Visit: Payer: Self-pay | Admitting: Nurse Practitioner

## 2018-12-06 ENCOUNTER — Other Ambulatory Visit: Payer: Self-pay | Admitting: Physician Assistant

## 2018-12-06 DIAGNOSIS — E1169 Type 2 diabetes mellitus with other specified complication: Secondary | ICD-10-CM

## 2018-12-06 DIAGNOSIS — E785 Hyperlipidemia, unspecified: Secondary | ICD-10-CM

## 2018-12-06 DIAGNOSIS — I10 Essential (primary) hypertension: Secondary | ICD-10-CM

## 2018-12-06 DIAGNOSIS — E1165 Type 2 diabetes mellitus with hyperglycemia: Secondary | ICD-10-CM

## 2018-12-19 ENCOUNTER — Encounter: Payer: Self-pay | Admitting: Nurse Practitioner

## 2018-12-19 ENCOUNTER — Other Ambulatory Visit: Payer: Self-pay

## 2018-12-19 ENCOUNTER — Ambulatory Visit: Payer: BC Managed Care – PPO | Attending: Nurse Practitioner | Admitting: Nurse Practitioner

## 2018-12-19 VITALS — BP 115/75 | HR 105 | Temp 98.9°F | Ht 62.0 in | Wt 198.0 lb

## 2018-12-19 DIAGNOSIS — E785 Hyperlipidemia, unspecified: Secondary | ICD-10-CM | POA: Diagnosis not present

## 2018-12-19 DIAGNOSIS — E1169 Type 2 diabetes mellitus with other specified complication: Secondary | ICD-10-CM | POA: Diagnosis not present

## 2018-12-19 DIAGNOSIS — Z7984 Long term (current) use of oral hypoglycemic drugs: Secondary | ICD-10-CM | POA: Diagnosis not present

## 2018-12-19 DIAGNOSIS — Z809 Family history of malignant neoplasm, unspecified: Secondary | ICD-10-CM | POA: Diagnosis not present

## 2018-12-19 DIAGNOSIS — E1165 Type 2 diabetes mellitus with hyperglycemia: Secondary | ICD-10-CM

## 2018-12-19 DIAGNOSIS — D72829 Elevated white blood cell count, unspecified: Secondary | ICD-10-CM

## 2018-12-19 DIAGNOSIS — Z79899 Other long term (current) drug therapy: Secondary | ICD-10-CM | POA: Diagnosis not present

## 2018-12-19 DIAGNOSIS — Z793 Long term (current) use of hormonal contraceptives: Secondary | ICD-10-CM | POA: Insufficient documentation

## 2018-12-19 DIAGNOSIS — F17210 Nicotine dependence, cigarettes, uncomplicated: Secondary | ICD-10-CM | POA: Diagnosis not present

## 2018-12-19 DIAGNOSIS — F17200 Nicotine dependence, unspecified, uncomplicated: Secondary | ICD-10-CM

## 2018-12-19 DIAGNOSIS — I10 Essential (primary) hypertension: Secondary | ICD-10-CM | POA: Diagnosis not present

## 2018-12-19 DIAGNOSIS — F419 Anxiety disorder, unspecified: Secondary | ICD-10-CM

## 2018-12-19 DIAGNOSIS — E781 Pure hyperglyceridemia: Secondary | ICD-10-CM

## 2018-12-19 DIAGNOSIS — Z760 Encounter for issue of repeat prescription: Secondary | ICD-10-CM | POA: Insufficient documentation

## 2018-12-19 LAB — POCT GLYCOSYLATED HEMOGLOBIN (HGB A1C): Hemoglobin A1C: 6.8 % — AB (ref 4.0–5.6)

## 2018-12-19 LAB — GLUCOSE, POCT (MANUAL RESULT ENTRY): POC Glucose: 258 mg/dl — AB (ref 70–99)

## 2018-12-19 MED ORDER — ALBUTEROL SULFATE HFA 108 (90 BASE) MCG/ACT IN AERS: 2.0000 | INHALATION_SPRAY | Freq: Four times a day (QID) | RESPIRATORY_TRACT | 2 refills | Status: DC | PRN

## 2018-12-19 MED ORDER — JANUMET 50-1000 MG PO TABS: 1.0000 | ORAL_TABLET | Freq: Two times a day (BID) | ORAL | 2 refills | Status: DC

## 2018-12-19 MED ORDER — LISINOPRIL-HYDROCHLOROTHIAZIDE 10-12.5 MG PO TABS: 1.0000 | ORAL_TABLET | Freq: Every day | ORAL | 1 refills | Status: DC

## 2018-12-19 MED ORDER — PRAVASTATIN SODIUM 20 MG PO TABS: 20.0000 mg | ORAL_TABLET | Freq: Every day | ORAL | 1 refills | Status: DC

## 2018-12-19 MED ORDER — AMLODIPINE BESYLATE 10 MG PO TABS: 10.0000 mg | ORAL_TABLET | Freq: Every day | ORAL | 1 refills | Status: DC

## 2018-12-19 NOTE — Progress Notes (Signed)
Assessment & Plan:  Samantha [name] was seen today for medication refill.  Diagnoses and all orders for this visit:  Type 2 diabetes mellitus with hyperglycemia, without long-term current use of insulin (HCC) -     Glucose (CBG) -     HgB A1c -     sitaGLIPtin-metformin (JANUMET) 50-1000 MG tablet; Take 1 tablet by mouth 2 (two) times daily with a meal. -     pravastatin (PRAVACHOL) 20 MG tablet; Take 1 tablet (20 mg total) by mouth daily. Continue blood sugar control as discussed in office today, low carbohydrate diet, and regular physical exercise as tolerated, 150 minutes per week (30 min each day, 5 days per week, or 50 min 3 days per week). Keep blood sugar logs with fasting goal of 90-130 mg/dl, post prandial (after you eat) less than 180.  For Hypoglycemia: BS <60 and Hyperglycemia BS >400; contact the clinic ASAP. Annual eye exams and foot exams are recommended.   Hyperlipidemia associated with type 2 diabetes mellitus (HCC) -     pravastatin (PRAVACHOL) 20 MG tablet; Take 1 tablet (20 mg total) by mouth daily. -     Lipid panel  Anxiety Continue Buspar 10 mg TID prn  Essential hypertension -     lisinopril-hydrochlorothiazide (ZESTORETIC) 10-12.5 MG tablet; Take 1 tablet by mouth daily. -     amLODipine (NORVASC) 10 MG tablet; Take 1 tablet (10 mg total) by mouth daily. -     CMP14+EGFR -     Misc. Devices MISC; Please provide patient with insurance approved blood pressure monitor -     Blood Pressure Monitor DEVI; Please provide patient with insurance approved blood pressure monitor Continue all antihypertensives as prescribed.  Remember to bring in your blood pressure log with you for your follow up appointment.  DASH/Mediterranean Diets are healthier choices for HTN.    Tobacco dependence -     albuterol (VENTOLIN HFA) 108 (90 Base) MCG/ACT inhaler; Inhale 2 puffs into the lungs every 6 (six) hours as needed for wheezing or shortness of breath. Samantha [name] was counseled on  the dangers of tobacco use, and was advised to quit. Reviewed strategies to maximize success, including removing cigarettes and smoking materials from environment, stress management and support of family/friends as well as pharmacological alternatives including: Wellbutrin, Chantix, Nicotine patch, Nicotine gum or lozenges. Smoking cessation support: smoking cessation hotline: 1-800-QUIT-NOW.  Smoking cessation classes are also available through [facility] and [facility]. Call [phone] or visit our website at [email or web address]   A total of 3 minutes was spent on counseling for smoking cessation and Samantha [name] is not ready to quit.   Leukocytosis, unspecified type -     CBC  Hypertriglyceridemia -     omega-3 acid ethyl esters (LOVAZA) 1 g capsule; Take 2 capsules (2 g total) by mouth 2 (two) times daily. INSTRUCTIONS: Work on a low fat, heart healthy diet and participate in regular aerobic exercise program by working out at least 150 minutes per week; 5 days a week-30 minutes per day. Avoid red meat, fried foods. junk foods, sodas, sugary drinks, unhealthy snacking, alcohol and smoking.  Drink at least 48oz of water per day and monitor your carbohydrate intake daily.    Patient has been counseled on age-appropriate routine health concerns for screening and prevention. These are reviewed and up-to-date. Referrals have been placed accordingly. Immunizations are up-to-date or declined.    Subjective:   Chief Complaint  Patient presents with  . Medication Refill  Pt. is asking for medication refill.    HPI Samantha [name] 38 y.o. female presents to office today for follow up.  Has a past medical history of Diabetes mellitus without complication ([place]) and Hypertension. She continues to smoke and has increased to 1 ppd from 0.5. She is not ready to quit.   DM TYPE 2 Chronic and very well improved. Down from 8.6 to 6.8. Her weight is down as well. She denies any hypo or  hyperglycemic symptoms. Taking STATIN and ACE. Compliant with janumet 50-1000 mg BID. Has not been monitoring her blood glucose levels as instructed. Overdue for eye exam.  Lab Results  Component Value Date   HGBA1C 6.8 (A) 12/19/2018    Essential Hypertension Chronic and well controlled. Taking amlodipine 10 mg daily and lisinopril-hctz 10-12.5 mg tablets daily as prescribed. She does not have a blood pressure monitor at home. Will order one through her insurance today. Denies chest pain, shortness of breath, palpitations, lightheadedness, dizziness, headaches or BLE edema.  BP Readings from Last 3 Encounters:  12/19/18 115/75  06/15/18 (!) 139/99  08/14/17 (!) 134/94    Anxiety Endorses increased stress and anxiety in regard to her finances and current lack of employment. She denies any thoughts of self harm.  GAD 7 : Generalized Anxiety Score 12/19/2018 06/15/2018 07/28/2017 04/26/2017  Nervous, Anxious, on Edge 2 1 0 0  Control/stop worrying '2 1 1 ' 0  Worry too much - different things '2 1 2 ' 0  Trouble relaxing '2 3 3 1  ' Restless '2 1 1 ' 0  Easily annoyed or irritable '2 2 1 1  ' Afraid - awful might happen 1 0 0 0  Total GAD 7 Score '13 9 8 2  ' Anxiety Difficulty - - - -   LIPID DISORDER LDL not at goal of <70. Will add omega 3 due to hypertriglyceridemia. She is currently taking pravastatin 20 mg daily. She denies any statin intolerance or myalgias.   Lab Results  Component Value Date   CHOL 197 12/19/2018   CHOL 264 (H) 06/15/2018   CHOL 212 (H) 04/26/2017   Lab Results  Component Value Date   HDL 55 12/19/2018   HDL 61 06/15/2018   HDL 56 04/26/2017   Lab Results  Component Value Date   LDLCALC Comment 12/19/2018   [place] Comment 06/15/2018   [place] Comment 04/26/2017   Lab Results  Component Value Date   TRIG 462 (H) 12/19/2018   TRIG 600 (HH) 06/15/2018   TRIG 417 (H) 04/26/2017   Lab Results  Component Value Date   CHOLHDL 3.6 12/19/2018   CHOLHDL 4.3  06/15/2018   CHOLHDL 3.8 04/26/2017    Review of Systems  Constitutional: Negative for fever, malaise/fatigue and weight loss.  HENT: Negative.  Negative for nosebleeds.   Eyes: Negative.  Negative for blurred vision, double vision and photophobia.  Respiratory: Negative.  Negative for cough and shortness of breath.   Cardiovascular: Negative.  Negative for chest pain, palpitations and leg swelling.  Gastrointestinal: Negative.  Negative for heartburn, nausea and vomiting.  Musculoskeletal: Negative.  Negative for myalgias.  Neurological: Negative.  Negative for dizziness, focal weakness, seizures and headaches.  Psychiatric/Behavioral: Negative for suicidal ideas. The patient is nervous/anxious.     Past Medical History:  Diagnosis Date  . Diabetes mellitus without complication ([place])   . Hyperlipidemia   . Hypertension     History reviewed. No pertinent surgical history.  Family History  Problem Relation Age of Onset  . Cancer  Father     Social History Reviewed with no changes to be made today.   Outpatient Medications Prior to Visit  Medication Sig Dispense Refill  . busPIRone (BUSPAR) 5 MG tablet Take 2 tablets (10 mg total) by mouth 3 (three) times daily as needed. 60 tablet 1  . etonogestrel-ethinyl estradiol (NUVARING) 0.12-0.015 MG/24HR vaginal ring INSERT 1 RING VAGINALLY FOR 3 WEEKS THEN REMOVE FOR 1 WEEK 1 each 2  . albuterol (PROVENTIL HFA;VENTOLIN HFA) 108 (90 Base) MCG/ACT inhaler Inhale 2 puffs into the lungs every 6 (six) hours as needed for wheezing or shortness of breath. 6.7 g 2  . amLODipine (NORVASC) 10 MG tablet TAKE 1 TABLET (10 MG TOTAL) BY MOUTH DAILY. MUST HAVE OFFICE VISIT FOR REFILLS 15 tablet 0  . lisinopril-hydrochlorothiazide (PRINZIDE,ZESTORETIC) 10-12.5 MG tablet Take 1 tablet by mouth daily. 90 tablet 1  . Misc. Devices MISC Please provide patient with insurance approved blood pressure monitor 1 each 0  . pravastatin (PRAVACHOL) 20 MG tablet  TAKE 1 TABLET (20 MG TOTAL) BY MOUTH DAILY. 15 tablet 0  . sitaGLIPtin-metformin (JANUMET) 50-1000 MG tablet Take 1 tablet by mouth 2 (two) times daily with a meal for 30 days. 60 tablet 3   No facility-administered medications prior to visit.     No Known Allergies     Objective:    BP 115/75 (BP Location: Left Arm, Patient Position: Sitting, Cuff Size: Large)   Pulse (!) 105   Temp 98.9 F (37.2 C) (Oral)   Ht '5\' 2"'  (1.575 m)   Wt 198 lb (89.8 kg)   LMP  (LMP Unknown)   SpO2 97%   BMI 36.21 kg/m  Wt Readings from Last 3 Encounters:  12/19/18 198 lb (89.8 kg)  06/15/18 205 lb 12.8 oz (93.4 kg)  08/13/17 210 lb (95.3 kg)    Physical Exam Vitals signs and nursing note reviewed.  Constitutional:      Appearance: She is well-developed.  HENT:     Head: Normocephalic and atraumatic.  Neck:     Musculoskeletal: Normal range of motion.  Cardiovascular:     Rate and Rhythm: Regular rhythm. Tachycardia present.     Heart sounds: Normal heart sounds. No murmur. No friction rub. No gallop.   Pulmonary:     Effort: Pulmonary effort is normal. No tachypnea or respiratory distress.     Breath sounds: Normal breath sounds. No decreased breath sounds, wheezing, rhonchi or rales.  Chest:     Chest wall: No tenderness.  Abdominal:     General: Bowel sounds are normal.     Palpations: Abdomen is soft.  Musculoskeletal: Normal range of motion.  Skin:    General: Skin is warm and dry.  Neurological:     Mental Status: She is alert and oriented to person, place, and time.     Coordination: Coordination normal.  Psychiatric:        Behavior: Behavior normal. Behavior is cooperative.        Thought Content: Thought content normal.        Judgment: Judgment normal.          Patient has been counseled extensively about nutrition and exercise as well as the importance of adherence with medications and regular follow-up. The patient was given clear instructions to go to ER or  return to medical center if symptoms don't improve, worsen or new problems develop. The patient verbalized understanding.   Follow-up: Return for NEEDS PAP can schedule based on patient availability  and then schedule for 3 months for dm.   [name], FNP-BC [facility] [place], [place]   12/24/2018, 9:45 PM

## 2018-12-19 NOTE — Patient Instructions (Signed)

## 2018-12-20 LAB — CMP14+EGFR
ALT: 15 IU/L (ref 0–32)
AST: 14 IU/L (ref 0–40)
Albumin/Globulin Ratio: 1.4 (ref 1.2–2.2)
Albumin: 3.8 g/dL (ref 3.8–4.8)
Alkaline Phosphatase: 48 IU/L (ref 39–117)
BUN/Creatinine Ratio: 15 (ref 9–23)
BUN: 11 mg/dL (ref 6–20)
Bilirubin Total: 0.2 mg/dL (ref 0.0–1.2)
CO2: 18 mmol/L — ABNORMAL LOW (ref 20–29)
Calcium: 9.5 mg/dL (ref 8.7–10.2)
Chloride: 99 mmol/L (ref 96–106)
Creatinine, Ser: 0.71 mg/dL (ref 0.57–1.00)
GFR calc Af Amer: 125 mL/min/{1.73_m2} (ref >59)
GFR calc non Af Amer: 108 mL/min/{1.73_m2} (ref >59)
Globulin, Total: 2.8 g/dL (ref 1.5–4.5)
Glucose: 205 mg/dL — ABNORMAL HIGH (ref 65–99)
Potassium: 3.6 mmol/L (ref 3.5–5.2)
Sodium: 138 mmol/L (ref 134–144)
Total Protein: 6.6 g/dL (ref 6.0–8.5)

## 2018-12-20 LAB — CBC
Hematocrit: 44.1 % (ref 34.0–46.6)
Hemoglobin: 14 g/dL (ref 11.1–15.9)
MCH: 28.5 pg (ref 26.6–33.0)
MCHC: 31.7 g/dL (ref 31.5–35.7)
MCV: 90 fL (ref 79–97)
Platelets: 389 10*3/uL (ref 150–450)
RBC: 4.91 x10E6/uL (ref 3.77–5.28)
RDW: 13.5 % (ref 11.7–15.4)
WBC: 14.7 10*3/uL — ABNORMAL HIGH (ref 3.4–10.8)

## 2018-12-20 LAB — LIPID PANEL
Chol/HDL Ratio: 3.6 ratio (ref 0.0–4.4)
Cholesterol, Total: 197 mg/dL (ref 100–199)
HDL: 55 mg/dL (ref >39)
Triglycerides: 462 mg/dL — ABNORMAL HIGH (ref 0–149)

## 2018-12-24 ENCOUNTER — Encounter: Payer: Self-pay | Admitting: Nurse Practitioner

## 2018-12-24 MED ORDER — BLOOD PRESSURE MONITOR DEVI: 0 refills | Status: AC

## 2018-12-24 MED ORDER — MISC. DEVICES MISC: 0 refills | Status: AC

## 2018-12-24 MED ORDER — OMEGA-3-ACID ETHYL ESTERS 1 G PO CAPS: 2.0000 g | ORAL_CAPSULE | Freq: Two times a day (BID) | ORAL | 1 refills | Status: DC

## 2018-12-27 ENCOUNTER — Other Ambulatory Visit: Payer: Self-pay

## 2018-12-28 ENCOUNTER — Encounter: Payer: Self-pay | Admitting: Nurse Practitioner

## 2019-01-09 DIAGNOSIS — H5213 Myopia, bilateral: Secondary | ICD-10-CM | POA: Diagnosis not present

## 2019-01-19 DIAGNOSIS — H52222 Regular astigmatism, left eye: Secondary | ICD-10-CM | POA: Diagnosis not present

## 2019-01-27 LAB — HM DIABETES EYE EXAM

## 2019-02-05 IMAGING — CT CT ANGIO CHEST
2 of 6 series · 19 of 36 positions shown · IV contrast (ISOVUE)
Comparison: 09/18/2016

CLINICAL DATA: LEFT flank pain.  Short of breath.

EXAM:
CT ANGIOGRAPHY CHEST WITH CONTRAST
TECHNIQUE: Multidetector CT imaging of the chest was performed using the
standard protocol during bolus administration of intravenous
contrast. Multiplanar CT image reconstructions and MIPs were
obtained to evaluate the vascular anatomy.
CONTRAST:  100 mL PQKLK9-RAL IOPAMIDOL (PQKLK9-RAL) INJECTION 76%

[Series 5: thins · axial · 0.74mm/px · z∈[+1423,+1658]mm · 18 of 263 slices shown]
[im 14/263  lung]
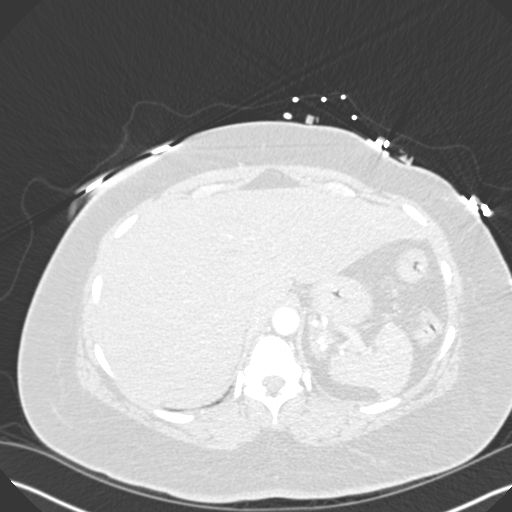
[im 27/263  mediastinal]
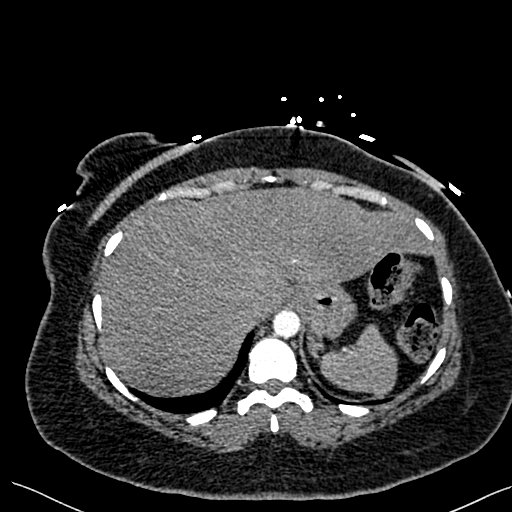
[im 40/263  lung]
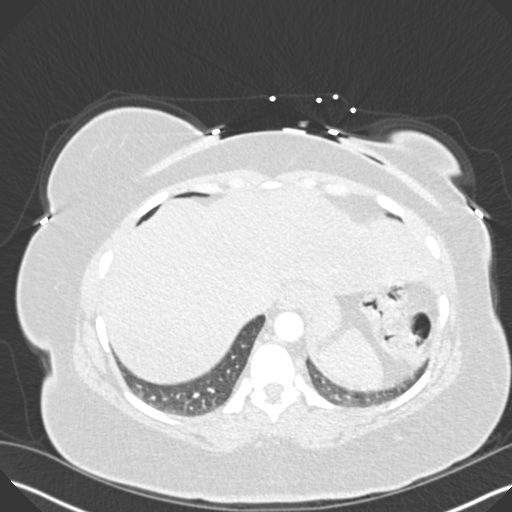
[im 53/263  mediastinal]
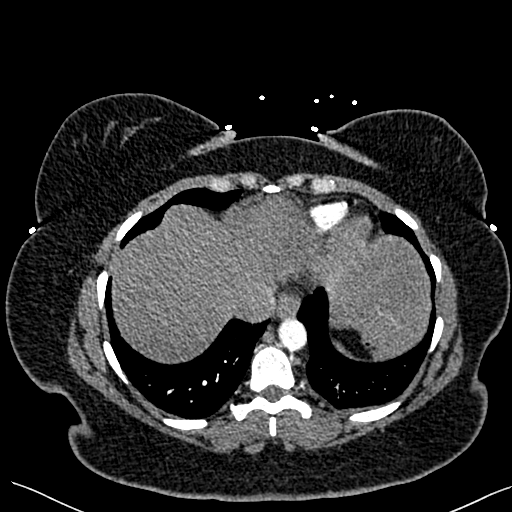
[im 66/263  lung]
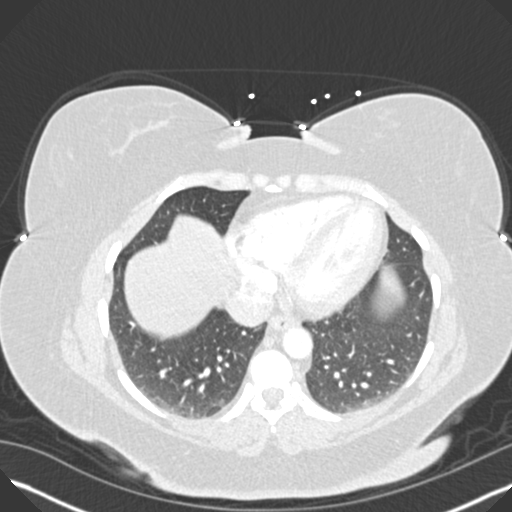
[im 79/263  mediastinal]
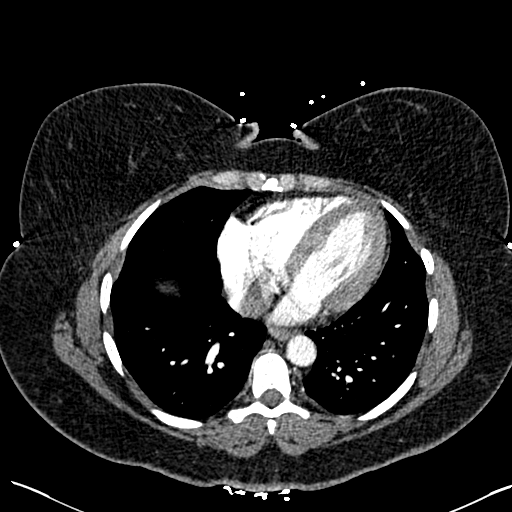
[im 92/263  lung]
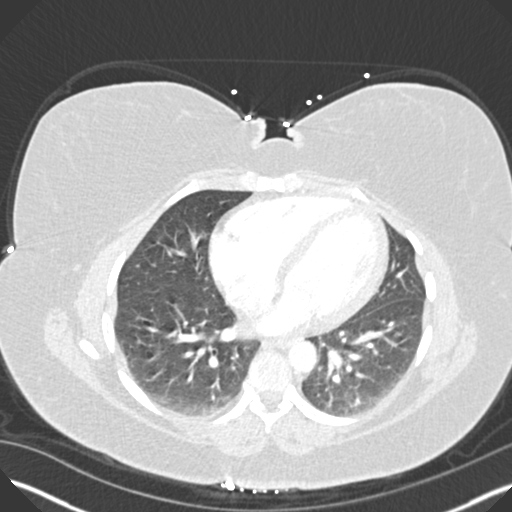
[im 105/263  mediastinal]
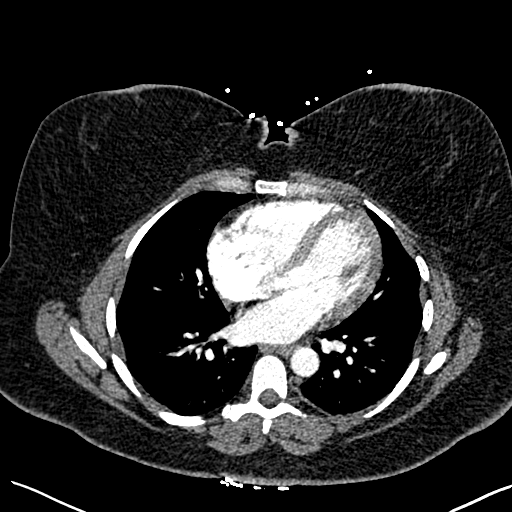
[im 118/263  lung]
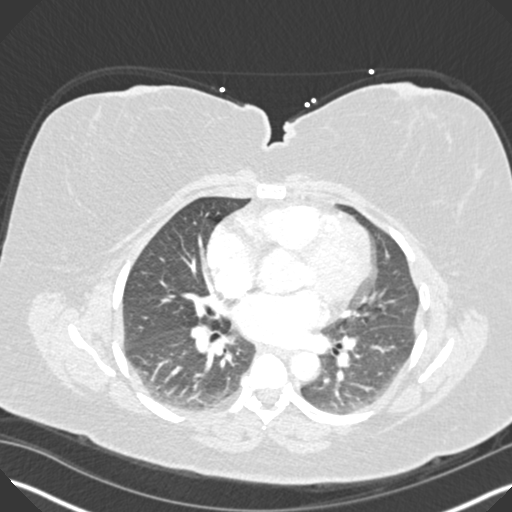
[im 145/263  mediastinal]
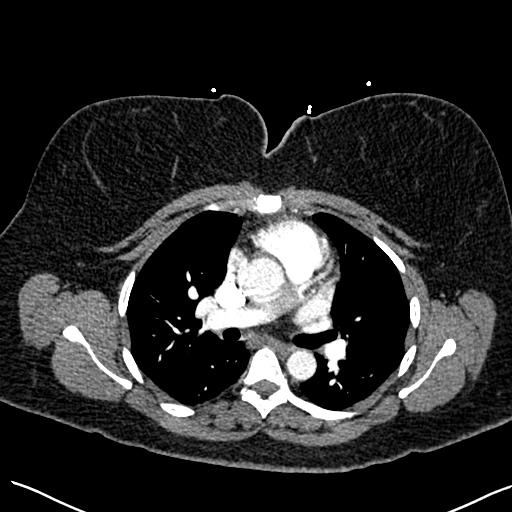
[im 158/263  lung]
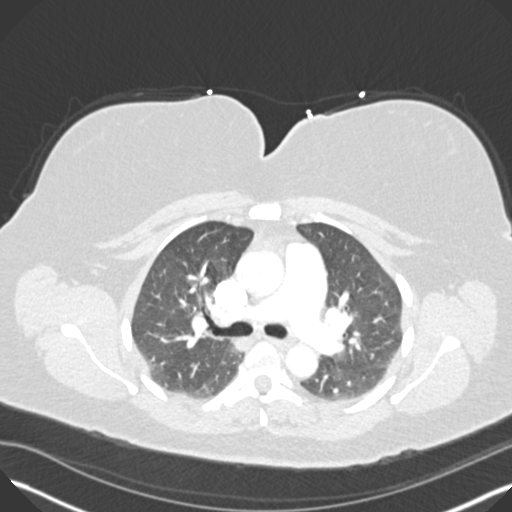
[im 171/263  mediastinal]
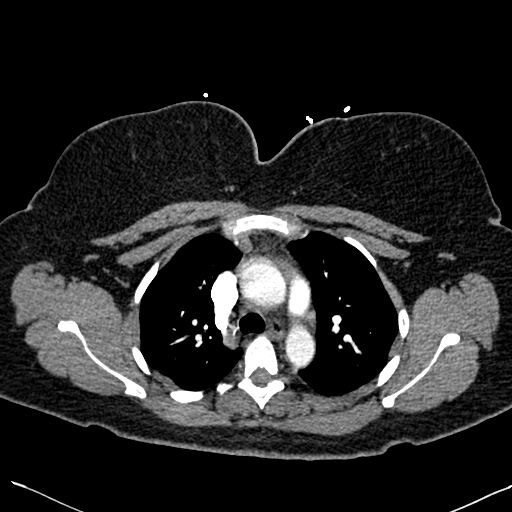
[im 184/263  lung]
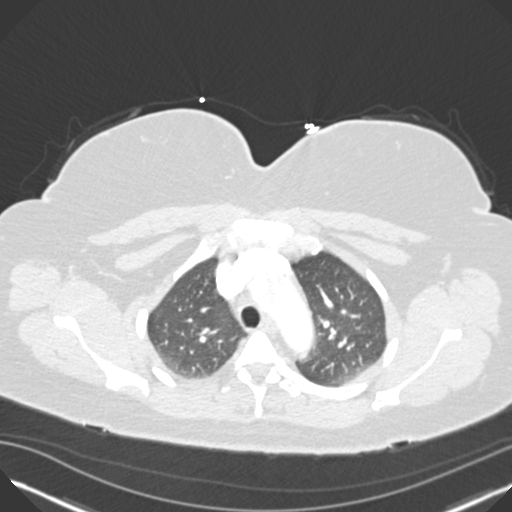
[im 197/263  mediastinal]
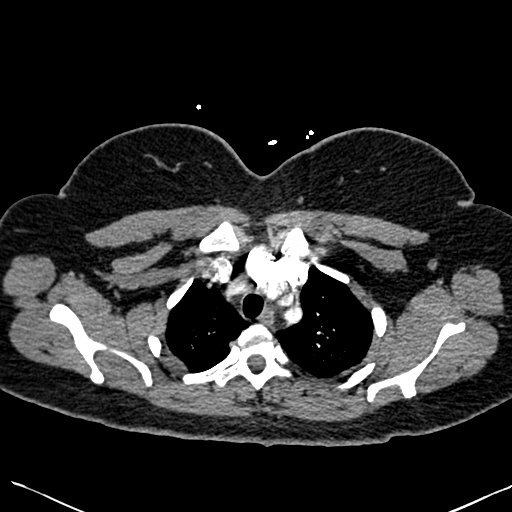
[im 210/263  lung]
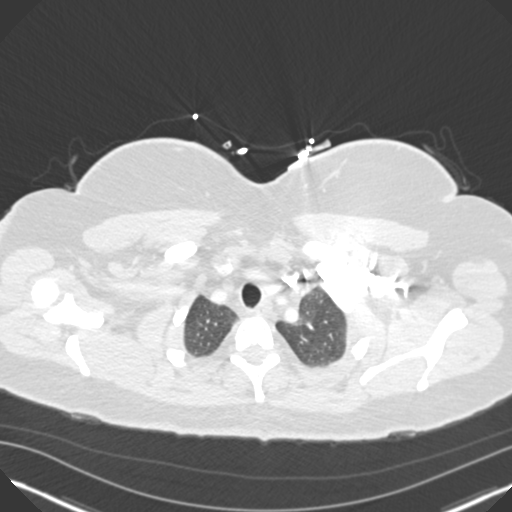
[im 223/263  mediastinal]
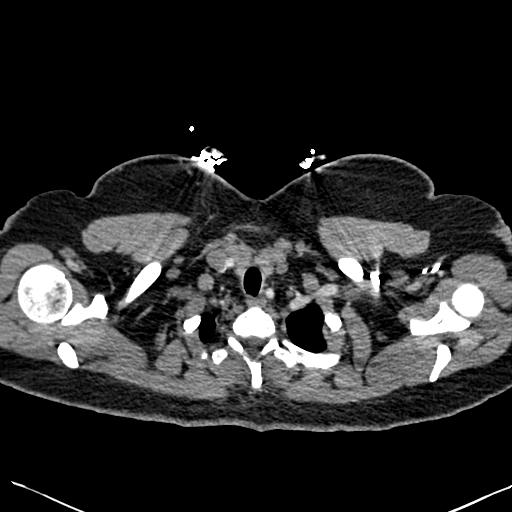
[im 236/263  lung]
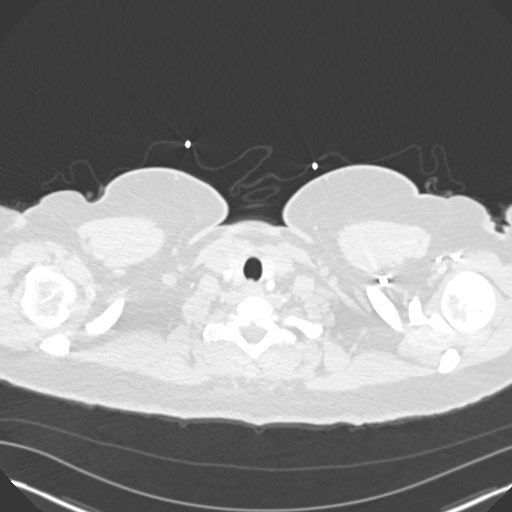
[im 249/263  mediastinal]
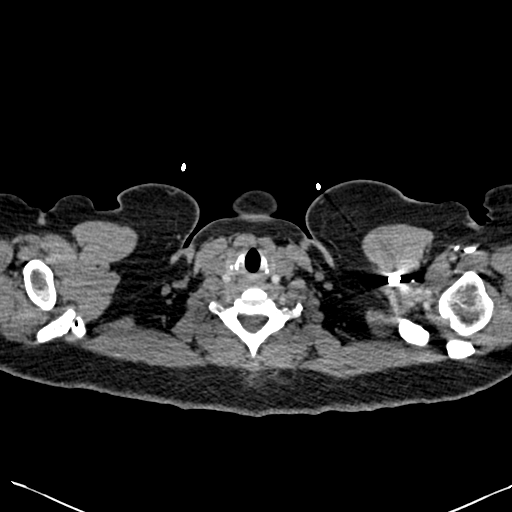

[Series 6: coronal mpr · coronal · 0.54mm/px · 1 of 151 slices shown]
[im 76/151  mediastinal]
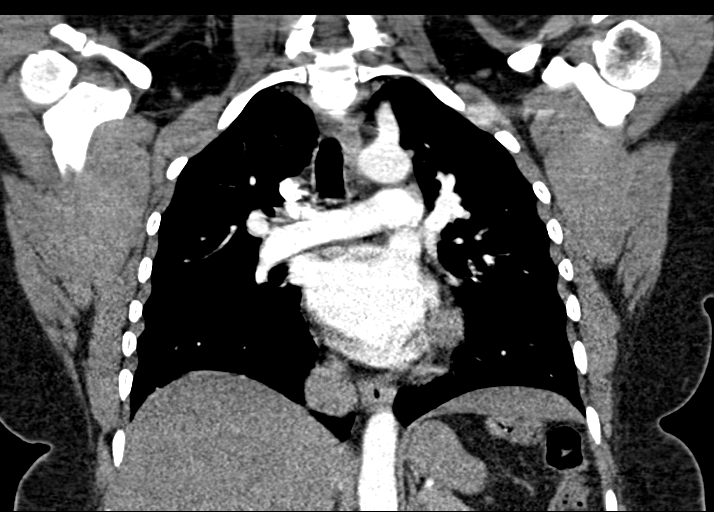

[19 of 36 positions shown; findings below may reference images not displayed]

FINDINGS: Cardiovascular: No filling defects within the pulmonary arteries to
suggest acute pulmonary embolism. No acute findings of the aorta or
great vessels. No pericardial fluid. No significant vascular
findings. Normal heart size. No pericardial effusion.

Mediastinum/Nodes: No axillary supraclavicular adenopathy. No
mediastinal hilar adenopathy. No pericardial effusion. Esophagus
normal.

Lungs/Pleura: No airspace disease. No pulmonary infarction. No
pleural fluid. No pneumothorax. Airways normal.

Upper Abdomen: Limited view of the liver, kidneys, pancreas are
unremarkable. Normal adrenal glands.

Musculoskeletal: No aggressive osseous lesion.

Review of the MIP images confirms the above findings.
IMPRESSION: 1. No evidence acute pulmonary embolism.
2. No pulmonary parenchymal abnormality.

## 2019-02-13 ENCOUNTER — Other Ambulatory Visit: Payer: Self-pay | Admitting: Family Medicine

## 2019-03-03 ENCOUNTER — Telehealth: Payer: Self-pay

## 2019-03-03 NOTE — Telephone Encounter (Signed)
Patient came into the clinic would like to speak to Provider.  CMA inform provider is currently seeing patient. Pt. Would like PCP to give her a call.  CMA was able to talk to patient. Patient stated she is seeing someone for depression and they put her Sertraline 50 mg.   Pt. Wanted to know if she still need to take Buspirone.

## 2019-03-05 NOTE — Telephone Encounter (Signed)
Yes they are not in the same family and can be taken together. She can also use mychart for messages

## 2019-03-09 NOTE — Telephone Encounter (Signed)
Spoke to patient and inform on PCP advising.  Pt. Understood.

## 2019-03-10 ENCOUNTER — Encounter: Payer: Self-pay | Admitting: Nurse Practitioner

## 2019-03-13 NOTE — Telephone Encounter (Signed)
Please advise or schedule a tele visit with PCP.

## 2019-03-13 NOTE — Telephone Encounter (Signed)
Will route to PCP 

## 2019-04-14 ENCOUNTER — Other Ambulatory Visit: Payer: Self-pay | Admitting: Nurse Practitioner

## 2019-04-14 DIAGNOSIS — F419 Anxiety disorder, unspecified: Secondary | ICD-10-CM

## 2019-04-18 ENCOUNTER — Other Ambulatory Visit: Payer: Self-pay | Admitting: Cardiology

## 2019-04-18 DIAGNOSIS — Z20822 Contact with and (suspected) exposure to covid-19: Secondary | ICD-10-CM

## 2019-04-19 LAB — NOVEL CORONAVIRUS, NAA: SARS-CoV-2, NAA: NOT DETECTED

## 2019-05-10 ENCOUNTER — Telehealth: Payer: Self-pay

## 2019-05-10 ENCOUNTER — Other Ambulatory Visit: Payer: Self-pay | Admitting: Nurse Practitioner

## 2019-05-10 DIAGNOSIS — E1169 Type 2 diabetes mellitus with other specified complication: Secondary | ICD-10-CM

## 2019-05-10 DIAGNOSIS — E1165 Type 2 diabetes mellitus with hyperglycemia: Secondary | ICD-10-CM

## 2019-05-10 DIAGNOSIS — E785 Hyperlipidemia, unspecified: Secondary | ICD-10-CM

## 2019-05-10 MED ORDER — PRAVASTATIN SODIUM 40 MG PO TABS: 40.0000 mg | ORAL_TABLET | Freq: Every day | ORAL | 2 refills | Status: DC

## 2019-05-10 NOTE — Telephone Encounter (Signed)
SPOKE TO PT VIA PHONE 12:58PM 05/10/19-PT AWARE HER PRAVASTATIN WAS INCREASED TO 40MG  AND ADVISED PT TO TAKE OTC FISH OIL 2G BID AS WELL. LOVAZA DENIED THRU MEDICAID PRIOR APPROVAL.

## 2019-05-10 NOTE — Telephone Encounter (Signed)
Please let patient know her pravastatin has been increased to 40 mg from 20 mg and she can take fish oil over-the-counter 2 g twice a day.

## 2019-05-10 NOTE — Telephone Encounter (Signed)
Pt's Lovaza is not covered under pt's Medicaid until she has tried and failed both Fenofibrate and Gemfibrozil.  If appropriate, can you change therapy and send in a new script?

## 2019-05-29 ENCOUNTER — Ambulatory Visit: Payer: Medicaid Other | Attending: Internal Medicine

## 2019-05-29 DIAGNOSIS — R238 Other skin changes: Secondary | ICD-10-CM

## 2019-05-29 DIAGNOSIS — U071 COVID-19: Secondary | ICD-10-CM

## 2019-05-31 LAB — NOVEL CORONAVIRUS, NAA: SARS-CoV-2, NAA: NOT DETECTED

## 2019-07-03 ENCOUNTER — Other Ambulatory Visit: Payer: Self-pay | Admitting: Nurse Practitioner

## 2019-07-03 DIAGNOSIS — I10 Essential (primary) hypertension: Secondary | ICD-10-CM

## 2019-07-03 DIAGNOSIS — F419 Anxiety disorder, unspecified: Secondary | ICD-10-CM

## 2019-07-07 MED ORDER — BUSPIRONE HCL 5 MG PO TABS: 10.0000 mg | ORAL_TABLET | Freq: Three times a day (TID) | ORAL | 0 refills | Status: DC | PRN

## 2019-07-07 MED ORDER — AMLODIPINE BESYLATE 10 MG PO TABS: 10.0000 mg | ORAL_TABLET | Freq: Every day | ORAL | 0 refills | Status: DC

## 2019-07-07 NOTE — Telephone Encounter (Signed)
Please fill if appropriate.  

## 2019-07-13 ENCOUNTER — Other Ambulatory Visit: Payer: Self-pay | Admitting: Nurse Practitioner

## 2019-07-13 DIAGNOSIS — I10 Essential (primary) hypertension: Secondary | ICD-10-CM

## 2019-08-03 ENCOUNTER — Other Ambulatory Visit: Payer: Self-pay

## 2019-08-03 ENCOUNTER — Encounter: Payer: Self-pay | Admitting: Nurse Practitioner

## 2019-08-03 ENCOUNTER — Ambulatory Visit: Payer: Medicaid Other | Attending: Nurse Practitioner | Admitting: Nurse Practitioner

## 2019-08-03 VITALS — BP 111/76 | HR 114 | Temp 97.5°F | Ht 62.0 in | Wt 203.0 lb

## 2019-08-03 DIAGNOSIS — Z3044 Encounter for surveillance of vaginal ring hormonal contraceptive device: Secondary | ICD-10-CM | POA: Insufficient documentation

## 2019-08-03 DIAGNOSIS — I10 Essential (primary) hypertension: Secondary | ICD-10-CM

## 2019-08-03 DIAGNOSIS — E1165 Type 2 diabetes mellitus with hyperglycemia: Secondary | ICD-10-CM | POA: Diagnosis present

## 2019-08-03 DIAGNOSIS — R000 Tachycardia, unspecified: Secondary | ICD-10-CM

## 2019-08-03 DIAGNOSIS — E781 Pure hyperglyceridemia: Secondary | ICD-10-CM

## 2019-08-03 DIAGNOSIS — Z7984 Long term (current) use of oral hypoglycemic drugs: Secondary | ICD-10-CM | POA: Diagnosis not present

## 2019-08-03 DIAGNOSIS — D72829 Elevated white blood cell count, unspecified: Secondary | ICD-10-CM

## 2019-08-03 DIAGNOSIS — F17210 Nicotine dependence, cigarettes, uncomplicated: Secondary | ICD-10-CM | POA: Insufficient documentation

## 2019-08-03 DIAGNOSIS — F419 Anxiety disorder, unspecified: Secondary | ICD-10-CM

## 2019-08-03 DIAGNOSIS — Z79899 Other long term (current) drug therapy: Secondary | ICD-10-CM | POA: Diagnosis not present

## 2019-08-03 DIAGNOSIS — Z3040 Encounter for surveillance of contraceptives, unspecified: Secondary | ICD-10-CM

## 2019-08-03 DIAGNOSIS — E785 Hyperlipidemia, unspecified: Secondary | ICD-10-CM | POA: Insufficient documentation

## 2019-08-03 DIAGNOSIS — F17200 Nicotine dependence, unspecified, uncomplicated: Secondary | ICD-10-CM

## 2019-08-03 LAB — POCT GLYCOSYLATED HEMOGLOBIN (HGB A1C): Hemoglobin A1C: 7.8 % — AB (ref 4.0–5.6)

## 2019-08-03 LAB — GLUCOSE, POCT (MANUAL RESULT ENTRY): POC Glucose: 182 mg/dl — AB (ref 70–99)

## 2019-08-03 MED ORDER — OMEGA-3-ACID ETHYL ESTERS 1 G PO CAPS: 2.0000 g | ORAL_CAPSULE | Freq: Two times a day (BID) | ORAL | 1 refills | Status: DC

## 2019-08-03 MED ORDER — JANUMET 50-1000 MG PO TABS: 1.0000 | ORAL_TABLET | Freq: Two times a day (BID) | ORAL | 2 refills | Status: DC

## 2019-08-03 MED ORDER — LISINOPRIL-HYDROCHLOROTHIAZIDE 10-12.5 MG PO TABS: 1.0000 | ORAL_TABLET | Freq: Every day | ORAL | 1 refills | Status: DC

## 2019-08-03 MED ORDER — ETONOGESTREL-ETHINYL ESTRADIOL 0.12-0.015 MG/24HR VA RING: VAGINAL_RING | VAGINAL | 6 refills | Status: DC

## 2019-08-03 MED ORDER — BUSPIRONE HCL 5 MG PO TABS: 10.0000 mg | ORAL_TABLET | Freq: Three times a day (TID) | ORAL | 1 refills | Status: DC | PRN

## 2019-08-03 MED ORDER — PRAVASTATIN SODIUM 40 MG PO TABS: 40.0000 mg | ORAL_TABLET | Freq: Every day | ORAL | 2 refills | Status: DC

## 2019-08-03 MED ORDER — AMLODIPINE BESYLATE 10 MG PO TABS: 10.0000 mg | ORAL_TABLET | Freq: Every day | ORAL | 1 refills | Status: DC

## 2019-08-03 NOTE — Progress Notes (Signed)
Assessment & Plan:  Samantha [name] was seen today for follow-up.  Diagnoses and all orders for this visit:  Type 2 diabetes mellitus with hyperglycemia, without long-term current use of insulin (HCC) -     Glucose (CBG) -     HgB A1c -     Microalbumin/Creatinine Ratio, Urine -     CMP14+EGFR -     pravastatin (PRAVACHOL) 40 MG tablet; Take 1 tablet (40 mg total) by mouth daily. -     sitaGLIPtin-metformin (JANUMET) 50-1000 MG tablet; Take 1 tablet by mouth 2 (two) times daily with a meal. Continue blood sugar control as discussed in office today, low carbohydrate diet, and regular physical exercise as tolerated, 150 minutes per week (30 min each day, 5 days per week, or 50 min 3 days per week). Keep blood sugar logs with fasting goal of 90-130 mg/dl, post prandial (after you eat) less than 180.  For Hypoglycemia: BS <60 and Hyperglycemia BS >400; contact the clinic ASAP. Annual eye exams and foot exams are recommended.  Tachycardia -     CBC -     TSH -     CBC with Differential  Essential hypertension -     amLODipine (NORVASC) 10 MG tablet; Take 1 tablet (10 mg total) by mouth daily. . -     lisinopril-hydrochlorothiazide (ZESTORETIC) 10-12.5 MG tablet; Take 1 tablet by mouth daily. Continue all antihypertensives as prescribed.  Remember to bring in your blood pressure log with you for your follow up appointment.  DASH/Mediterranean Diets are healthier choices for HTN.    Anxiety -     busPIRone (BUSPAR) 5 MG tablet; Take 2 tablets (10 mg total) by mouth 3 (three) times daily as needed.  Hypertriglyceridemia -     Lipid panel -     omega-3 acid ethyl esters (LOVAZA) 1 g capsule; Take 2 capsules (2 g total) by mouth 2 (two) times daily. -     pravastatin (PRAVACHOL) 40 MG tablet; Take 1 tablet (40 mg total) by mouth daily. INSTRUCTIONS: Work on a low fat, heart healthy diet and participate in regular aerobic exercise program by working out at least 150 minutes per week; 5 days a  week-30 minutes per day. Avoid red meat/beef/steak,  fried foods. junk foods, sodas, sugary drinks, unhealthy snacking, alcohol and smoking.  Drink at least 80 oz of water per day and monitor your carbohydrate intake daily.    Tobacco dependence Samantha [name] was counseled on the dangers of tobacco use, and was advised to quit. Reviewed strategies to maximize success, including removing cigarettes and smoking materials from environment, stress management and support of family/friends as well as pharmacological alternatives including: Wellbutrin, Chantix, Nicotine patch, Nicotine gum or lozenges. Smoking cessation support: smoking cessation hotline: 1-800-QUIT-NOW.  Smoking cessation classes are also available through [facility] and [facility]. Call [phone] or visit our website at [email or web address]    Samantha [name] is not quite ready to quit.   Leukocytosis, unspecified type -     CBC with Differential  Encounter for refill of prescription for contraception -     etonogestrel-ethinyl estradiol (NUVARING) 0.12-0.015 MG/24HR vaginal ring; Insert vaginally and leave in place for 3 consecutive weeks, then remove for 1 week.    Patient has been counseled on age-appropriate routine health concerns for screening and prevention. These are reviewed and up-to-date. Referrals have been placed accordingly. Immunizations are up-to-date or declined.    Subjective:   Chief Complaint  Patient presents with  . Follow-up  Pt. is here for a follow up.    HPI Samantha [name] 39 y.o. female presents to office today for follow up.  has a past medical history of Diabetes mellitus without complication ([place]), Hyperlipidemia, and Hypertension.    DM TYPE 2 Increased from 6.8 to 7.8. Weight is up. Would like for her to work on her weight (exercise) and dietary modifications (low carb/low fat/low chol). Will not increase medications or add at this time.  Lab Results  Component Value Date   HGBA1C 7.8  (A) 08/03/2019  .  Essential Hypertension Not well controlled today. Taking amlodipine 10 mg and lisinopril-hctz 10.12.5 mg. Recent blood pressures have been controlled. Denies chest pain, shortness of breath, palpitations, lightheadedness, dizziness, headaches or BLE edema.  BP Readings from Last 3 Encounters:  08/23/19 (!) 141/98  08/03/19 111/76  12/19/18 115/75   Dyslipidemia LDL not at goal of <70. Taking pravastatin 40 mg daily and lovaza 2 g BID. Denies statin intolerance or myalgias.  Lab Results  Component Value Date   LDLCALC 86 08/03/2019    Review of Systems  Constitutional: Negative for fever, malaise/fatigue and weight loss.  HENT: Negative.  Negative for nosebleeds.   Eyes: Negative.  Negative for blurred vision, double vision and photophobia.  Respiratory: Negative.  Negative for cough and shortness of breath.   Cardiovascular: Negative.  Negative for chest pain, palpitations and leg swelling.  Gastrointestinal: Negative.  Negative for heartburn, nausea and vomiting.  Musculoskeletal: Negative.  Negative for myalgias.  Neurological: Negative.  Negative for dizziness, focal weakness, seizures and headaches.  Psychiatric/Behavioral: Negative.  Negative for suicidal ideas.    Past Medical History:  Diagnosis Date  . Diabetes mellitus without complication ([place])   . Hyperlipidemia   . Hypertension     History reviewed. No pertinent surgical history.  Family History  Problem Relation Age of Onset  . Cancer Father     Social History Reviewed with no changes to be made today.   Outpatient Medications Prior to Visit  Medication Sig Dispense Refill  . albuterol (VENTOLIN HFA) 108 (90 Base) MCG/ACT inhaler Inhale 2 puffs into the lungs every 6 (six) hours as needed for wheezing or shortness of breath. 6.7 g 2  . Blood Pressure Monitor DEVI Please provide patient with insurance approved blood pressure monitor 1 Device 0  . Misc. Devices MISC Please provide patient  with insurance approved blood pressure monitor 1 each 0  . amLODipine (NORVASC) 10 MG tablet Take 1 tablet (10 mg total) by mouth daily. Must have appt for more refills. 30 tablet 0  . busPIRone (BUSPAR) 5 MG tablet Take 2 tablets (10 mg total) by mouth 3 (three) times daily as needed. Must have office visit for refills 60 tablet 0  . etonogestrel-ethinyl estradiol (NUVARING) 0.12-0.015 MG/24HR vaginal ring INSERT 1 RING VAGINALLY FOR 3 WEEKS THEN REMOVE FOR 1 WEEK 1 each 2  . lisinopril-hydrochlorothiazide (ZESTORETIC) 10-12.5 MG tablet Take 1 tablet by mouth daily. Please schedule PCP appointment. 30 tablet 0  . pravastatin (PRAVACHOL) 40 MG tablet Take 1 tablet (40 mg total) by mouth daily. 90 tablet 2  . omega-3 acid ethyl esters (LOVAZA) 1 g capsule Take 2 capsules (2 g total) by mouth 2 (two) times daily. 360 capsule 1  . sitaGLIPtin-metformin (JANUMET) 50-1000 MG tablet Take 1 tablet by mouth 2 (two) times daily with a meal. 180 tablet 2   No facility-administered medications prior to visit.    No Known Allergies  Objective:    BP 111/76 (BP Location: Left Arm, Patient Position: Sitting, Cuff Size: Large)   Pulse (!) 114   Temp (!) 97.5 F (36.4 C) (Temporal)   Ht '5\' 2"'  (1.575 m)   Wt 203 lb (92.1 kg)   SpO2 95%   BMI 37.13 kg/m  Wt Readings from Last 3 Encounters:  08/23/19 203 lb 12.8 oz (92.4 kg)  08/03/19 203 lb (92.1 kg)  12/19/18 198 lb (89.8 kg)    Physical Exam Vitals and nursing note reviewed.  Constitutional:      Appearance: She is well-developed.  HENT:     Head: Normocephalic and atraumatic.  Cardiovascular:     Rate and Rhythm: Regular rhythm. Tachycardia present.     Heart sounds: Normal heart sounds. No murmur. No friction rub. No gallop.   Pulmonary:     Effort: Pulmonary effort is normal. No tachypnea or respiratory distress.     Breath sounds: Normal breath sounds. No decreased breath sounds, wheezing, rhonchi or rales.  Chest:     Chest wall:  No tenderness.  Abdominal:     General: Bowel sounds are normal.     Palpations: Abdomen is soft.  Musculoskeletal:        General: Normal range of motion.     Cervical back: Normal range of motion.  Skin:    General: Skin is warm and dry.  Neurological:     Mental Status: She is alert and oriented to person, place, and time.     Coordination: Coordination normal.  Psychiatric:        Behavior: Behavior normal. Behavior is cooperative.        Thought Content: Thought content normal.        Judgment: Judgment normal.          Patient has been counseled extensively about nutrition and exercise as well as the importance of adherence with medications and regular follow-up. The patient was given clear instructions to go to ER or return to medical center if symptoms don't improve, worsen or new problems develop. The patient verbalized understanding.   Follow-up: Return in 26 days (on 08/29/2019) for PAP SMEAR /EKG.   [name], FNP-BC [facility] and [place], [place]   09/01/2019, 4:41 PM

## 2019-08-04 ENCOUNTER — Other Ambulatory Visit: Payer: Self-pay | Admitting: Nurse Practitioner

## 2019-08-04 DIAGNOSIS — D72829 Elevated white blood cell count, unspecified: Secondary | ICD-10-CM

## 2019-08-04 LAB — CBC WITH DIFFERENTIAL/PLATELET
Basophils Absolute: 0.1 10*3/uL (ref 0.0–0.2)
Basos: 1 %
EOS (ABSOLUTE): 0.3 10*3/uL (ref 0.0–0.4)
Eos: 2 %
Hematocrit: 41.7 % (ref 34.0–46.6)
Hemoglobin: 14.3 g/dL (ref 11.1–15.9)
Immature Grans (Abs): 0 10*3/uL (ref 0.0–0.1)
Immature Granulocytes: 0 %
Lymphocytes Absolute: 4.4 10*3/uL — ABNORMAL HIGH (ref 0.7–3.1)
Lymphs: 25 %
MCH: 29 pg (ref 26.6–33.0)
MCHC: 34.3 g/dL (ref 31.5–35.7)
MCV: 85 fL (ref 79–97)
Monocytes Absolute: 1.1 10*3/uL — ABNORMAL HIGH (ref 0.1–0.9)
Monocytes: 6 %
Neutrophils Absolute: 12.1 10*3/uL — ABNORMAL HIGH (ref 1.4–7.0)
Neutrophils: 66 %
Platelets: 384 10*3/uL (ref 150–450)
RBC: 4.93 x10E6/uL (ref 3.77–5.28)
RDW: 13.3 % (ref 11.7–15.4)
WBC: 18.1 10*3/uL — ABNORMAL HIGH (ref 3.4–10.8)

## 2019-08-04 LAB — CMP14+EGFR
ALT: 14 IU/L (ref 0–32)
AST: 15 IU/L (ref 0–40)
Albumin/Globulin Ratio: 1.3 (ref 1.2–2.2)
Albumin: 4.1 g/dL (ref 3.8–4.8)
Alkaline Phosphatase: 54 IU/L (ref 39–117)
BUN/Creatinine Ratio: 12 (ref 9–23)
BUN: 9 mg/dL (ref 6–20)
Bilirubin Total: 0.2 mg/dL (ref 0.0–1.2)
CO2: 22 mmol/L (ref 20–29)
Calcium: 10.1 mg/dL (ref 8.7–10.2)
Chloride: 99 mmol/L (ref 96–106)
Creatinine, Ser: 0.73 mg/dL (ref 0.57–1.00)
GFR calc Af Amer: 121 mL/min/{1.73_m2} (ref >59)
GFR calc non Af Amer: 105 mL/min/{1.73_m2} (ref >59)
Globulin, Total: 3.1 g/dL (ref 1.5–4.5)
Glucose: 167 mg/dL — ABNORMAL HIGH (ref 65–99)
Potassium: 4.2 mmol/L (ref 3.5–5.2)
Sodium: 138 mmol/L (ref 134–144)
Total Protein: 7.2 g/dL (ref 6.0–8.5)

## 2019-08-04 LAB — LIPID PANEL
Chol/HDL Ratio: 3.7 ratio (ref 0.0–4.4)
Cholesterol, Total: 209 mg/dL — ABNORMAL HIGH (ref 100–199)
HDL: 56 mg/dL (ref >39)
LDL Chol Calc (NIH): 86 mg/dL (ref 0–99)
Triglycerides: 413 mg/dL — ABNORMAL HIGH (ref 0–149)
VLDL Cholesterol Cal: 67 mg/dL — ABNORMAL HIGH (ref 5–40)

## 2019-08-04 LAB — MICROALBUMIN / CREATININE URINE RATIO
Creatinine, Urine: 55.4 mg/dL
Microalb/Creat Ratio: 10 mg/g creat (ref 0–29)
Microalbumin, Urine: 5.4 ug/mL

## 2019-08-04 LAB — TSH: TSH: 2.21 u[IU]/mL (ref 0.450–4.500)

## 2019-08-07 ENCOUNTER — Encounter: Payer: Self-pay | Admitting: Hematology

## 2019-08-07 ENCOUNTER — Telehealth: Payer: Self-pay | Admitting: Hematology

## 2019-08-07 ENCOUNTER — Encounter: Payer: Self-pay | Admitting: Nurse Practitioner

## 2019-08-07 NOTE — Telephone Encounter (Signed)
Received a new pt referral from Geryl Rankins, NP at [facility]. Ms. Samantha [name] has been cld and scheduled to see Dr. Irene Limbo on 3/30 at 1pm. Pt aware to arrive 15 minutes early. Letter mailed.

## 2019-08-09 ENCOUNTER — Telehealth: Payer: Self-pay | Admitting: Hematology and Oncology

## 2019-08-09 NOTE — Telephone Encounter (Signed)
Rescheduled per 3/10 sch msg, pt req. Called and spoke with pt, confirmed 3/24 appt

## 2019-08-22 NOTE — Progress Notes (Signed)
[facility] [phone]   Fax:([phone] [place] NOTE  Patient Care Team: [name], NP as PCP - General (Nurse Practitioner)  Hematological/Oncological History # Leukocytosis, Neutrophilic Predominance  1) 02/05/2008: WBC 11.2, Hgb 11.0, Plt 284 2) 08/15/2009: WBC 12.5, Hgb 11.9, MCV 90.4, Plt 240 3) 07/06/2016: WBC 15.3, Hgb 12.6, Plt 346, MCV 85 4) 08/14/2017: WBC 14.4, Hgb 12.9, MCV 84, Plt 303 5) 12/19/2018: WBC 14.7, Hgb 14.0, MCV 90, Plt 389 6) 08/03/2019: WBC 18.1, Hgb 14.3, MCV 85, Plt 384 7) 08/23/2019: Establish care with Dr. Lorenso Courier    CHIEF COMPLAINTS/PURPOSE OF CONSULTATION:  " Leukocytosis  "  HISTORY OF PRESENTING ILLNESS:  Samantha [name] 39 y.o. female with medical history significant for DM type II, HLD, and HTN who presents for evaluation of longstanding leukocytosis.   On review of the previous records Mrs. Samantha [name] has a longstanding leukocytosis dating back to at least September 2009.  At that time showed a white blood cell count 11.2, hemoglobin 11, and platelets of 284.  She is consistently had white blood cell counts ranging from 11-15 from 2011 until 2020.  Most recently on 08/03/2019 the patient had a CBC drawn which showed a white blood cell count 18.1, hemoglobin of 14.3, MCV of 85, and platelets of 3 4.  Due to concern for this longstanding leukocytosis the patient was referred to hematology for further evaluation and management.  On exam today Mrs. Samantha [name] notes that she has been well.  She notes that she does occasionally have night sweats and she is unsure how long these have gone on for.  She notes that she they have never been soaking through her clothes or into the sheets, but occasionally she does have to change pillows.  She notes that her weight has been stable over the last several months to years and has been in approximately 200 pounds.  She does note some shortness of breath, but no cough.  She denies having any  nausea, vomiting, or diarrhea.  She also notes that she does not have any joint pains or rash.  She has not had any infectious symptoms as well.  A full 10 point ROS is listed below.  On further discussion the patient notes that she does not have any family history of any blood disorders.  She notes that her mother did have blood clots, but she is unsure how she passed away.  Her father passed away of lung cancer. Also of note her family history does not include any rheumatological disorders. The patient is an active smoker and smokes approximately 10 cigarettes/day.  She notes that she used to smoke more at 1 pack/day and that she started at the age of 56.  She notes that she is trying to quit and wants to quit, but has not had any success with this so far.  She denies any alcohol consumption or use of drugs.    MEDICAL HISTORY:  Past Medical History:  Diagnosis Date  . Diabetes mellitus without complication ([place])   . Hyperlipidemia   . Hypertension     SURGICAL HISTORY: No past surgical history on file.  SOCIAL HISTORY: Social History   Socioeconomic History  . Marital status: Single    Spouse name: Not on file  . Number of children: Not on file  . Years of education: Not on file  . Highest education level: Not on file  Occupational History  . Not on file  Tobacco Use  .  Smoking status: Current Every Day Smoker    Packs/day: 0.50    Years: 0.40    Pack years: 0.20    Types: Cigarettes  . Smokeless tobacco: Never Used  Substance and Sexual Activity  . Alcohol use: No  . Drug use: No  . Sexual activity: Yes    Birth control/protection: None  Other Topics Concern  . Not on file  Social History Narrative  . Not on file   Social Determinants of Health   Financial Resource Strain:   . Difficulty of Paying Living Expenses:   Food Insecurity:   . Worried About Charity fundraiser in the Last Year:   . Arboriculturist in the Last Year:   Transportation Needs:   . Lexicographer (Medical):   Marland Kitchen Lack of Transportation (Non-Medical):   Physical Activity:   . Days of Exercise per Week:   . Minutes of Exercise per Session:   Stress:   . Feeling of Stress :   Social Connections:   . Frequency of Communication with Friends and Family:   . Frequency of Social Gatherings with Friends and Family:   . Attends Religious Services:   . Active Member of Clubs or Organizations:   . Attends Archivist Meetings:   Marland Kitchen Marital Status:   Intimate Partner Violence:   . Fear of Current or Ex-Partner:   . Emotionally Abused:   Marland Kitchen Physically Abused:   . Sexually Abused:     FAMILY HISTORY: Family History  Problem Relation Age of Onset  . Cancer Father     ALLERGIES:  has No Known Allergies.  MEDICATIONS:  Current Outpatient Medications  Medication Sig Dispense Refill  . albuterol (VENTOLIN HFA) 108 (90 Base) MCG/ACT inhaler Inhale 2 puffs into the lungs every 6 (six) hours as needed for wheezing or shortness of breath. 6.7 g 2  . amLODipine (NORVASC) 10 MG tablet Take 1 tablet (10 mg total) by mouth daily. . 90 tablet 1  . Blood Pressure Monitor DEVI Please provide patient with insurance approved blood pressure monitor 1 Device 0  . busPIRone (BUSPAR) 5 MG tablet Take 2 tablets (10 mg total) by mouth 3 (three) times daily as needed. 60 tablet 1  . etonogestrel-ethinyl estradiol (NUVARING) 0.12-0.015 MG/24HR vaginal ring Insert vaginally and leave in place for 3 consecutive weeks, then remove for 1 week. 1 each 6  . lisinopril-hydrochlorothiazide (ZESTORETIC) 10-12.5 MG tablet Take 1 tablet by mouth daily. 90 tablet 1  . Misc. Devices MISC Please provide patient with insurance approved blood pressure monitor 1 each 0  . omega-3 acid ethyl esters (LOVAZA) 1 g capsule Take 2 capsules (2 g total) by mouth 2 (two) times daily. 360 capsule 1  . pravastatin (PRAVACHOL) 40 MG tablet Take 1 tablet (40 mg total) by mouth daily. 90 tablet 2  .  sitaGLIPtin-metformin (JANUMET) 50-1000 MG tablet Take 1 tablet by mouth 2 (two) times daily with a meal. 180 tablet 2   No current facility-administered medications for this visit.    REVIEW OF SYSTEMS:   Constitutional: ( - ) fevers, ( - )  chills , ( + ) night sweats Eyes: ( - ) blurriness of vision, ( - ) double vision, ( - ) watery eyes Ears, nose, mouth, throat, and face: ( - ) mucositis, ( - ) sore throat Respiratory: ( - ) cough, ( - ) dyspnea, ( - ) wheezes Cardiovascular: ( - ) palpitation, ( - ) chest  discomfort, ( - ) lower extremity swelling Gastrointestinal:  ( - ) nausea, ( - ) heartburn, ( - ) change in bowel habits Skin: ( - ) abnormal skin rashes Lymphatics: ( - ) new lymphadenopathy, ( - ) easy bruising Neurological: ( - ) numbness, ( - ) tingling, ( - ) new weaknesses Behavioral/Psych: ( - ) mood change, ( - ) new changes  All other systems were reviewed with the patient and are negative.  PHYSICAL EXAMINATION: ECOG PERFORMANCE STATUS: 0 - Asymptomatic  Vitals:   08/23/19 0933  BP: (!) 141/98  Pulse: 100  Resp: 20  Temp: 97.9 F (36.6 C)  SpO2: 99%   Filed Weights   08/23/19 0933  Weight: 203 lb 12.8 oz (92.4 kg)    GENERAL: well appearing middle aged [place] American female in NAD  SKIN: skin color, texture, turgor are normal, no rashes or significant lesions EYES: conjunctiva are pink and non-injected, sclera clear LUNGS: clear to auscultation and percussion with normal breathing effort HEART: regular rate & rhythm and no murmurs and no lower extremity edemas Musculoskeletal: no cyanosis of digits and no clubbing  PSYCH: alert & oriented x 3, fluent speech NEURO: no focal motor/sensory deficits  LABORATORY DATA:  I have reviewed the data as listed CBC Latest Ref Rng & Units 08/23/2019 08/03/2019 12/19/2018  WBC 4.0 - 10.5 K/uL 14.8(H) 18.1(H) 14.7(H)  Hemoglobin 12.0 - 15.0 g/dL 13.3 14.3 14.0  Hematocrit 36.0 - 46.0 % 39.6 41.7 44.1  Platelets 150  - 400 K/uL 342 384 389    CMP Latest Ref Rng & Units 08/23/2019 08/03/2019 12/19/2018  Glucose 70 - 99 mg/dL 195(H) 167(H) 205(H)  BUN 6 - 20 mg/dL '9 9 11  ' Creatinine 0.44 - 1.00 mg/dL 0.74 0.73 0.71  Sodium 135 - 145 mmol/L 141 138 138  Potassium 3.5 - 5.1 mmol/L 4.0 4.2 3.6  Chloride 98 - 111 mmol/L 106 99 99  CO2 22 - 32 mmol/L 26 22 18(L)  Calcium 8.9 - 10.3 mg/dL 9.5 10.1 9.5  Total Protein 6.5 - 8.1 g/dL 7.1 7.2 6.6  Total Bilirubin 0.3 - 1.2 mg/dL 0.3 0.2 0.2  Alkaline Phos 38 - 126 U/L 46 54 48  AST 15 - 41 U/L '16 15 14  ' ALT 0 - 44 U/L '22 14 15     ' PATHOLOGY: None relevant to review.   BLOOD FILM:  Review of the peripheral blood smear showed normal appearing white cells with neutrophils that were appropriately lobated and granulated. There was no predominance of bi-lobed or hyper-segmented neutrophils appreciated. No Dohle bodies were noted. There was no left shifting, immature forms or blasts noted. Lymphocytes remain normal in size without any predominance of large granular lymphocytes. Rare reactive lymphocyte. Red cells show no anisopoikilocytosis, macrocytes , microcytes or polychromasia. There were no schistocytes, target cells, echinocytes, acanthocytes, dacrocytes, or stomatocytes.There was no rouleaux formation, nucleated red cells, or intra-cellular inclusions noted. The platelets are normal in size, shape, and color without any clumping evident.  RADIOGRAPHIC STUDIES: None relevant to review.  No results found.  ASSESSMENT & PLAN Laqueisha Shepherd 39 y.o. female with medical history significant for DM type II, HLD, and HTN who presents for evaluation of longstanding leukocytosis.  After review the labs and discussion with the patient her findings are most consistent with leukocytosis secondary to smoking.  The possible etiologies for leukocytosis include inflammation, infection, and bone marrow disorder.  The patient has had consistently high white blood cell counts at  least  2009 and has been an active smoker during that period of time.  The patient's leukocytosis is neutrophilic predominance.  She is not currently taking any medications known to cause increase in white blood cell count.    Furthermore the patient has not had any infectious symptoms that would imply there is an infectious etiology.  The patient also does not have any key signs of inflammatory conditions such as rash or joint pain.  In order to effectively rule out these other diagnoses today we will assess for inflammation with ESR and CRP.  And we will conduct an MPN evaluation with JAK2 with reflex and BCR able.  In the event that all of the above labs are negative we will continue to monitor under the assumption that her leukocytosis is secondary to smoking.  #Leukocytosis, neutrophilic predominance --today will repeat CBC, CMP and will review a peripheral blood film --will assess for inflammation of ESR and CRP --MPN evaluation with JAK2 w/ reflex and BCR/ABL -- addtionally will check TSH --most likely etiology is smoking. Recommend she d/c and provided smoking cessation resources. --RTC in 6 months to reassess or sooner if indication by one of the above labs.   Orders Placed This Encounter  Procedures  . CBC with Differential (Cancer Center Only)    Standing Status:   Future    Number of Occurrences:   1    Standing Expiration Date:   08/21/2020  . Save Smear (SSMR)    Standing Status:   Future    Number of Occurrences:   1    Standing Expiration Date:   08/21/2020  . CMP ([facility] only)    Standing Status:   Future    Number of Occurrences:   1    Standing Expiration Date:   08/21/2020  . Sedimentation rate    Standing Status:   Future    Number of Occurrences:   1    Standing Expiration Date:   08/21/2020  . TSH    Standing Status:   Future    Number of Occurrences:   1    Standing Expiration Date:   08/21/2020  . JAK2 (INCLUDING V617F AND EXON 12), MPL,& CALR W/RFL MPN  PANEL (NGS)    Standing Status:   Future    Number of Occurrences:   1    Standing Expiration Date:   08/21/2020  . BCR ABL1 FISH ([facility])    Standing Status:   Future    Number of Occurrences:   1    Standing Expiration Date:   08/21/2020  . C-reactive protein    Standing Status:   Future    Number of Occurrences:   1    Standing Expiration Date:   08/21/2020    All questions were answered. The patient knows to call the clinic with any problems, questions or concerns.  A total of more than 60 minutes were spent on this encounter and over half of that time was spent on counseling and coordination of care as outlined above.   Ledell Peoples, MD Department of Hematology/Oncology [facility] Phone: [phone] Pager: [phone] Email: [email or web address]  08/24/2019 4:03 PM

## 2019-08-23 ENCOUNTER — Inpatient Hospital Stay: Payer: Medicaid Other | Attending: Hematology | Admitting: Hematology and Oncology

## 2019-08-23 ENCOUNTER — Other Ambulatory Visit: Payer: Self-pay

## 2019-08-23 ENCOUNTER — Inpatient Hospital Stay: Payer: Medicaid Other

## 2019-08-23 VITALS — BP 141/98 | HR 100 | Temp 97.9°F | Resp 20 | Ht 62.0 in | Wt 203.8 lb

## 2019-08-23 DIAGNOSIS — D72829 Elevated white blood cell count, unspecified: Secondary | ICD-10-CM | POA: Insufficient documentation

## 2019-08-23 DIAGNOSIS — E785 Hyperlipidemia, unspecified: Secondary | ICD-10-CM | POA: Diagnosis not present

## 2019-08-23 DIAGNOSIS — E119 Type 2 diabetes mellitus without complications: Secondary | ICD-10-CM | POA: Diagnosis not present

## 2019-08-23 DIAGNOSIS — I10 Essential (primary) hypertension: Secondary | ICD-10-CM | POA: Diagnosis not present

## 2019-08-23 DIAGNOSIS — F17210 Nicotine dependence, cigarettes, uncomplicated: Secondary | ICD-10-CM | POA: Diagnosis not present

## 2019-08-23 DIAGNOSIS — Z809 Family history of malignant neoplasm, unspecified: Secondary | ICD-10-CM | POA: Insufficient documentation

## 2019-08-23 DIAGNOSIS — D72825 Bandemia: Secondary | ICD-10-CM

## 2019-08-23 LAB — CBC WITH DIFFERENTIAL (CANCER CENTER ONLY)
Abs Immature Granulocytes: 0.06 10*3/uL (ref 0.00–0.07)
Basophils Absolute: 0.1 10*3/uL (ref 0.0–0.1)
Basophils Relative: 1 %
Eosinophils Absolute: 0.5 10*3/uL (ref 0.0–0.5)
Eosinophils Relative: 3 %
HCT: 39.6 % (ref 36.0–46.0)
Hemoglobin: 13.3 g/dL (ref 12.0–15.0)
Immature Granulocytes: 0 %
Lymphocytes Relative: 21 %
Lymphs Abs: 3.1 10*3/uL (ref 0.7–4.0)
MCH: 27.9 pg (ref 26.0–34.0)
MCHC: 33.6 g/dL (ref 30.0–36.0)
MCV: 83.2 fL (ref 80.0–100.0)
Monocytes Absolute: 0.7 10*3/uL (ref 0.1–1.0)
Monocytes Relative: 5 %
Neutro Abs: 10.4 10*3/uL — ABNORMAL HIGH (ref 1.7–7.7)
Neutrophils Relative %: 70 %
Platelet Count: 342 10*3/uL (ref 150–400)
RBC: 4.76 MIL/uL (ref 3.87–5.11)
RDW: 13.6 % (ref 11.5–15.5)
WBC Count: 14.8 10*3/uL — ABNORMAL HIGH (ref 4.0–10.5)
nRBC: 0 % (ref 0.0–0.2)

## 2019-08-23 LAB — CMP (CANCER CENTER ONLY)
ALT: 22 U/L (ref 0–44)
AST: 16 U/L (ref 15–41)
Albumin: 3.2 g/dL — ABNORMAL LOW (ref 3.5–5.0)
Alkaline Phosphatase: 46 U/L (ref 38–126)
Anion gap: 9 (ref 5–15)
BUN: 9 mg/dL (ref 6–20)
CO2: 26 mmol/L (ref 22–32)
Calcium: 9.5 mg/dL (ref 8.9–10.3)
Chloride: 106 mmol/L (ref 98–111)
Creatinine: 0.74 mg/dL (ref 0.44–1.00)
GFR, Est AFR Am: >60 mL/min (ref >60)
GFR, Estimated: >60 mL/min (ref >60)
Glucose, Bld: 195 mg/dL — ABNORMAL HIGH (ref 70–99)
Potassium: 4 mmol/L (ref 3.5–5.1)
Sodium: 141 mmol/L (ref 135–145)
Total Bilirubin: 0.3 mg/dL (ref 0.3–1.2)
Total Protein: 7.1 g/dL (ref 6.5–8.1)

## 2019-08-23 LAB — SEDIMENTATION RATE: Sed Rate: 8 mm/hr (ref 0–22)

## 2019-08-23 LAB — TSH: TSH: 1.732 u[IU]/mL (ref 0.308–3.960)

## 2019-08-23 LAB — SAVE SMEAR(SSMR), FOR PROVIDER SLIDE REVIEW

## 2019-08-23 LAB — C-REACTIVE PROTEIN: CRP: 1.7 mg/dL — ABNORMAL HIGH (ref <1.0)

## 2019-08-24 ENCOUNTER — Telehealth: Payer: Self-pay | Admitting: Hematology and Oncology

## 2019-08-24 NOTE — Telephone Encounter (Signed)
Scheduled per los. Called and left msg. Mailed printout  °

## 2019-08-28 ENCOUNTER — Ambulatory Visit: Payer: Medicaid Other | Admitting: Nurse Practitioner

## 2019-08-29 ENCOUNTER — Other Ambulatory Visit: Payer: Medicaid Other

## 2019-08-29 ENCOUNTER — Encounter: Payer: Medicaid Other | Admitting: Hematology

## 2019-09-01 ENCOUNTER — Encounter: Payer: Self-pay | Admitting: Nurse Practitioner

## 2019-09-05 NOTE — Telephone Encounter (Signed)
-----   Message from [name], NP sent at 09/01/2019  5:49 PM EDT ----- Needs EKG and Ddimer labs asap

## 2019-09-05 NOTE — Telephone Encounter (Signed)
Attempt to reach patient to schedule a lab and EKG.  No answer and LVM. CMA also sent a MyChart message to patient.

## 2019-09-06 LAB — BCR ABL1 FISH (GENPATH)

## 2019-09-06 LAB — JAK2 (INCLUDING V617F AND EXON 12), MPL,& CALR W/RFL MPN PANEL (NGS)

## 2019-09-15 ENCOUNTER — Other Ambulatory Visit: Payer: Self-pay

## 2019-09-15 ENCOUNTER — Other Ambulatory Visit: Payer: Self-pay | Admitting: Nurse Practitioner

## 2019-09-15 ENCOUNTER — Encounter: Payer: Self-pay | Admitting: *Deleted

## 2019-09-15 ENCOUNTER — Ambulatory Visit: Payer: Medicaid Other | Attending: Nurse Practitioner | Admitting: *Deleted

## 2019-09-15 ENCOUNTER — Encounter: Payer: Self-pay | Admitting: Nurse Practitioner

## 2019-09-15 DIAGNOSIS — I10 Essential (primary) hypertension: Secondary | ICD-10-CM | POA: Diagnosis not present

## 2019-09-15 DIAGNOSIS — R000 Tachycardia, unspecified: Secondary | ICD-10-CM | POA: Diagnosis not present

## 2019-09-15 LAB — D-DIMER, QUANTITATIVE: D-DIMER: 0.65 mg/L FEU — ABNORMAL HIGH (ref 0.00–0.49)

## 2019-09-15 NOTE — Progress Notes (Signed)
EKG was performed on patient today and was handed to provider. Provided informed staff to let patient know that nothing will be done right now with the EKG and will do a STAT D-Dimer. Informed patient with information and she verbalized understanding.

## 2019-09-16 ENCOUNTER — Encounter: Payer: Self-pay | Admitting: Nurse Practitioner

## 2019-10-25 ENCOUNTER — Other Ambulatory Visit: Payer: Self-pay

## 2019-10-25 ENCOUNTER — Ambulatory Visit: Payer: Medicaid Other | Attending: Physician Assistant | Admitting: Physician Assistant

## 2019-10-25 VITALS — BP 138/90 | HR 111 | Ht 62.0 in | Wt 200.0 lb

## 2019-10-25 DIAGNOSIS — R300 Dysuria: Secondary | ICD-10-CM | POA: Diagnosis present

## 2019-10-25 DIAGNOSIS — N3001 Acute cystitis with hematuria: Secondary | ICD-10-CM

## 2019-10-25 DIAGNOSIS — Z7984 Long term (current) use of oral hypoglycemic drugs: Secondary | ICD-10-CM | POA: Insufficient documentation

## 2019-10-25 DIAGNOSIS — M549 Dorsalgia, unspecified: Secondary | ICD-10-CM | POA: Diagnosis present

## 2019-10-25 DIAGNOSIS — E1165 Type 2 diabetes mellitus with hyperglycemia: Secondary | ICD-10-CM | POA: Diagnosis not present

## 2019-10-25 DIAGNOSIS — R109 Unspecified abdominal pain: Secondary | ICD-10-CM | POA: Diagnosis present

## 2019-10-25 LAB — POCT URINALYSIS DIP (CLINITEK)
Bilirubin, UA: NEGATIVE
Glucose, UA: 500 mg/dL — AB
Nitrite, UA: POSITIVE — AB
POC PROTEIN,UA: NEGATIVE
Spec Grav, UA: 1.02 (ref 1.010–1.025)
Urobilinogen, UA: 0.2 E.U./dL
pH, UA: 5.5 (ref 5.0–8.0)

## 2019-10-25 LAB — GLUCOSE, POCT (MANUAL RESULT ENTRY): POC Glucose: 322 mg/dl — AB (ref 70–99)

## 2019-10-25 MED ORDER — FLUCONAZOLE 150 MG PO TABS: 150.0000 mg | ORAL_TABLET | Freq: Once | ORAL | 0 refills | Status: AC

## 2019-10-25 MED ORDER — INSULIN ASPART 100 UNIT/ML ~~LOC~~ SOLN: 15.0000 [IU] | Freq: Once | SUBCUTANEOUS | Status: DC

## 2019-10-25 MED ORDER — NITROFURANTOIN MONOHYD MACRO 100 MG PO CAPS: 100.0000 mg | ORAL_CAPSULE | Freq: Two times a day (BID) | ORAL | 0 refills | Status: DC

## 2019-10-25 NOTE — Progress Notes (Signed)
Samantha [name], is a 39 y.o. female  [id number]  [id number]  DOB - January 02, 1981  Subjective:  Chief Complaint and HPI: Samantha [name] is a 39 y.o. female here today for dysuria x 3 days.  No fever.  Some back pain.  No N/V/D. She has been skipping some of her diabetes doses.  Not following diabetic diet.  No pelvic pain or vaginal discharge.  Not SA since 2018.   ROS:   Constitutional:  No f/c, No night sweats, No unexplained weight loss. EENT:  No vision changes, No blurry vision, No hearing changes. No mouth, throat, or ear problems.  Respiratory: No cough, No SOB Cardiac: No CP, no palpitations GI:  No abd pain, No N/V/D. GU: see above Musculoskeletal: No joint pain Neuro: No headache, no dizziness, no motor weakness.  Skin: No rash Endocrine:  No polydipsia. No polyuria.  Psych: Denies SI/HI  No problems updated.  ALLERGIES: No Known Allergies  PAST MEDICAL HISTORY: Past Medical History:  Diagnosis Date  . Diabetes mellitus without complication ([place])   . Hyperlipidemia   . Hypertension     MEDICATIONS AT HOME: Prior to Admission medications   Medication Sig Start Date End Date Taking? Authorizing Provider  albuterol (VENTOLIN HFA) 108 (90 Base) MCG/ACT inhaler Inhale 2 puffs into the lungs every 6 (six) hours as needed for wheezing or shortness of breath. 12/19/18   [name], NP  amLODipine (NORVASC) 10 MG tablet Take 1 tablet (10 mg total) by mouth daily. . 08/03/19 11/01/19  [name], NP  Blood Pressure Monitor DEVI Please provide patient with insurance approved blood pressure monitor 12/24/18   [name], NP  busPIRone (BUSPAR) 5 MG tablet Take 2 tablets (10 mg total) by mouth 3 (three) times daily as needed. 08/03/19   [name], NP  etonogestrel-ethinyl estradiol (NUVARING) 0.12-0.015 MG/24HR vaginal ring Insert vaginally and leave in place for 3 consecutive weeks, then remove for 1 week. 08/03/19   [name], NP  fluconazole  (DIFLUCAN) 150 MG tablet Take 1 tablet (150 mg total) by mouth once for 1 dose. 10/25/19 10/25/19  Argentina Donovan, PA-C  lisinopril-hydrochlorothiazide (ZESTORETIC) 10-12.5 MG tablet Take 1 tablet by mouth daily. 08/03/19   [name], NP  Misc. Devices MISC Please provide patient with insurance approved blood pressure monitor 12/24/18   [name], NP  nitrofurantoin, macrocrystal-monohydrate, (MACROBID) 100 MG capsule Take 1 capsule (100 mg total) by mouth 2 (two) times daily. 10/25/19   Argentina Donovan, PA-C  omega-3 acid ethyl esters (LOVAZA) 1 g capsule Take 2 capsules (2 g total) by mouth 2 (two) times daily. 08/03/19 11/01/19  [name], NP  pravastatin (PRAVACHOL) 40 MG tablet Take 1 tablet (40 mg total) by mouth daily. 08/03/19 11/01/19  [name], NP  sitaGLIPtin-metformin (JANUMET) 50-1000 MG tablet Take 1 tablet by mouth 2 (two) times daily with a meal. 08/03/19 11/01/19  [name], NP     Objective:  EXAM:   Vitals:   10/25/19 0850  BP: 138/90  Pulse: (!) 111  SpO2: 100%  Weight: 200 lb (90.7 kg)  Height: 5\' 2"  (1.575 m)    General appearance : A&OX3. NAD. Non-toxic-appearing HEENT: Atraumatic and Normocephalic.  PERRLA. EOM intact.  Neck: supple, no JVD. No cervical lymphadenopathy. No thyromegaly Chest/Lungs:  Breathing-non-labored, Good air entry bilaterally, breath sounds normal without rales, rhonchi, or wheezing  CVS: S1 S2 regular, no murmurs, gallops, rubs  No CVA  TTP Extremities: Bilateral Lower Ext shows no edema, both legs are warm to touch with = pulse throughout Neurology:  CN II-XII grossly intact, Non focal.   Psych:  TP linear. J/I WNL. Normal speech. Appropriate eye contact and affect.  Skin:  No Rash  Data Review Lab Results  Component Value Date   HGBA1C 7.8 (A) 08/03/2019   HGBA1C 6.8 (A) 12/19/2018   HGBA1C 8.6 (A) 06/15/2018     Assessment & Plan   1. Type 2 diabetes mellitus with hyperglycemia, without long-term  current use of insulin (HCC) Uncontrolled-refused novolog.  Increase water intake.  Work on diabetic diet.  Take meds as directed - Glucose (CBG)  2. Acute cystitis with hematuria Increase water to 80-100 ounces daily. - POCT URINALYSIS DIP (CLINITEK) - Urine Culture - nitrofurantoin, macrocrystal-monohydrate, (MACROBID) 100 MG capsule; Take 1 capsule (100 mg total) by mouth 2 (two) times daily.  Dispense: 14 capsule; Refill: 0 - fluconazole (DIFLUCAN) 150 MG tablet; Take 1 tablet (150 mg total) by mouth once for 1 dose.  Dispense: 1 tablet; Refill: 0   Patient have been counseled extensively about nutrition and exercise  Return for 6/21 appt with PCP.  The patient was given clear instructions to go to ER or return to medical center if symptoms don't improve, worsen or new problems develop. The patient verbalized understanding. The patient was told to call to get lab results if they haven't heard anything in the next week.     Freeman Caldron, PA-C [facility] [place], [place]   10/25/2019, 9:05 AMPatient ID: Samantha [name], female   DOB: 1980-12-22, 39 y.o.   MRN: [id number]

## 2019-10-25 NOTE — Progress Notes (Signed)
Pain in back and stomach thinks

## 2019-10-25 NOTE — Patient Instructions (Signed)
Drink 80-100 ounces water daily   Urinary Tract Infection, Adult A urinary tract infection (UTI) is an infection of any part of the urinary tract. The urinary tract includes:  The kidneys.  The ureters.  The bladder.  The urethra. These organs make, store, and get rid of pee (urine) in the body. What are the causes? This is caused by germs (bacteria) in your genital area. These germs grow and cause swelling (inflammation) of your urinary tract. What increases the risk? You are more likely to develop this condition if:  You have a small, thin tube (catheter) to drain pee.  You cannot control when you pee or poop (incontinence).  You are female, and: ? You use these methods to prevent pregnancy:  A medicine that kills sperm (spermicide).  A device that blocks sperm (diaphragm). ? You have low levels of a female hormone (estrogen). ? You are pregnant.  You have genes that add to your risk.  You are sexually active.  You take antibiotic medicines.  You have trouble peeing because of: ? A prostate that is bigger than normal, if you are female. ? A blockage in the part of your body that drains pee from the bladder (urethra). ? A kidney stone. ? A nerve condition that affects your bladder (neurogenic bladder). ? Not getting enough to drink. ? Not peeing often enough.  You have other conditions, such as: ? Diabetes. ? A weak disease-fighting system (immune system). ? Sickle cell disease. ? Gout. ? Injury of the spine. What are the signs or symptoms? Symptoms of this condition include:  Needing to pee right away (urgently).  Peeing often.  Peeing small amounts often.  Pain or burning when peeing.  Blood in the pee.  Pee that smells bad or not like normal.  Trouble peeing.  Pee that is cloudy.  Fluid coming from the vagina, if you are female.  Pain in the belly or lower back. Other symptoms include:  Throwing up (vomiting).  No urge to eat.  Feeling  mixed up (confused).  Being tired and grouchy (irritable).  A fever.  Watery poop (diarrhea). How is this treated? This condition may be treated with:  Antibiotic medicine.  Other medicines.  Drinking enough water. Follow these instructions at home:  Medicines  Take over-the-counter and prescription medicines only as told by your doctor.  If you were prescribed an antibiotic medicine, take it as told by your doctor. Do not stop taking it even if you start to feel better. General instructions  Make sure you: ? Pee until your bladder is empty. ? Do not hold pee for a long time. ? Empty your bladder after sex. ? Wipe from front to back after pooping if you are a female. Use each tissue one time when you wipe.  Drink enough fluid to keep your pee pale yellow.  Keep all follow-up visits as told by your doctor. This is important. Contact a doctor if:  You do not get better after 1-2 days.  Your symptoms go away and then come back. Get help right away if:  You have very bad back pain.  You have very bad pain in your lower belly.  You have a fever.  You are sick to your stomach (nauseous).  You are throwing up. Summary  A urinary tract infection (UTI) is an infection of any part of the urinary tract.  This condition is caused by germs in your genital area.  There are many risk factors for a  UTI. These include having a small, thin tube to drain pee and not being able to control when you pee or poop.  Treatment includes antibiotic medicines for germs.  Drink enough fluid to keep your pee pale yellow. This information is not intended to replace advice given to you by your health care provider. Make sure you discuss any questions you have with your health care provider. Document Revised: 05/05/2018 Document Reviewed: 11/25/2017 Elsevier Patient Education  2020 [employer].

## 2019-10-27 LAB — URINE CULTURE

## 2019-10-31 ENCOUNTER — Telehealth: Payer: Self-pay

## 2019-10-31 NOTE — Telephone Encounter (Signed)
Pt called confirming she is taking the antibiotic and drinking as much water as she can daily.

## 2019-11-20 ENCOUNTER — Other Ambulatory Visit: Payer: Self-pay

## 2019-11-20 ENCOUNTER — Encounter: Payer: Self-pay | Admitting: Nurse Practitioner

## 2019-11-20 ENCOUNTER — Other Ambulatory Visit: Payer: Self-pay | Admitting: Nurse Practitioner

## 2019-11-20 ENCOUNTER — Ambulatory Visit: Payer: Medicaid Other | Attending: Nurse Practitioner | Admitting: Nurse Practitioner

## 2019-11-20 VITALS — BP 119/77 | HR 109 | Temp 97.7°F | Ht 62.0 in | Wt 203.0 lb

## 2019-11-20 DIAGNOSIS — E669 Obesity, unspecified: Secondary | ICD-10-CM

## 2019-11-20 DIAGNOSIS — E785 Hyperlipidemia, unspecified: Secondary | ICD-10-CM | POA: Insufficient documentation

## 2019-11-20 DIAGNOSIS — Z7984 Long term (current) use of oral hypoglycemic drugs: Secondary | ICD-10-CM | POA: Diagnosis not present

## 2019-11-20 DIAGNOSIS — E1165 Type 2 diabetes mellitus with hyperglycemia: Secondary | ICD-10-CM | POA: Diagnosis not present

## 2019-11-20 DIAGNOSIS — Z79899 Other long term (current) drug therapy: Secondary | ICD-10-CM | POA: Insufficient documentation

## 2019-11-20 DIAGNOSIS — Z3040 Encounter for surveillance of contraceptives, unspecified: Secondary | ICD-10-CM

## 2019-11-20 DIAGNOSIS — F17200 Nicotine dependence, unspecified, uncomplicated: Secondary | ICD-10-CM

## 2019-11-20 DIAGNOSIS — I10 Essential (primary) hypertension: Secondary | ICD-10-CM

## 2019-11-20 DIAGNOSIS — E781 Pure hyperglyceridemia: Secondary | ICD-10-CM | POA: Diagnosis not present

## 2019-11-20 DIAGNOSIS — E119 Type 2 diabetes mellitus without complications: Secondary | ICD-10-CM | POA: Diagnosis present

## 2019-11-20 DIAGNOSIS — F419 Anxiety disorder, unspecified: Secondary | ICD-10-CM

## 2019-11-20 LAB — POCT GLYCOSYLATED HEMOGLOBIN (HGB A1C): Hemoglobin A1C: 7.9 % — AB (ref 4.0–5.6)

## 2019-11-20 LAB — GLUCOSE, POCT (MANUAL RESULT ENTRY): POC Glucose: 219 mg/dl — AB (ref 70–99)

## 2019-11-20 MED ORDER — JANUMET 50-1000 MG PO TABS: 1.0000 | ORAL_TABLET | Freq: Two times a day (BID) | ORAL | 2 refills | Status: DC

## 2019-11-20 MED ORDER — ETONOGESTREL-ETHINYL ESTRADIOL 0.12-0.015 MG/24HR VA RING: VAGINAL_RING | VAGINAL | 6 refills | Status: DC

## 2019-11-20 MED ORDER — LISINOPRIL-HYDROCHLOROTHIAZIDE 10-12.5 MG PO TABS: 1.0000 | ORAL_TABLET | Freq: Every day | ORAL | 1 refills | Status: DC

## 2019-11-20 MED ORDER — AMLODIPINE BESYLATE 10 MG PO TABS: 10.0000 mg | ORAL_TABLET | Freq: Every day | ORAL | 1 refills | Status: DC

## 2019-11-20 MED ORDER — PRAVASTATIN SODIUM 40 MG PO TABS: 40.0000 mg | ORAL_TABLET | Freq: Every day | ORAL | 2 refills | Status: DC

## 2019-11-20 MED ORDER — BLOOD GLUCOSE METER KIT: PACK | 0 refills | Status: DC

## 2019-11-20 MED ORDER — BUSPIRONE HCL 5 MG PO TABS: 10.0000 mg | ORAL_TABLET | Freq: Three times a day (TID) | ORAL | 1 refills | Status: DC | PRN

## 2019-11-20 MED ORDER — ALBUTEROL SULFATE HFA 108 (90 BASE) MCG/ACT IN AERS: 2.0000 | INHALATION_SPRAY | Freq: Four times a day (QID) | RESPIRATORY_TRACT | 2 refills | Status: DC | PRN

## 2019-11-20 MED ORDER — OMEGA-3-ACID ETHYL ESTERS 1 G PO CAPS: 2.0000 g | ORAL_CAPSULE | Freq: Two times a day (BID) | ORAL | 1 refills | Status: DC

## 2019-11-20 NOTE — Progress Notes (Signed)
Assessment & Plan:  Samantha [name] was seen today for diabetes.  Diagnoses and all orders for this visit:  Type 2 diabetes mellitus with hyperglycemia, without long-term current use of insulin (HCC) -     Glucose (CBG) -     HgB A1c -     Discontinue: sitaGLIPtin-metformin (JANUMET) 50-1000 MG tablet; Take 1 tablet by mouth 2 (two) times daily with a meal. -     Discontinue: pravastatin (PRAVACHOL) 40 MG tablet; Take 1 tablet (40 mg total) by mouth daily. -     Amb Referral to Bariatric Surgery -     pravastatin (PRAVACHOL) 40 MG tablet; Take 1 tablet (40 mg total) by mouth daily. -     sitaGLIPtin-metformin (JANUMET) 50-1000 MG tablet; Take 1 tablet by mouth 2 (two) times daily with a meal. Continue blood sugar control as discussed in office today, low carbohydrate diet, and regular physical exercise as tolerated, 150 minutes per week (30 min each day, 5 days per week, or 50 min 3 days per week). Keep blood sugar logs with fasting goal of 90-130 mg/dl, post prandial (after you eat) less than 180.  For Hypoglycemia: BS <60 and Hyperglycemia BS >400; contact the clinic ASAP. Annual eye exams and foot exams are recommended.   Hypertriglyceridemia -     pravastatin (PRAVACHOL) 40 MG tablet; Take 1 tablet (40 mg total) by mouth daily. -     omega-3 acid ethyl esters (LOVAZA) 1 g capsule; Take 2 capsules (2 g total) by mouth 2 (two) times daily. INSTRUCTIONS: Work on a low fat, heart healthy diet and participate in regular aerobic exercise program by working out at least 150 minutes per week; 5 days a week-30 minutes per day. Avoid red meat/beef/steak,  fried foods. junk foods, sodas, sugary drinks, unhealthy snacking, alcohol and smoking.  Drink at least 80 oz of water per day and monitor your carbohydrate intake daily.   Essential hypertension . -     lisinopril-hydrochlorothiazide (ZESTORETIC) 10-12.5 MG tablet; Take 1 tablet by mouth daily. -     amLODipine (NORVASC) 10 MG tablet; Take 1 tablet  (10 mg total) by mouth daily.  Continue all antihypertensives as prescribed.  Remember to bring in your blood pressure log with you for your follow up appointment.  DASH/Mediterranean Diets are healthier choices for HTN.    Obesity (BMI 30-39.9) -     Amb Referral to Bariatric Surgery Discussed diet and exercise for person with BMI >25. Instructed: You must burn more calories than you eat. Losing 5 percent of your body weight should be considered a success. In the longer term, losing more than 15 percent of your body weight and staying at this weight is an extremely good result. However, keep in mind that even losing 5 percent of your body weight leads to important health benefits, so try not to get discouraged if you're not able to lose more than this. Will recheck weight in 3-6 months.  Tobacco dependence -     albuterol (VENTOLIN HFA) 108 (90 Base) MCG/ACT inhaler; Inhale 2 puffs into the lungs every 6 (six) hours as needed for wheezing or shortness of breath. Not quite ready to quit.  Anxiety She has not picked up her buspar yet.  -     busPIRone (BUSPAR) 5 MG tablet; Take 2 tablets (10 mg total) by mouth 3 (three) times daily as needed.  Encounter for refill of prescription for contraception -     etonogestrel-ethinyl estradiol (NUVARING) 0.12-0.015 MG/24HR vaginal  ring; Insert vaginally and leave in place for 3 consecutive weeks, then remove for 1 week.    Patient has been counseled on age-appropriate routine health concerns for screening and prevention. These are reviewed and up-to-date. Referrals have been placed accordingly. Immunizations are up-to-date or declined.    Subjective:   Chief Complaint  Patient presents with  . Diabetes    Pt. is here for diabetes follow up.    Diabetes Hypoglycemia symptoms include nervousness/anxiousness. Pertinent negatives for hypoglycemia include no dizziness, headaches or seizures. Pertinent negatives for diabetes include no blurred vision,  no chest pain and no weight loss.   Samantha [name] 39 y.o. female presents to office today for follow up.   DM TYPE 2 Not well controlled. Weight is up. Encouraged weight loss and stricter dietary modifications. She wil lcontinue on Janumet 50-1000 mg daily. Has not been monitoring her blood glucose levels as she has been out of supplies for monitor. Will refill today and send new meter covered by insurance. Blood pressure is well controlled on amlodipine 10 mg daily and zestoretic 10-12.5 mg daily. LDL not at goal. She is taking pravastatin 40 mg daily and lovaza 2 gm BID. Denies statin intolerance. Denies chest pain, shortness of breath, palpitations, lightheadedness, dizziness, headaches or BLE edema.  Lab Results  Component Value Date   HGBA1C 7.9 (A) 11/20/2019   Lab Results  Component Value Date   HGBA1C 7.8 (A) 08/03/2019   Lab Results  Component Value Date   LDLCALC 86 08/03/2019  Recommended she avoid caffeine products which can cause tachycardia.  BP Readings from Last 3 Encounters:  11/20/19 119/77  10/25/19 138/90  08/23/19 (!) 141/98    Obesity  She is interested in possible gastric bypass surgery. Will refer to the bariatric clinic for further evaluation and recommendations for weight management.     Review of Systems  Constitutional: Negative for fever, malaise/fatigue and weight loss.  HENT: Negative.  Negative for nosebleeds.   Eyes: Negative.  Negative for blurred vision, double vision and photophobia.  Respiratory: Negative.  Negative for cough and shortness of breath.   Cardiovascular: Negative.  Negative for chest pain, palpitations and leg swelling.  Gastrointestinal: Negative.  Negative for heartburn, nausea and vomiting.  Musculoskeletal: Negative.  Negative for myalgias.  Neurological: Negative.  Negative for dizziness, focal weakness, seizures and headaches.  Psychiatric/Behavioral: Negative for suicidal ideas. The patient is nervous/anxious.      Past Medical History:  Diagnosis Date  . Diabetes mellitus without complication ([place])   . Hyperlipidemia   . Hypertension     History reviewed. No pertinent surgical history.  Family History  Problem Relation Age of Onset  . Cancer Father     Social History Reviewed with no changes to be made today.   Outpatient Medications Prior to Visit  Medication Sig Dispense Refill  . Blood Pressure Monitor DEVI Please provide patient with insurance approved blood pressure monitor 1 Device 0  . Misc. Devices MISC Please provide patient with insurance approved blood pressure monitor 1 each 0  . albuterol (VENTOLIN HFA) 108 (90 Base) MCG/ACT inhaler Inhale 2 puffs into the lungs every 6 (six) hours as needed for wheezing or shortness of breath. 6.7 g 2  . etonogestrel-ethinyl estradiol (NUVARING) 0.12-0.015 MG/24HR vaginal ring Insert vaginally and leave in place for 3 consecutive weeks, then remove for 1 week. 1 each 6  . lisinopril-hydrochlorothiazide (ZESTORETIC) 10-12.5 MG tablet Take 1 tablet by mouth daily. 90 tablet 1  . nitrofurantoin,  macrocrystal-monohydrate, (MACROBID) 100 MG capsule Take 1 capsule (100 mg total) by mouth 2 (two) times daily. 14 capsule 0  . amLODipine (NORVASC) 10 MG tablet Take 1 tablet (10 mg total) by mouth daily. . 90 tablet 1  . busPIRone (BUSPAR) 5 MG tablet Take 2 tablets (10 mg total) by mouth 3 (three) times daily as needed. (Patient not taking: Reported on 11/20/2019) 60 tablet 1  . omega-3 acid ethyl esters (LOVAZA) 1 g capsule Take 2 capsules (2 g total) by mouth 2 (two) times daily. 360 capsule 1  . pravastatin (PRAVACHOL) 40 MG tablet Take 1 tablet (40 mg total) by mouth daily. 90 tablet 2  . sitaGLIPtin-metformin (JANUMET) 50-1000 MG tablet Take 1 tablet by mouth 2 (two) times daily with a meal. 180 tablet 2   No facility-administered medications prior to visit.    No Known Allergies     Objective:    BP 119/77 (BP Location: Left Arm, Patient  Position: Sitting, Cuff Size: Large)   Pulse (!) 109   Temp 97.7 F (36.5 C) (Temporal)   Ht 5\' 2"  (1.575 m)   Wt 203 lb (92.1 kg)   SpO2 99%   BMI 37.13 kg/m  Wt Readings from Last 3 Encounters:  11/20/19 203 lb (92.1 kg)  10/25/19 200 lb (90.7 kg)  08/23/19 203 lb 12.8 oz (92.4 kg)    Physical Exam Vitals and nursing note reviewed.  Constitutional:      Appearance: She is well-developed.  HENT:     Head: Normocephalic and atraumatic.  Cardiovascular:     Rate and Rhythm: Regular rhythm. Tachycardia present.     Heart sounds: Normal heart sounds. No murmur heard.  No friction rub. No gallop.   Pulmonary:     Effort: Pulmonary effort is normal. No tachypnea or respiratory distress.     Breath sounds: Normal breath sounds. No decreased breath sounds, wheezing, rhonchi or rales.  Chest:     Chest wall: No tenderness.  Abdominal:     General: Bowel sounds are normal.     Palpations: Abdomen is soft.  Musculoskeletal:        General: Normal range of motion.     Cervical back: Normal range of motion.  Skin:    General: Skin is warm and dry.  Neurological:     Mental Status: She is alert and oriented to person, place, and time.     Coordination: Coordination normal.  Psychiatric:        Behavior: Behavior normal. Behavior is cooperative.        Thought Content: Thought content normal.        Judgment: Judgment normal.          Patient has been counseled extensively about nutrition and exercise as well as the importance of adherence with medications and regular follow-up. The patient was given clear instructions to go to ER or return to medical center if symptoms don't improve, worsen or new problems develop. The patient verbalized understanding.   Follow-up: Return in about 3 months (around 02/20/2020).   [name], FNP-BC [facility], [place]   11/20/2019, 7:37 PM

## 2019-11-21 LAB — BASIC METABOLIC PANEL
BUN/Creatinine Ratio: 14 (ref 9–23)
BUN: 9 mg/dL (ref 6–20)
CO2: 20 mmol/L (ref 20–29)
Calcium: 9.6 mg/dL (ref 8.7–10.2)
Chloride: 99 mmol/L (ref 96–106)
Creatinine, Ser: 0.66 mg/dL (ref 0.57–1.00)
GFR calc Af Amer: 129 mL/min/{1.73_m2} (ref >59)
GFR calc non Af Amer: 112 mL/min/{1.73_m2} (ref >59)
Glucose: 223 mg/dL — ABNORMAL HIGH (ref 65–99)
Potassium: 3.7 mmol/L (ref 3.5–5.2)
Sodium: 136 mmol/L (ref 134–144)

## 2019-11-25 ENCOUNTER — Encounter: Payer: Self-pay | Admitting: Nurse Practitioner

## 2019-11-27 ENCOUNTER — Encounter: Payer: Self-pay | Admitting: Nurse Practitioner

## 2019-12-01 ENCOUNTER — Other Ambulatory Visit: Payer: Self-pay | Admitting: Nurse Practitioner

## 2019-12-01 ENCOUNTER — Telehealth: Payer: Self-pay

## 2019-12-01 DIAGNOSIS — M545 Low back pain, unspecified: Secondary | ICD-10-CM

## 2019-12-01 NOTE — Telephone Encounter (Signed)
Pt's Lovaza is not covered under her Medicaid--her triglycerides have to be greater than 500mg /dl

## 2019-12-07 NOTE — Telephone Encounter (Signed)
Please let her know she can take over the counter fish oil 2 capsules per day

## 2019-12-08 NOTE — Telephone Encounter (Signed)
Spoke to patient and informed on Rx replacement. Pt. Understood.

## 2019-12-18 ENCOUNTER — Ambulatory Visit: Payer: Medicaid Other | Admitting: Physical Therapy

## 2019-12-26 ENCOUNTER — Ambulatory Visit: Payer: Medicaid Other | Attending: Nurse Practitioner

## 2019-12-26 ENCOUNTER — Other Ambulatory Visit: Payer: Self-pay

## 2019-12-26 DIAGNOSIS — M545 Low back pain: Secondary | ICD-10-CM | POA: Diagnosis not present

## 2019-12-26 DIAGNOSIS — M6281 Muscle weakness (generalized): Secondary | ICD-10-CM | POA: Diagnosis not present

## 2019-12-26 DIAGNOSIS — G8929 Other chronic pain: Secondary | ICD-10-CM | POA: Insufficient documentation

## 2019-12-26 NOTE — Therapy (Signed)
[facility] [place], [place], [address] Phone: [phone]   Fax:  [phone]  Physical Therapy Evaluation  Patient Details  Name: Samantha [name] MRN: [id number] Date of Birth: 03/04/1981 Referring Provider (PT): Geryl Rankins, NP   Encounter Date: 12/26/2019   PT End of Session - 12/26/19 1657    Visit Number 1    Number of Visits 4    Date for PT Re-Evaluation 02/20/20    Authorization Type Medicaid    PT Start Time 7408    PT Stop Time 1620    PT Time Calculation (min) 50 min    Activity Tolerance Patient tolerated treatment well    Behavior During Therapy [facility] for tasks assessed/performed           Past Medical History:  Diagnosis Date  . Diabetes mellitus without complication ([place])   . Hyperlipidemia   . Hypertension     History reviewed. No pertinent surgical history.  There were no vitals filed for this visit.    Subjective Assessment - 12/26/19 1533    Subjective Pt reports constant low back pain with some infrequent, sharp L inner thigh pain to the point where pt has to stop and stand when walking.Back pain has progressively worsened to the point where it started waking her up out of sleep a couple of months ago. Pt notes a knot in her left upper buttock that she has her kids press on it and it helps. Intractable night pain is present. When pt was 54-15 yo, she would help her dad lift stuff. It got a lot worse when she had her kid 12 years ago, it worsened. 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    Limitations Sitting;Lifting;House hold activities    How long can you sit comfortably? unsure    How long can you stand comfortably? about an hour    How long can you walk comfortably? can continue walking, but L leg feels weaker and starts lagging    Patient Stated Goals Decrease pain to make it tolerable, reasonable HEP for in the house    Currently in Pain? Yes    Pain Score 5       Pain Location Back    Pain Orientation Left;Lower    Pain Descriptors / Indicators Aching   aching from the inside   Pain Type Chronic pain    Pain Radiating Towards L inner thigh/groin    Pain Onset More than a month ago    Pain Frequency Constant    Aggravating Factors  sitting    Pain Relieving Factors putting pressure on it, Aleve              [facility] PT Assessment - 12/26/19 0001      Assessment   Medical Diagnosis LBP with L-sided sciatica    Referring Provider (PT) Geryl Rankins, NP    Onset Date/Surgical Date 10/26/19    Hand Dominance Right    Next MD Visit PRN    Prior Therapy none       Balance Screen   Has the patient fallen in the past 6 months No    Has the patient had a decrease in activity level because of a fear of falling?  Yes    Is the patient reluctant to leave their home because of a fear of falling?  No      Prior Function   Level of Independence Independent  Vocation Full time employment    Leisure Reading, gardening      ROM / Strength   AROM / PROM / Strength Strength      Strength   Strength Assessment Site Hip    Right/Left Hip Right;Left    Right Hip Flexion 4/5    Right Hip Extension 3+/5    Right Hip ABduction 3+/5    Left Hip Flexion 4/5    Left Hip Extension 4-/5    Left Hip ABduction 4/5      Flexibility   Soft Tissue Assessment /Muscle Length yes    Hamstrings WFL    Quadriceps L>R shortening    Piriformis tightness restricting B hip ER      Palpation   Spinal mobility lumbar WFL, thoracic hypomobile    Palpation comment TTP L PSIS      Special Tests    Special Tests Lumbar    Lumbar Tests Slump Test;Prone Knee Bend Test;Straight Leg Raise      Slump test   Findings Negative      Prone Knee Bend Test   Findings Negative      Straight Leg Raise   Findings Negative                      Objective measurements completed on examination: See above findings.                 PT Short  Term Goals - 12/26/19 1724      PT SHORT TERM GOAL #1   Title Pt will be I and compliant with initial HEP.    Time 1    Period Weeks    Status New    Target Date 01/02/20      PT SHORT TERM GOAL #2   Title Pt will increase B hip MMT to 4/5.    Time 3    Period Weeks    Status New    Target Date 01/16/20      PT SHORT TERM GOAL #3   Title Pt will increase lumbar extension to 15 or greater.    Baseline 10    Time 3    Period Weeks    Status New    Target Date 01/16/20             PT Long Term Goals - 12/26/19 1725      PT LONG TERM GOAL #1   Title Pt will have no c/o LLE pain with any functional mobility.    Time 4    Period Weeks    Status New    Target Date 01/23/20      PT LONG TERM GOAL #2   Title Pt will reduce LBP to </=2/10 at worst.    Time 6    Period Weeks    Status New    Target Date 02/06/20      PT LONG TERM GOAL #3   Title Pt will demonstrate pain free lumbar AROM WFL.    Time 8    Period Weeks    Status New    Target Date 02/20/20      PT LONG TERM GOAL #4   Title Pt will be able to walk for at least 30 minutes at a time without c/o LBP pain, in order to establish a regular walking program.    Time 8    Period Weeks    Status New    Target Date 02/20/20  Plan - 12/26/19 1658    Clinical Impression Statement Pt is a 39 yo female with chronic LBP that has worsened over the last couple of months and become more constant the last 2 weeks. Pt has pinpoint pain over her L PSIS with some sharp pain intermittently to L inner thigh/groin. Pt demonstrates impairments in lumbar extension, B LE strength, L quad length, and thoracic hypomobility. She was educated on diagnosis, prognosis, POC, and HEP verbalizing understanding and consent to treatment. Pt will benefit from skilled physical therapy 2x/week for 8 weeks to address impairments and restore PLOF.    Personal Factors and Comorbidities Comorbidity 1;Comorbidity  3+;Comorbidity 2;Profession;Past/Current Experience;Fitness;Time since onset of injury/illness/exacerbation    Comorbidities HLD, HTN, DM 2    Examination-Activity Limitations Lift;Locomotion Level;Stand;Squat;Sleep    Examination-Participation Restrictions Driving;Community Activity;Shop;School    Stability/Clinical Decision Making Stable/Uncomplicated    Clinical Decision Making Low    Rehab Potential Good    PT Frequency 2x / week   1x/week 1st 3 weeks per Medicaid auth   PT Duration 8 weeks    PT Treatment/Interventions ADLs/Self Care Home Management;Moist Heat;Iontophoresis 4mg /ml Dexamethasone;Cryotherapy;Traction;Therapeutic activities;Functional mobility training;Therapeutic exercise;Balance training;Neuromuscular re-education;Spinal Manipulations;Joint Manipulations;Dry needling;Passive range of motion;Manual techniques;Patient/family education    PT Next Visit Plan Review HEP, progress mobility/flexibility, initiate core/hip stab    PT Home Exercise Plan [email or web address]: prone press ups, book openers, figure 4 S    Consulted and Agree with Plan of Care Patient           Patient will benefit from skilled therapeutic intervention in order to improve the following deficits and impairments:  Decreased activity tolerance, Pain, Postural dysfunction, Impaired flexibility, Increased fascial restricitons, Decreased strength, Hypomobility, Difficulty walking, Impaired perceived functional ability, Improper body mechanics, Obesity  Visit Diagnosis: Chronic left-sided low back pain, unspecified whether sciatica present - Plan: PT plan of care cert/re-cert  Muscle weakness (generalized) - Plan: PT plan of care cert/re-cert     Problem List Patient Active Problem List   Diagnosis Date Noted  . Hyperlipidemia associated with type 2 diabetes mellitus ([place]) 04/26/2017  . Type 2 diabetes mellitus with hyperglycemia ([place]) 11/12/2015  . Essential hypertension 11/04/2014   Check all possible CPT  codes:      [x]  97110 (Therapeutic Exercise)  []  92507 (SLP Treatment)  [x]  97112 (Neuro Re-ed)   []  92526 (Swallowing Treatment)   []  97116 (Gait Training)   []  [phone] (Cognitive Training, 1st 15 minutes) [x]  97140 (Manual Therapy)   []  97130 (Cognitive Training, each add'l 15 minutes)  [x]  97530 (Therapeutic Activities)  []  Other, List CPT Code ____________    [x]  59935 (Self Care)       []  All codes above (97110 - 97535)  [x]  97012 (Mechanical Traction)  []  97014 (E-stim Unattended)  []  97032 (E-stim manual)  [x]  97033 (Ionto)  []  97035 (Ultrasound)  []  97016 (Vaso)  []  97760 (Orthotic Fit) []  [id number] (Prosthetic Training) []  [id number] (Physical Performance Training) []  [phone] (Aquatic Therapy) []  [id number] (Canalith Repositioning) []  [id number] (Contrast Bath) []  [id number] (Paraffin) []  97597 (Wound Care 1st 20 sq cm) []  97598 (Wound Care each add'l 20 sq cm)      Izell East Gillespie, PT, DPT 12/26/2019, 5:28 PM  [facility] [address] [place], [place], [address] Phone: [phone]   Fax:  [phone]  Name: Samantha [name] MRN: [id number] Date of Birth: 01/11/1981

## 2020-01-05 ENCOUNTER — Telehealth: Payer: Self-pay

## 2020-01-05 ENCOUNTER — Ambulatory Visit: Payer: Medicaid Other | Attending: Nurse Practitioner

## 2020-01-05 NOTE — Telephone Encounter (Signed)
Left message re: no show. Reminded pt of attendence policy and upcoming appt on 01/10/20 at 11:45.

## 2020-01-10 ENCOUNTER — Ambulatory Visit: Payer: Medicaid Other | Admitting: Physical Therapy

## 2020-01-12 ENCOUNTER — Ambulatory Visit: Payer: Medicaid Other

## 2020-02-23 ENCOUNTER — Other Ambulatory Visit: Payer: Self-pay | Admitting: Hematology and Oncology

## 2020-02-23 ENCOUNTER — Inpatient Hospital Stay: Payer: Medicaid Other

## 2020-02-23 ENCOUNTER — Inpatient Hospital Stay: Payer: Medicaid Other | Attending: Hematology and Oncology | Admitting: Hematology and Oncology

## 2020-02-23 DIAGNOSIS — D72825 Bandemia: Secondary | ICD-10-CM

## 2020-02-23 NOTE — Progress Notes (Deleted)
[place] [phone]   [phone]  PROGRESS NOTE  Patient Care Team: [name], NP as PCP - General (Nurse Practitioner)  Hematological/Oncological History # ***  Interval History:  Samantha [name] 39 y.o. female with medical history significant for *** presents for a follow up visit. The patient's last visit was on ***. In the interim since the last visit ***  MEDICAL HISTORY:  Past Medical History:  Diagnosis Date  . Diabetes mellitus without complication ([place])   . Hyperlipidemia   . Hypertension     SURGICAL HISTORY: No past surgical history on file.  SOCIAL HISTORY: Social History   Socioeconomic History  . Marital status: Single    Spouse name: Not on file  . Number of children: Not on file  . Years of education: Not on file  . Highest education level: Not on file  Occupational History  . Not on file  Tobacco Use  . Smoking status: Current Every Day Smoker    Packs/day: 0.50    Years: 0.40    Pack years: 0.20    Types: Cigarettes  . Smokeless tobacco: Never Used  Vaping Use  . Vaping Use: Never used  Substance and Sexual Activity  . Alcohol use: No  . Drug use: No  . Sexual activity: Yes    Birth control/protection: None  Other Topics Concern  . Not on file  Social History Narrative  . Not on file   Social Determinants of Health   Financial Resource Strain:   . Difficulty of Paying Living Expenses: Not on file  Food Insecurity:   . Worried About Charity fundraiser in the Last Year: Not on file  . Ran Out of Food in the Last Year: Not on file  Transportation Needs:   . Lack of Transportation (Medical): Not on file  . Lack of Transportation (Non-Medical): Not on file  Physical Activity:   . Days of Exercise per Week: Not on file  . Minutes of Exercise per Session: Not on file  Stress:   . Feeling of Stress : Not on file  Social Connections:   . Frequency of Communication with Friends and Family:  Not on file  . Frequency of Social Gatherings with Friends and Family: Not on file  . Attends Religious Services: Not on file  . Active Member of Clubs or Organizations: Not on file  . Attends Archivist Meetings: Not on file  . Marital Status: Not on file  Intimate Partner Violence:   . Fear of Current or Ex-Partner: Not on file  . Emotionally Abused: Not on file  . Physically Abused: Not on file  . Sexually Abused: Not on file    FAMILY HISTORY: Family History  Problem Relation Age of Onset  . Cancer Father     ALLERGIES:  has No Known Allergies.  MEDICATIONS:  Current Outpatient Medications  Medication Sig Dispense Refill  . albuterol (VENTOLIN HFA) 108 (90 Base) MCG/ACT inhaler Inhale 2 puffs into the lungs every 6 (six) hours as needed for wheezing or shortness of breath. 6.7 g 2  . amLODipine (NORVASC) 10 MG tablet Take 1 tablet (10 mg total) by mouth daily. . 90 tablet 1  . blood glucose meter kit and supplies Dispense based on patient and insurance preference. Use up to two times daily as directed. ICD-10 E11.65. (Patient not taking: Reported on 12/26/2019) 1 each 0  . Blood Pressure Monitor DEVI Please provide patient with insurance approved blood  pressure monitor (Patient not taking: Reported on 12/26/2019) 1 Device 0  . busPIRone (BUSPAR) 5 MG tablet Take 2 tablets (10 mg total) by mouth 3 (three) times daily as needed. 60 tablet 1  . etonogestrel-ethinyl estradiol (NUVARING) 0.12-0.015 MG/24HR vaginal ring Insert vaginally and leave in place for 3 consecutive weeks, then remove for 1 week. 1 each 6  . lisinopril-hydrochlorothiazide (ZESTORETIC) 10-12.5 MG tablet Take 1 tablet by mouth daily. 90 tablet 1  . Misc. Devices MISC Please provide patient with insurance approved blood pressure monitor (Patient not taking: Reported on 12/26/2019) 1 each 0  . omega-3 acid ethyl esters (LOVAZA) 1 g capsule Take 2 capsules (2 g total) by mouth 2 (two) times daily. (Patient not  taking: Reported on 12/26/2019) 360 capsule 1  . pravastatin (PRAVACHOL) 40 MG tablet Take 1 tablet (40 mg total) by mouth daily. 90 tablet 2  . sitaGLIPtin-metformin (JANUMET) 50-1000 MG tablet Take 1 tablet by mouth 2 (two) times daily with a meal. 180 tablet 2   No current facility-administered medications for this visit.    REVIEW OF SYSTEMS:   Constitutional: ( - ) fevers, ( - )  chills , ( - ) night sweats Eyes: ( - ) blurriness of vision, ( - ) double vision, ( - ) watery eyes Ears, nose, mouth, throat, and face: ( - ) mucositis, ( - ) sore throat Respiratory: ( - ) cough, ( - ) dyspnea, ( - ) wheezes Cardiovascular: ( - ) palpitation, ( - ) chest discomfort, ( - ) lower extremity swelling Gastrointestinal:  ( - ) nausea, ( - ) heartburn, ( - ) change in bowel habits Skin: ( - ) abnormal skin rashes Lymphatics: ( - ) new lymphadenopathy, ( - ) easy bruising Neurological: ( - ) numbness, ( - ) tingling, ( - ) new weaknesses Behavioral/Psych: ( - ) mood change, ( - ) new changes  All other systems were reviewed with the patient and are negative.  PHYSICAL EXAMINATION: ECOG PERFORMANCE STATUS: {CHL ONC ECOG [phone]}  There were no vitals filed for this visit. There were no vitals filed for this visit.  GENERAL: alert, no distress and comfortable SKIN: skin color, texture, turgor are normal, no rashes or significant lesions EYES: conjunctiva are pink and non-injected, sclera clear OROPHARYNX: no exudate, no erythema; lips, buccal mucosa, and tongue normal  NECK: supple, non-tender LYMPH:  no palpable lymphadenopathy in the cervical, axillary or inguinal LUNGS: clear to auscultation and percussion with normal breathing effort HEART: regular rate & rhythm and no murmurs and no lower extremity edema ABDOMEN: soft, non-tender, non-distended, normal bowel sounds Musculoskeletal: no cyanosis of digits and no clubbing  PSYCH: alert & oriented x 3, fluent speech NEURO: no focal  motor/sensory deficits  LABORATORY DATA:  I have reviewed the data as listed CBC Latest Ref Rng & Units 08/23/2019 08/03/2019 12/19/2018  WBC 4.0 - 10.5 K/uL 14.8(H) 18.1(H) 14.7(H)  Hemoglobin 12.0 - 15.0 g/dL 13.3 14.3 14.0  Hematocrit 36 - 46 % 39.6 41.7 44.1  Platelets 150 - 400 K/uL 342 384 389    CMP Latest Ref Rng & Units 11/20/2019 08/23/2019 08/03/2019  Glucose 65 - 99 mg/dL 223(H) 195(H) 167(H)  BUN 6 - 20 mg/dL _0 Creatinine 0.57 - 1.00 mg/dL 0.66 0.74 0.73  Sodium 134 - 144 mmol/L 136 141 138  Potassium 3.5 - 5.2 mmol/L 3.7 4.0 4.2  Chloride 96 - 106 mmol/L 99 106 99  CO2 20 - 29  mmol/L _0 Calcium 8.7 - 10.2 mg/dL 9.6 9.5 10.1  Total Protein 6.5 - 8.1 g/dL - 7.1 7.2  Total Bilirubin 0.3 - 1.2 mg/dL - 0.3 0.2  Alkaline Phos 38 - 126 U/L - 46 54  AST 15 - 41 U/L - 16 15  ALT 0 - 44 U/L - 22 14    No results found for: MPROTEIN No results found for: KPAFRELGTCHN, LAMBDASER, KAPLAMBRATIO   BLOOD FILM: *** Review of the peripheral blood smear showed normal appearing white cells with neutrophils that were appropriately lobated and granulated. There was no predominance of bi-lobed or hyper-segmented neutrophils appreciated. No Dohle bodies were noted. There was no left shifting, immature forms or blasts noted. Lymphocytes remain normal in size without any predominance of large granular lymphocytes. Red cells show no anisopoikilocytosis, macrocytes , microcytes or polychromasia. There were no schistocytes, target cells, echinocytes, acanthocytes, dacrocytes, or stomatocytes.There was no rouleaux formation, nucleated red cells, or intra-cellular inclusions noted. The platelets are normal in size, shape, and color without any clumping evident.  RADIOGRAPHIC STUDIES: I have personally reviewed the radiological images as listed and agreed with the findings in the report. No results found.  ASSESSMENT & PLAN ***  No orders of the defined types were placed in this  encounter.   All questions were answered. The patient knows to call the clinic with any problems, questions or concerns.  A total of more than {CHL ONC TIME VISIT - ZZCKI:[id number]} were spent on this encounter and over half of that time was spent on counseling and coordination of care as outlined above.   Ledell Peoples, MD Department of Hematology/Oncology [facility] Phone: [phone] Pager: [phone] Email: [email or web address]  02/23/2020 7:29 AM

## 2020-02-27 ENCOUNTER — Other Ambulatory Visit: Payer: Self-pay | Admitting: Nurse Practitioner

## 2020-02-27 ENCOUNTER — Encounter: Payer: Self-pay | Admitting: Nurse Practitioner

## 2020-02-27 DIAGNOSIS — I10 Essential (primary) hypertension: Secondary | ICD-10-CM

## 2020-02-27 DIAGNOSIS — E1165 Type 2 diabetes mellitus with hyperglycemia: Secondary | ICD-10-CM

## 2020-02-27 DIAGNOSIS — E781 Pure hyperglyceridemia: Secondary | ICD-10-CM

## 2020-02-27 DIAGNOSIS — R399 Unspecified symptoms and signs involving the genitourinary system: Secondary | ICD-10-CM

## 2020-02-27 DIAGNOSIS — D72829 Elevated white blood cell count, unspecified: Secondary | ICD-10-CM

## 2020-02-28 ENCOUNTER — Other Ambulatory Visit: Payer: Self-pay

## 2020-02-28 ENCOUNTER — Other Ambulatory Visit: Payer: Medicaid Other

## 2020-02-28 ENCOUNTER — Ambulatory Visit: Payer: Medicaid Other | Attending: Nurse Practitioner

## 2020-02-28 ENCOUNTER — Other Ambulatory Visit (HOSPITAL_COMMUNITY)
Admission: RE | Admit: 2020-02-28 | Discharge: 2020-02-28 | Disposition: A | Discharge status: Home / Self Care | Payer: Medicaid Other | Source: Ambulatory Visit | Attending: Nurse Practitioner | Admitting: Nurse Practitioner

## 2020-02-28 DIAGNOSIS — R399 Unspecified symptoms and signs involving the genitourinary system: Secondary | ICD-10-CM

## 2020-02-28 DIAGNOSIS — D72829 Elevated white blood cell count, unspecified: Secondary | ICD-10-CM

## 2020-02-28 DIAGNOSIS — E781 Pure hyperglyceridemia: Secondary | ICD-10-CM | POA: Diagnosis not present

## 2020-02-28 DIAGNOSIS — I10 Essential (primary) hypertension: Secondary | ICD-10-CM

## 2020-02-28 DIAGNOSIS — E1165 Type 2 diabetes mellitus with hyperglycemia: Secondary | ICD-10-CM

## 2020-02-28 LAB — POCT URINALYSIS DIP (CLINITEK)
Bilirubin, UA: NEGATIVE
Blood, UA: NEGATIVE
Glucose, UA: >=1000 mg/dL — AB
Leukocytes, UA: NEGATIVE
Nitrite, UA: NEGATIVE
POC PROTEIN,UA: NEGATIVE
Spec Grav, UA: 1.025 (ref 1.010–1.025)
Urobilinogen, UA: 0.2 E.U./dL
pH, UA: 5 (ref 5.0–8.0)

## 2020-02-29 ENCOUNTER — Other Ambulatory Visit: Payer: Self-pay | Admitting: Nurse Practitioner

## 2020-02-29 LAB — LIPID PANEL
Chol/HDL Ratio: 4 ratio (ref 0.0–4.4)
Cholesterol, Total: 215 mg/dL — ABNORMAL HIGH (ref 100–199)
HDL: 54 mg/dL (ref >39)
LDL Chol Calc (NIH): 89 mg/dL (ref 0–99)
Triglycerides: 442 mg/dL — ABNORMAL HIGH (ref 0–149)
VLDL Cholesterol Cal: 72 mg/dL — ABNORMAL HIGH (ref 5–40)

## 2020-02-29 LAB — CERVICOVAGINAL ANCILLARY ONLY
Bacterial Vaginitis (gardnerella): POSITIVE — AB
Candida Glabrata: NEGATIVE
Candida Vaginitis: POSITIVE — AB
Chlamydia: NEGATIVE
Comment: NEGATIVE
Comment: NEGATIVE
Comment: NEGATIVE
Comment: NEGATIVE
Comment: NEGATIVE
Comment: NORMAL
Neisseria Gonorrhea: NEGATIVE
Trichomonas: NEGATIVE

## 2020-02-29 LAB — CMP14+EGFR
ALT: 20 IU/L (ref 0–32)
AST: 18 IU/L (ref 0–40)
Albumin/Globulin Ratio: 1.3 (ref 1.2–2.2)
Albumin: 4 g/dL (ref 3.8–4.8)
Alkaline Phosphatase: 62 IU/L (ref 44–121)
BUN/Creatinine Ratio: 11 (ref 9–23)
BUN: 8 mg/dL (ref 6–20)
Bilirubin Total: 0.3 mg/dL (ref 0.0–1.2)
CO2: 21 mmol/L (ref 20–29)
Calcium: 9.6 mg/dL (ref 8.7–10.2)
Chloride: 100 mmol/L (ref 96–106)
Creatinine, Ser: 0.73 mg/dL (ref 0.57–1.00)
GFR calc Af Amer: 120 mL/min/{1.73_m2} (ref >59)
GFR calc non Af Amer: 104 mL/min/{1.73_m2} (ref >59)
Globulin, Total: 3.1 g/dL (ref 1.5–4.5)
Glucose: 243 mg/dL — ABNORMAL HIGH (ref 65–99)
Potassium: 4.1 mmol/L (ref 3.5–5.2)
Sodium: 140 mmol/L (ref 134–144)
Total Protein: 7.1 g/dL (ref 6.0–8.5)

## 2020-02-29 LAB — CBC
Hematocrit: 41.1 % (ref 34.0–46.6)
Hemoglobin: 13.7 g/dL (ref 11.1–15.9)
MCH: 28.1 pg (ref 26.6–33.0)
MCHC: 33.3 g/dL (ref 31.5–35.7)
MCV: 84 fL (ref 79–97)
Platelets: 392 10*3/uL (ref 150–450)
RBC: 4.87 x10E6/uL (ref 3.77–5.28)
RDW: 13.5 % (ref 11.7–15.4)
WBC: 15.5 10*3/uL — ABNORMAL HIGH (ref 3.4–10.8)

## 2020-02-29 LAB — HEMOGLOBIN A1C
Est. average glucose Bld gHb Est-mCnc: 212 mg/dL
Hgb A1c MFr Bld: 9 % — ABNORMAL HIGH (ref 4.8–5.6)

## 2020-02-29 MED ORDER — FLUCONAZOLE 150 MG PO TABS: 150.0000 mg | ORAL_TABLET | Freq: Once | ORAL | 0 refills | Status: DC

## 2020-03-03 ENCOUNTER — Other Ambulatory Visit: Payer: Self-pay | Admitting: Nurse Practitioner

## 2020-03-03 DIAGNOSIS — E781 Pure hyperglyceridemia: Secondary | ICD-10-CM

## 2020-03-03 DIAGNOSIS — E1165 Type 2 diabetes mellitus with hyperglycemia: Secondary | ICD-10-CM

## 2020-03-03 DIAGNOSIS — I10 Essential (primary) hypertension: Secondary | ICD-10-CM

## 2020-03-03 MED ORDER — OMEGA-3-ACID ETHYL ESTERS 1 G PO CAPS: 2.0000 g | ORAL_CAPSULE | Freq: Two times a day (BID) | ORAL | 1 refills | Status: DC

## 2020-03-03 MED ORDER — METRONIDAZOLE 500 MG PO TABS: 500.0000 mg | ORAL_TABLET | Freq: Two times a day (BID) | ORAL | 0 refills | Status: DC

## 2020-03-03 MED ORDER — DAPAGLIFLOZIN PROPANEDIOL 10 MG PO TABS: 10.0000 mg | ORAL_TABLET | Freq: Every day | ORAL | 1 refills | Status: AC

## 2020-03-03 MED ORDER — AMLODIPINE BESYLATE 10 MG PO TABS: 10.0000 mg | ORAL_TABLET | Freq: Every day | ORAL | 1 refills | Status: DC

## 2020-03-03 MED ORDER — ROSUVASTATIN CALCIUM 20 MG PO TABS: 20.0000 mg | ORAL_TABLET | Freq: Every day | ORAL | 3 refills | Status: DC

## 2020-03-03 MED ORDER — FLUCONAZOLE 150 MG PO TABS: 150.0000 mg | ORAL_TABLET | Freq: Once | ORAL | 0 refills | Status: AC

## 2020-03-03 MED ORDER — JANUMET 50-1000 MG PO TABS: 1.0000 | ORAL_TABLET | Freq: Two times a day (BID) | ORAL | 2 refills | Status: DC

## 2020-07-11 ENCOUNTER — Encounter: Payer: Self-pay | Admitting: Nurse Practitioner

## 2020-07-11 ENCOUNTER — Other Ambulatory Visit: Payer: Self-pay | Admitting: Nurse Practitioner

## 2020-07-11 DIAGNOSIS — I10 Essential (primary) hypertension: Secondary | ICD-10-CM

## 2020-07-11 DIAGNOSIS — E781 Pure hyperglyceridemia: Secondary | ICD-10-CM

## 2020-07-11 DIAGNOSIS — E1165 Type 2 diabetes mellitus with hyperglycemia: Secondary | ICD-10-CM

## 2020-07-11 MED ORDER — JANUMET 50-1000 MG PO TABS: 1.0000 | ORAL_TABLET | Freq: Two times a day (BID) | ORAL | 0 refills | Status: DC

## 2020-07-11 MED ORDER — AMLODIPINE BESYLATE 10 MG PO TABS: 10.0000 mg | ORAL_TABLET | Freq: Every day | ORAL | 0 refills | Status: DC

## 2020-07-11 MED ORDER — OMEGA-3-ACID ETHYL ESTERS 1 G PO CAPS: 2.0000 g | ORAL_CAPSULE | Freq: Two times a day (BID) | ORAL | 0 refills | Status: DC

## 2020-07-11 MED ORDER — ROSUVASTATIN CALCIUM 20 MG PO TABS: 20.0000 mg | ORAL_TABLET | Freq: Every day | ORAL | 3 refills | Status: DC

## 2020-07-11 MED ORDER — LISINOPRIL-HYDROCHLOROTHIAZIDE 10-12.5 MG PO TABS: 1.0000 | ORAL_TABLET | Freq: Every day | ORAL | 0 refills | Status: DC

## 2020-08-31 ENCOUNTER — Other Ambulatory Visit: Payer: Self-pay

## 2020-09-17 ENCOUNTER — Encounter: Payer: Self-pay | Admitting: Nurse Practitioner

## 2020-11-17 ENCOUNTER — Encounter (HOSPITAL_COMMUNITY): Payer: Self-pay

## 2020-11-17 ENCOUNTER — Ambulatory Visit (HOSPITAL_COMMUNITY)
Admission: EM | Admit: 2020-11-17 | Discharge: 2020-11-17 | Disposition: A | Discharge status: Home / Self Care | Payer: Medicaid Other | Attending: Student | Admitting: Student

## 2020-11-17 DIAGNOSIS — N760 Acute vaginitis: Secondary | ICD-10-CM | POA: Diagnosis not present

## 2020-11-17 DIAGNOSIS — I10 Essential (primary) hypertension: Secondary | ICD-10-CM | POA: Diagnosis not present

## 2020-11-17 DIAGNOSIS — S39012A Strain of muscle, fascia and tendon of lower back, initial encounter: Secondary | ICD-10-CM | POA: Diagnosis not present

## 2020-11-17 DIAGNOSIS — B9689 Other specified bacterial agents as the cause of diseases classified elsewhere: Secondary | ICD-10-CM | POA: Insufficient documentation

## 2020-11-17 LAB — POCT URINALYSIS DIPSTICK, ED / UC
Bilirubin Urine: NEGATIVE
Glucose, UA: >=1000 mg/dL — AB
Hgb urine dipstick: NEGATIVE
Ketones, ur: NEGATIVE mg/dL
Leukocytes,Ua: NEGATIVE
Nitrite: NEGATIVE
Protein, ur: NEGATIVE mg/dL
Specific Gravity, Urine: 1.015 (ref 1.005–1.030)
Urobilinogen, UA: 1 mg/dL (ref 0.0–1.0)
pH: 5.5 (ref 5.0–8.0)

## 2020-11-17 MED ORDER — METRONIDAZOLE 500 MG PO TABS: 500.0000 mg | ORAL_TABLET | Freq: Two times a day (BID) | ORAL | 0 refills | Status: DC

## 2020-11-17 NOTE — ED Triage Notes (Signed)
Pt in with c/o left lower abdominal pain, left lower back pain and burning during urination for a few days

## 2020-11-17 NOTE — Discharge Instructions (Addendum)
-  For bacterial vaginosis, start the antibiotic-Flagyl (metronidazole), 2 pills daily for 7 days.  You can take this with food if you have a sensitive stomach.  Avoid alcohol while taking this medication and for 2 days after as this will cause severe nausea and vomiting. -We will call you if your have any other abnormal results -Seek additional medical attention if you develop new symptoms like worsening abdominal pain, back pain, worsening vaginal symptoms despite treatment, new fever/chills. -Please check your blood pressure at home or at the pharmacy. If this continues to be >140/90, follow-up with your primary care provider for further blood pressure management/ medication titration. If you develop chest pain, shortness of breath, vision changes, the worst headache of your life- head straight to the ED or call 911. -Tylenol/ibuprofen for back pain.

## 2020-11-17 NOTE — ED Provider Notes (Signed)
[facility]    CSN: [id number] Arrival date & time: 11/17/20  1139      History   Chief Complaint Chief Complaint  Patient presents with   Abdominal Pain   Back Pain    HPI Samantha [name] is a 40 y.o. female presenting with vaginal discharge, dysuria for 3 days. Also with L lower back pain.  Medical history bacterial vaginosis, vaginal candidiasis, essential hypertension, type 2 diabetes.  Notes 3 days of intermittent crampy lower abdominal pain, scant vaginal discharge unsure of color, dysuria.  Denies new partners.  States these seem like her typical BV symptoms. Denies hematuria, frequency, urgency, n/v/d, fevers/chills, abdnormal vaginal lesions.  Also notes left-sided lumbar paraspinous muscle tenderness for few days, states this is worse with movement. Denies pain shooting down legs, denies numbness in arms/legs, denies weakness in arms/legs, denies saddle anesthesia, denies bowel/bladder incontinence. BP is significantly elevated, she has not taken her antihypertensives yet today. Denies dizziness, chest pain, shortness of breath, weakness, headaches, vision changes.  HPI  Past Medical History:  Diagnosis Date   Diabetes mellitus without complication ([place])    Hyperlipidemia    Hypertension     Patient Active Problem List   Diagnosis Date Noted   Hyperlipidemia associated with type 2 diabetes mellitus ([place]) 04/26/2017   Type 2 diabetes mellitus with hyperglycemia ([place]) 11/12/2015   Essential hypertension 11/04/2014    History reviewed. No pertinent surgical history.  OB History     Gravida  9   Para  7   Term  7   Preterm      AB  1   Living         SAB  1   IAB      Ectopic      Multiple      Live Births               Home Medications    Prior to Admission medications   Medication Sig Start Date End Date Taking? Authorizing Provider  metroNIDAZOLE (FLAGYL) 500 MG tablet Take 1 tablet (500 mg total) by mouth 2 (two) times  daily. 11/17/20  Yes Hazel Sams, PA-C  albuterol (VENTOLIN HFA) 108 (90 Base) MCG/ACT inhaler Inhale 2 puffs into the lungs every 6 (six) hours as needed for wheezing or shortness of breath. 11/20/19   [name], NP  amLODipine (NORVASC) 10 MG tablet TAKE 1 TABLET (10 MG TOTAL) BY MOUTH DAILY 07/11/20 07/11/21  [name], NP  blood glucose meter kit and supplies Dispense based on patient and insurance preference. Use up to two times daily as directed. ICD-10 E11.65. Patient not taking: Reported on 12/26/2019 11/20/19   [name], NP  Blood Pressure Monitor DEVI Please provide patient with insurance approved blood pressure monitor Patient not taking: Reported on 12/26/2019 12/24/18   [name], NP  busPIRone (BUSPAR) 5 MG tablet Take 2 tablets (10 mg total) by mouth 3 (three) times daily as needed. 11/20/19   [name], NP  lisinopril-hydrochlorothiazide (ZESTORETIC) 10-12.5 MG tablet TAKE 1 TABLET BY MOUTH DAILY. 07/11/20 07/11/21  [name], NP  Misc. Devices MISC Please provide patient with insurance approved blood pressure monitor Patient not taking: Reported on 12/26/2019 12/24/18   [name], NP  NUVARING 0.12-0.015 MG/24HR vaginal ring INSERT VAGINALLY AND LEAVE IN PLACE FOR 3 CONSECUTIVE WEEKS, THEN REMOVE FOR 1 WEEK. 11/20/19 11/19/20  [name], NP  omega-3 acid ethyl esters (LOVAZA) 1  g capsule TAKE 2 CAPSULES (2 G TOTAL) BY MOUTH 2 (TWO) TIMES DAILY. 07/11/20 07/11/21  [name], NP  rosuvastatin (CRESTOR) 20 MG tablet TAKE 1 TABLET (20 MG TOTAL) BY MOUTH DAILY. 07/11/20 07/11/21  [name], NP  sitaGLIPtin-metformin (JANUMET) 50-1000 MG tablet TAKE 1 TABLET BY MOUTH 2 (TWO) TIMES DAILY WITH A MEAL. 07/11/20 07/11/21  [name], NP    Family History Family History  Problem Relation Age of Onset   Cancer Father     Social History Social History   Tobacco Use   Smoking status: Every Day    Packs/day: 0.50    Years:  0.40    Pack years: 0.20    Types: Cigarettes   Smokeless tobacco: Never  Vaping Use   Vaping Use: Never used  Substance Use Topics   Alcohol use: No   Drug use: No     Allergies   Patient has no known allergies.   Review of Systems Review of Systems  Constitutional:  Negative for appetite change, chills, diaphoresis and fever.  Respiratory:  Negative for shortness of breath.   Cardiovascular:  Negative for chest pain.  Gastrointestinal:  Positive for abdominal pain. Negative for blood in stool, constipation, diarrhea, nausea and vomiting.  Genitourinary:  Positive for dysuria and vaginal discharge. Negative for decreased urine volume, difficulty urinating, flank pain, frequency, genital sores, hematuria, urgency, vaginal bleeding and vaginal pain.  Musculoskeletal:  Positive for back pain.  Neurological:  Negative for dizziness, weakness and light-headedness.  All other systems reviewed and are negative.   Physical Exam Triage Vital Signs ED Triage Vitals  Enc Vitals Group     BP 11/17/20 1310 (!) 189/133     Pulse Rate 11/17/20 1310 (!) 105     Resp 11/17/20 1310 19     Temp 11/17/20 1310 99.3 F (37.4 C)     Temp Source 11/17/20 1310 Oral     SpO2 11/17/20 1310 99 %     Weight --      Height --      Head Circumference --      Peak Flow --      Pain Score 11/17/20 1309 10     Pain Loc --      Pain Edu? --      Excl. in [place]? --    No data found.  Updated Vital Signs BP (!) 189/133 (BP Location: Left Arm)   Pulse (!) 105   Temp 99.3 F (37.4 C) (Oral)   Resp 19   LMP 10/29/2020 (Approximate)   SpO2 99%   Visual Acuity Right Eye Distance:   Left Eye Distance:   Bilateral Distance:    Right Eye Near:   Left Eye Near:    Bilateral Near:     Physical Exam Vitals reviewed.  Constitutional:      General: She is not in acute distress.    Appearance: Normal appearance. She is not ill-appearing.  HENT:     Head: Normocephalic and atraumatic.      Mouth/Throat:     Mouth: Mucous membranes are moist.     Comments: Moist mucous membranes Eyes:     Extraocular Movements: Extraocular movements intact.     Pupils: Pupils are equal, round, and reactive to light.  Cardiovascular:     Rate and Rhythm: Normal rate and regular rhythm.     Heart sounds: Normal heart sounds.  Pulmonary:     Effort: Pulmonary effort is normal.  Breath sounds: Normal breath sounds. No wheezing, rhonchi or rales.  Abdominal:     General: Bowel sounds are normal. There is no distension.     Palpations: Abdomen is soft. There is no mass.     Tenderness: There is no abdominal tenderness. There is no right CVA tenderness, left CVA tenderness, guarding or rebound. Negative signs include Murphy's sign, Rovsing's sign and McBurney's sign.  Genitourinary:    Comments: deferred Musculoskeletal:     Comments: Left-sided lumbar paraspinous muscle tenderness to palpation, no spinous deformity, step-off.  Pain elicited with flexion lumbar spine.  Negative straight leg raise bilaterally.  Skin:    General: Skin is warm.     Capillary Refill: Capillary refill takes less than 2 seconds.     Comments: Good skin turgor  Neurological:     General: No focal deficit present.     Mental Status: She is alert and oriented to person, place, and time.  Psychiatric:        Mood and Affect: Mood normal.        Behavior: Behavior normal.     UC Treatments / Results  Labs (all labs ordered are listed, but only abnormal results are displayed) Labs Reviewed  POCT URINALYSIS DIPSTICK, ED / UC - Abnormal; Notable for the following components:      Result Value   Glucose, UA >=1000 (*)    All other components within normal limits  URINE CULTURE  POC URINE PREG, ED  CERVICOVAGINAL ANCILLARY ONLY    EKG   Radiology No results found.  Procedures Procedures (including critical care time)  Medications Ordered in UC Medications - No data to display  Initial Impression /  Assessment and Plan / UC Course  I have reviewed the triage vital signs and the nursing notes.  Pertinent labs & imaging results that were available during my care of the patient were reviewed by me and considered in my medical decision making (see chart for details).     This patient is a 40 year old female presenting with suspected bacterial vaginosis.  Afebrile, nontachycardic, no abdominal pain or CVAT.  UA - wnl. Culture sent.  Urine pregnancy negative.  Denies STI risk.  Self swab sent for BV, yeast, gonorrhea, chlamydia, trichomonas.  Will treat with Flagyl as below.  Recommended Tylenol/ibuprofen for lumbar pain.  Take antihypertensives when you return home.  Coding this visit a Level 4 for review of past labs and notes (01/2020), ordering and interpretation of labs, and prescription drug management.  Final Clinical Impressions(s) / UC Diagnoses   Final diagnoses:  Essential hypertension  Bacterial vaginosis  Strain of lumbar region, initial encounter     Discharge Instructions      -For bacterial vaginosis, start the antibiotic-Flagyl (metronidazole), 2 pills daily for 7 days.  You can take this with food if you have a sensitive stomach.  Avoid alcohol while taking this medication and for 2 days after as this will cause severe nausea and vomiting. -We will call you if your have any other abnormal results -Seek additional medical attention if you develop new symptoms like worsening abdominal pain, back pain, worsening vaginal symptoms despite treatment, new fever/chills. -Please check your blood pressure at home or at the pharmacy. If this continues to be >140/90, follow-up with your primary care provider for further blood pressure management/ medication titration. If you develop chest pain, shortness of breath, vision changes, the worst headache of your life- head straight to the ED or call 911. -Tylenol/ibuprofen for  back pain.     ED Prescriptions     Medication  Sig Dispense Auth. Provider   metroNIDAZOLE (FLAGYL) 500 MG tablet Take 1 tablet (500 mg total) by mouth 2 (two) times daily. 14 tablet Hazel Sams, PA-C      PDMP not reviewed this encounter.   Hazel Sams, PA-C 11/17/20 1424

## 2020-11-18 LAB — URINE CULTURE: Culture: NO GROWTH

## 2020-11-18 LAB — POC URINE PREG, ED: Preg Test, Ur: NEGATIVE

## 2020-11-19 LAB — CERVICOVAGINAL ANCILLARY ONLY
Bacterial Vaginitis (gardnerella): POSITIVE — AB
Candida Glabrata: NEGATIVE
Candida Vaginitis: POSITIVE — AB
Comment: NEGATIVE
Comment: NEGATIVE
Comment: NEGATIVE
Comment: NEGATIVE
Comment: NORMAL
Neisseria Gonorrhea: NEGATIVE
Trichomonas: NEGATIVE

## 2020-11-21 ENCOUNTER — Other Ambulatory Visit: Payer: Self-pay

## 2020-11-21 ENCOUNTER — Telehealth (HOSPITAL_COMMUNITY): Payer: Self-pay

## 2020-11-21 MED ORDER — FLUCONAZOLE 150 MG PO TABS
150.0000 mg | ORAL_TABLET | Freq: Every day | ORAL | 0 refills | Status: AC
  Filled 2020-11-21 – 2020-12-04 (×2): qty 2, 2d supply, fill #0

## 2020-11-28 ENCOUNTER — Other Ambulatory Visit: Payer: Self-pay

## 2020-12-04 ENCOUNTER — Other Ambulatory Visit: Payer: Self-pay

## 2020-12-04 ENCOUNTER — Other Ambulatory Visit: Payer: Self-pay | Admitting: Nurse Practitioner

## 2020-12-04 DIAGNOSIS — I10 Essential (primary) hypertension: Secondary | ICD-10-CM

## 2020-12-04 MED FILL — Amlodipine Besylate Tab 10 MG (Base Equivalent): ORAL | 30 days supply | Qty: 30 | Fill #0 | Status: AC

## 2020-12-04 MED FILL — Rosuvastatin Calcium Tab 20 MG: ORAL | 30 days supply | Qty: 30 | Fill #0 | Status: AC

## 2020-12-04 MED FILL — Sitagliptin-Metformin HCl Tab 50-1000 MG: ORAL | 30 days supply | Qty: 60 | Fill #0 | Status: AC

## 2020-12-04 NOTE — Telephone Encounter (Signed)
Requested medication (s) are due for refill today: yes  Requested medication (s) are on the active medication list: yes  Last refill:  07/11/20 - 07/11/21 #30 0 refills   Future visit scheduled: yes 01/22/21  Notes to clinic:  CH[facility]- [facility] can patient get refill for enough tabs until future visit?      Requested Prescriptions  Pending Prescriptions Disp Refills   lisinopril-hydrochlorothiazide (ZESTORETIC) 10-12.5 MG tablet 30 tablet 0    Sig: TAKE 1 TABLET BY MOUTH DAILY.      Cardiovascular:  ACEI + Diuretic Combos Failed - 12/04/2020  3:37 PM      Failed - Na in normal range and within 180 days    Sodium  Date Value Ref Range Status  02/28/2020 140 134 - 144 mmol/L Final          Failed - K in normal range and within 180 days    Potassium  Date Value Ref Range Status  02/28/2020 4.1 3.5 - 5.2 mmol/L Final          Failed - Cr in normal range and within 180 days    Creatinine  Date Value Ref Range Status  08/23/2019 0.74 0.44 - 1.00 mg/dL Final   Creat  Date Value Ref Range Status  07/06/2016 0.71 0.50 - 1.10 mg/dL Final   Creatinine, Ser  Date Value Ref Range Status  02/28/2020 0.73 0.57 - 1.00 mg/dL Final   Creatinine, Urine  Date Value Ref Range Status  07/06/2016 122 20 - 320 mg/dL Final          Failed - Ca in normal range and within 180 days    Calcium  Date Value Ref Range Status  02/28/2020 9.6 8.7 - 10.2 mg/dL Final   Calcium, Ion  Date Value Ref Range Status  08/14/2017 1.16 1.15 - 1.40 mmol/L Final          Failed - Last BP in normal range    BP Readings from Last 1 Encounters:  11/17/20 (!) 189/133          Failed - Valid encounter within last 6 months    Recent Outpatient Visits           1 year ago Type 2 diabetes mellitus with hyperglycemia, without long-term current use of insulin ([place])   [place], [place] W, NP   1 year ago Type 2 diabetes mellitus with hyperglycemia, without long-term current  use of insulin [facility])   [place], [place], [place]   1 year ago Type 2 diabetes mellitus with hyperglycemia, without long-term current use of insulin [facility])   [place], [place] W, NP   1 year ago Type 2 diabetes mellitus with hyperglycemia, without long-term current use of insulin [facility])   [place], [place] W, NP   2 years ago Type 2 diabetes mellitus with hyperglycemia, without long-term current use of insulin [facility])   [place], [place] W, NP       Future Appointments             In 1 month [name], NP [place] - Patient is not pregnant

## 2020-12-06 ENCOUNTER — Other Ambulatory Visit: Payer: Self-pay

## 2020-12-06 ENCOUNTER — Other Ambulatory Visit: Payer: Self-pay | Admitting: Nurse Practitioner

## 2020-12-06 DIAGNOSIS — I10 Essential (primary) hypertension: Secondary | ICD-10-CM

## 2020-12-06 MED ORDER — LISINOPRIL-HYDROCHLOROTHIAZIDE 10-12.5 MG PO TABS
1.0000 | ORAL_TABLET | Freq: Every day | ORAL | 0 refills | Status: DC
  Filled 2020-12-06: qty 30, 30d supply, fill #0

## 2021-01-03 ENCOUNTER — Encounter: Payer: Self-pay | Admitting: Family Medicine

## 2021-01-03 ENCOUNTER — Other Ambulatory Visit: Payer: Self-pay

## 2021-01-03 ENCOUNTER — Other Ambulatory Visit: Payer: Self-pay | Admitting: Family Medicine

## 2021-01-03 ENCOUNTER — Ambulatory Visit: Payer: Medicaid Other | Attending: Family Medicine | Admitting: Family Medicine

## 2021-01-03 VITALS — BP 136/84 | HR 100 | Ht 62.0 in | Wt 179.0 lb

## 2021-01-03 DIAGNOSIS — E669 Obesity, unspecified: Secondary | ICD-10-CM | POA: Diagnosis not present

## 2021-01-03 DIAGNOSIS — F419 Anxiety disorder, unspecified: Secondary | ICD-10-CM

## 2021-01-03 DIAGNOSIS — E781 Pure hyperglyceridemia: Secondary | ICD-10-CM | POA: Diagnosis not present

## 2021-01-03 DIAGNOSIS — E1165 Type 2 diabetes mellitus with hyperglycemia: Secondary | ICD-10-CM | POA: Diagnosis not present

## 2021-01-03 DIAGNOSIS — I10 Essential (primary) hypertension: Secondary | ICD-10-CM | POA: Diagnosis not present

## 2021-01-03 DIAGNOSIS — F32A Depression, unspecified: Secondary | ICD-10-CM

## 2021-01-03 DIAGNOSIS — Z3040 Encounter for surveillance of contraceptives, unspecified: Secondary | ICD-10-CM

## 2021-01-03 DIAGNOSIS — F17200 Nicotine dependence, unspecified, uncomplicated: Secondary | ICD-10-CM

## 2021-01-03 LAB — POCT GLYCOSYLATED HEMOGLOBIN (HGB A1C): HbA1c, POC (controlled diabetic range): 11.6 % — AB (ref 0.0–7.0)

## 2021-01-03 MED ORDER — OMEGA-3-ACID ETHYL ESTERS 1 G PO CAPS
2.0000 | ORAL_CAPSULE | Freq: Two times a day (BID) | ORAL | 0 refills | Status: DC
  Filled 2021-01-03: qty 360, 90d supply, fill #0

## 2021-01-03 MED ORDER — PAROXETINE HCL 20 MG PO TABS
20.0000 mg | ORAL_TABLET | Freq: Every day | ORAL | 0 refills | Status: DC
  Filled 2021-01-03 – 2021-01-24 (×2): qty 90, 90d supply, fill #0

## 2021-01-03 MED ORDER — ROSUVASTATIN CALCIUM 20 MG PO TABS
ORAL_TABLET | Freq: Every day | ORAL | 0 refills | Status: DC
  Filled 2021-01-03 – 2021-04-01 (×2): qty 90, 90d supply, fill #0

## 2021-01-03 MED ORDER — AMLODIPINE BESYLATE 10 MG PO TABS
ORAL_TABLET | Freq: Every day | ORAL | 0 refills | Status: DC
  Filled 2021-01-03 – 2021-04-01 (×2): qty 90, 90d supply, fill #0

## 2021-01-03 MED ORDER — SITAGLIPTIN PHOS-METFORMIN HCL 50-1000 MG PO TABS
1.0000 | ORAL_TABLET | Freq: Two times a day (BID) | ORAL | 0 refills | Status: DC
  Filled 2021-01-03 – 2021-02-12 (×2): qty 180, 90d supply, fill #0

## 2021-01-03 MED ORDER — LISINOPRIL-HYDROCHLOROTHIAZIDE 10-12.5 MG PO TABS
1.0000 | ORAL_TABLET | Freq: Every day | ORAL | 0 refills | Status: DC
  Filled 2021-01-03 – 2021-01-24 (×2): qty 90, 90d supply, fill #0

## 2021-01-03 NOTE — Progress Notes (Signed)
Needs refill on medications.

## 2021-01-03 NOTE — Progress Notes (Signed)
Established Patient Office Visit  Subjective:  Patient ID: Samantha [name], female    DOB: 04/13/1981  Age: 40 y.o. MRN: [id number]  CC:  Chief Complaint  Patient presents with   Hypertension    HPI Samantha [name] presents for follow up of chronic med issues including diabetes and hypertension.  She reports some recent  days of down mood 2/2 large amount of social stressors at this time. She also reports that her mood has affected her to the point of not always taking her other meds as recommended for her chronic med problems.   Past Medical History:  Diagnosis Date   Diabetes mellitus without complication ([place])    Hyperlipidemia    Hypertension     History reviewed. No pertinent surgical history.  Family History  Problem Relation Age of Onset   Cancer Father     Social History   Socioeconomic History   Marital status: Single    Spouse name: Not on file   Number of children: Not on file   Years of education: Not on file   Highest education level: Not on file  Occupational History   Not on file  Tobacco Use   Smoking status: Every Day    Packs/day: 0.50    Years: 0.40    Pack years: 0.20    Types: Cigarettes   Smokeless tobacco: Never  Vaping Use   Vaping Use: Never used  Substance and Sexual Activity   Alcohol use: No   Drug use: No   Sexual activity: Yes    Birth control/protection: None  Other Topics Concern   Not on file  Social History Narrative   Not on file   Social Determinants of Health   Financial Resource Strain: Not on file  Food Insecurity: Not on file  Transportation Needs: Not on file  Physical Activity: Not on file  Stress: Not on file  Social Connections: Not on file  Intimate Partner Violence: Not on file    Outpatient Medications Prior to Visit  Medication Sig Dispense Refill   albuterol (VENTOLIN HFA) 108 (90 Base) MCG/ACT inhaler Inhale 2 puffs into the lungs every 6 (six) hours as needed for wheezing or shortness of breath.  6.7 g 2   busPIRone (BUSPAR) 5 MG tablet Take 2 tablets (10 mg total) by mouth 3 (three) times daily as needed. 60 tablet 1   amLODipine (NORVASC) 10 MG tablet TAKE 1 TABLET (10 MG TOTAL) BY MOUTH DAILY 30 tablet 0   lisinopril-hydrochlorothiazide (ZESTORETIC) 10-12.5 MG tablet TAKE 1 TABLET BY MOUTH DAILY. 30 tablet 0   rosuvastatin (CRESTOR) 20 MG tablet TAKE 1 TABLET (20 MG TOTAL) BY MOUTH DAILY. 90 tablet 3   sitaGLIPtin-metformin (JANUMET) 50-1000 MG tablet TAKE 1 TABLET BY MOUTH 2 (TWO) TIMES DAILY WITH A MEAL. 60 tablet 0   blood glucose meter kit and supplies Dispense based on patient and insurance preference. Use up to two times daily as directed. ICD-10 E11.65. (Patient not taking: No sig reported) 1 each 0   Blood Pressure Monitor DEVI Please provide patient with insurance approved blood pressure monitor (Patient not taking: No sig reported) 1 Device 0   metroNIDAZOLE (FLAGYL) 500 MG tablet Take 1 tablet (500 mg total) by mouth 2 (two) times daily. (Patient not taking: Reported on 01/03/2021) 14 tablet 0   Misc. Devices MISC Please provide patient with insurance approved blood pressure monitor (Patient not taking: No sig reported) 1 each 0   NUVARING 0.12-0.015 MG/24HR vaginal ring INSERT  VAGINALLY AND LEAVE IN PLACE FOR 3 CONSECUTIVE WEEKS, THEN REMOVE FOR 1 WEEK. 1 each 6   omega-3 acid ethyl esters (LOVAZA) 1 g capsule TAKE 2 CAPSULES (2 G TOTAL) BY MOUTH 2 (TWO) TIMES DAILY. (Patient not taking: Reported on 01/03/2021) 120 capsule 0   No facility-administered medications prior to visit.    No Known Allergies  ROS Review of Systems  Psychiatric/Behavioral:  Positive for sleep disturbance. Negative for self-injury and suicidal ideas. The patient is nervous/anxious.   All other systems reviewed and are negative.    Objective:    Physical Exam Vitals and nursing note reviewed.  Constitutional:      General: She is not in acute distress.    Appearance: She is obese.   Cardiovascular:     Rate and Rhythm: Normal rate and regular rhythm.  Pulmonary:     Effort: Pulmonary effort is normal.     Breath sounds: Normal breath sounds.  Abdominal:     Tenderness: There is no abdominal tenderness.  Neurological:     General: No focal deficit present.     Mental Status: She is alert and oriented to person, place, and time.  Psychiatric:        Mood and Affect: Mood is depressed.        Behavior: Behavior normal. Behavior is cooperative.        Thought Content: Thought content normal.    BP 136/84   Pulse 100   Ht '5\' 2"'  (1.575 m)   Wt 179 lb (81.2 kg)   SpO2 99%   BMI 32.74 kg/m  Wt Readings from Last 3 Encounters:  01/03/21 179 lb (81.2 kg)  11/20/19 203 lb (92.1 kg)  10/25/19 200 lb (90.7 kg)     Health Maintenance Due  Topic Date Due   COVID-19 Vaccine (1) Never done   Hepatitis C Screening  Never done   PAP SMEAR-Modifier  06/01/2017   FOOT EXAM  07/06/2017   Pneumococcal Vaccine 60-42 Years old (2 - PCV) 09/09/2017   OPHTHALMOLOGY EXAM  01/27/2020   INFLUENZA VACCINE  12/30/2020    There are no preventive care reminders to display for this patient.   Lab Results  Component Value Date   HGBA1C 11.6 (A) 01/03/2021      Assessment & Plan:   1. Essential hypertension Encouraged compliance - meds refilled - will mnitor  2. Type 2 diabetes mellitus with hyperglycemia, without long-term current use of insulin (HCC) Encouraged compliance - meds refilled - discussed A1c and set A1c goal of less than 8 first 3 months and less than 7 thereafter  3. Hypertriglyceridemia Encouraged complinaced - meds refilled - monitoring labs ordered. continue  4. Obesity (BMI 30-39.9) Dietary and activity options discussed - goal is 2-4lbs/mo weight loss  5. Anxiety and depression Begin paxil 20 mg daily and monitor - follow up win 25-30 days  Meds ordered this encounter  Medications   amLODipine (NORVASC) 10 MG tablet    Sig: TAKE 1 TABLET (10  MG TOTAL) BY MOUTH DAILY    Dispense:  90 tablet    Refill:  0   lisinopril-hydrochlorothiazide (ZESTORETIC) 10-12.5 MG tablet    Sig: TAKE 1 TABLET BY MOUTH DAILY.    Dispense:  90 tablet    Refill:  0   rosuvastatin (CRESTOR) 20 MG tablet    Sig: TAKE 1 TABLET (20 MG TOTAL) BY MOUTH DAILY.    Dispense:  90 tablet    Refill:  0  sitaGLIPtin-metformin (JANUMET) 50-1000 MG tablet    Sig: TAKE 1 TABLET BY MOUTH 2 (TWO) TIMES DAILY WITH A MEAL.    Dispense:  180 tablet    Refill:  0   omega-3 acid ethyl esters (LOVAZA) 1 g capsule    Sig: TAKE 2 CAPSULES (2 G TOTAL) BY MOUTH 2 (TWO) TIMES DAILY.    Dispense:  360 capsule    Refill:  0   PARoxetine (PAXIL) 20 MG tablet    Sig: Take 1 tablet (20 mg total) by mouth daily.    Dispense:  90 tablet    Refill:  0    Follow-up: Return in about 4 weeks (around 01/31/2021) for video visit follow up of depression.    Becky Sax, MD

## 2021-01-04 LAB — CMP14+EGFR
ALT: 16 IU/L (ref 0–32)
AST: 14 IU/L (ref 0–40)
Albumin/Globulin Ratio: 1.6 (ref 1.2–2.2)
Albumin: 4.5 g/dL (ref 3.8–4.8)
Alkaline Phosphatase: 94 IU/L (ref 44–121)
BUN/Creatinine Ratio: 13 (ref 9–23)
BUN: 10 mg/dL (ref 6–24)
Bilirubin Total: 0.4 mg/dL (ref 0.0–1.2)
CO2: 23 mmol/L (ref 20–29)
Calcium: 10.2 mg/dL (ref 8.7–10.2)
Chloride: 98 mmol/L (ref 96–106)
Creatinine, Ser: 0.76 mg/dL (ref 0.57–1.00)
Globulin, Total: 2.9 g/dL (ref 1.5–4.5)
Glucose: 373 mg/dL — ABNORMAL HIGH (ref 65–99)
Potassium: 3.9 mmol/L (ref 3.5–5.2)
Sodium: 139 mmol/L (ref 134–144)
Total Protein: 7.4 g/dL (ref 6.0–8.5)
eGFR: 102 mL/min/{1.73_m2} (ref >59)

## 2021-01-04 LAB — LIPID PANEL
Chol/HDL Ratio: 4 ratio (ref 0.0–4.4)
Cholesterol, Total: 190 mg/dL (ref 100–199)
HDL: 48 mg/dL (ref >39)
LDL Chol Calc (NIH): 107 mg/dL — ABNORMAL HIGH (ref 0–99)
Triglycerides: 205 mg/dL — ABNORMAL HIGH (ref 0–149)
VLDL Cholesterol Cal: 35 mg/dL (ref 5–40)

## 2021-01-07 ENCOUNTER — Other Ambulatory Visit: Payer: Self-pay

## 2021-01-07 MED ORDER — NUVARING 0.12-0.015 MG/24HR VA RING
VAGINAL_RING | VAGINAL | 6 refills | Status: DC
  Filled 2021-01-07 – 2021-11-10 (×2): qty 1, 28d supply, fill #0

## 2021-01-07 MED ORDER — ALBUTEROL SULFATE HFA 108 (90 BASE) MCG/ACT IN AERS
2.0000 | INHALATION_SPRAY | Freq: Four times a day (QID) | RESPIRATORY_TRACT | 2 refills | Status: DC | PRN
  Filled 2021-01-07 – 2021-01-24 (×2): qty 8.5, 25d supply, fill #0

## 2021-01-14 ENCOUNTER — Other Ambulatory Visit: Payer: Self-pay

## 2021-01-15 ENCOUNTER — Encounter: Payer: Self-pay | Admitting: Nurse Practitioner

## 2021-01-19 ENCOUNTER — Encounter: Payer: Self-pay | Admitting: Nurse Practitioner

## 2021-01-22 ENCOUNTER — Ambulatory Visit: Payer: Medicaid Other | Admitting: Nurse Practitioner

## 2021-01-24 ENCOUNTER — Other Ambulatory Visit: Payer: Self-pay

## 2021-02-12 ENCOUNTER — Other Ambulatory Visit: Payer: Self-pay

## 2021-02-17 ENCOUNTER — Other Ambulatory Visit: Payer: Self-pay

## 2021-04-01 ENCOUNTER — Other Ambulatory Visit: Payer: Self-pay

## 2021-04-01 ENCOUNTER — Encounter: Payer: Self-pay | Admitting: Nurse Practitioner

## 2021-04-02 ENCOUNTER — Other Ambulatory Visit: Payer: Self-pay | Admitting: Nurse Practitioner

## 2021-04-02 ENCOUNTER — Ambulatory Visit: Payer: Medicaid Other | Attending: Nurse Practitioner

## 2021-04-02 ENCOUNTER — Other Ambulatory Visit (HOSPITAL_COMMUNITY)
Admission: RE | Admit: 2021-04-02 | Discharge: 2021-04-02 | Disposition: A | Discharge status: Home / Self Care | Payer: Medicaid Other | Source: Ambulatory Visit | Attending: Nurse Practitioner | Admitting: Nurse Practitioner

## 2021-04-02 ENCOUNTER — Other Ambulatory Visit: Payer: Self-pay

## 2021-04-02 DIAGNOSIS — R399 Unspecified symptoms and signs involving the genitourinary system: Secondary | ICD-10-CM | POA: Insufficient documentation

## 2021-04-03 ENCOUNTER — Other Ambulatory Visit: Payer: Self-pay | Admitting: Nurse Practitioner

## 2021-04-03 LAB — CERVICOVAGINAL ANCILLARY ONLY
Bacterial Vaginitis (gardnerella): NEGATIVE
Candida Glabrata: NEGATIVE
Candida Vaginitis: POSITIVE — AB
Chlamydia: NEGATIVE
Comment: NEGATIVE
Comment: NEGATIVE
Comment: NEGATIVE
Comment: NEGATIVE
Comment: NEGATIVE
Comment: NORMAL
Neisseria Gonorrhea: NEGATIVE
Trichomonas: NEGATIVE

## 2021-04-03 LAB — MICROSCOPIC EXAMINATION
Bacteria, UA: NONE SEEN
Casts: NONE SEEN /lpf
RBC, Urine: NONE SEEN /hpf (ref 0–2)
WBC, UA: NONE SEEN /hpf (ref 0–5)

## 2021-04-03 LAB — URINALYSIS, COMPLETE
Bilirubin, UA: NEGATIVE
Ketones, UA: NEGATIVE
Leukocytes,UA: NEGATIVE
Nitrite, UA: NEGATIVE
Protein,UA: NEGATIVE
RBC, UA: NEGATIVE
Specific Gravity, UA: 1.015 (ref 1.005–1.030)
Urobilinogen, Ur: 0.2 mg/dL (ref 0.2–1.0)
pH, UA: 5.5 (ref 5.0–7.5)

## 2021-04-03 MED ORDER — FLUCONAZOLE 150 MG PO TABS
150.0000 mg | ORAL_TABLET | Freq: Once | ORAL | 1 refills | Status: AC
  Filled 2021-04-03: qty 1, 1d supply, fill #0

## 2021-04-04 ENCOUNTER — Other Ambulatory Visit: Payer: Self-pay

## 2021-04-29 DIAGNOSIS — Z20822 Contact with and (suspected) exposure to covid-19: Secondary | ICD-10-CM | POA: Diagnosis not present

## 2021-05-20 ENCOUNTER — Other Ambulatory Visit: Payer: Self-pay | Admitting: Family Medicine

## 2021-05-20 ENCOUNTER — Other Ambulatory Visit: Payer: Self-pay | Admitting: Nurse Practitioner

## 2021-05-20 ENCOUNTER — Other Ambulatory Visit: Payer: Self-pay

## 2021-05-20 DIAGNOSIS — I10 Essential (primary) hypertension: Secondary | ICD-10-CM

## 2021-05-20 DIAGNOSIS — E1165 Type 2 diabetes mellitus with hyperglycemia: Secondary | ICD-10-CM

## 2021-05-20 MED ORDER — JANUMET 50-1000 MG PO TABS
1.0000 | ORAL_TABLET | Freq: Two times a day (BID) | ORAL | 0 refills | Status: DC
  Filled 2021-05-20: qty 60, 30d supply, fill #0

## 2021-05-20 MED ORDER — LISINOPRIL-HYDROCHLOROTHIAZIDE 10-12.5 MG PO TABS
1.0000 | ORAL_TABLET | Freq: Every day | ORAL | 0 refills | Status: DC
  Filled 2021-05-20: qty 30, 30d supply, fill #0

## 2021-05-22 ENCOUNTER — Other Ambulatory Visit: Payer: Self-pay

## 2021-07-02 ENCOUNTER — Other Ambulatory Visit: Payer: Self-pay

## 2021-07-02 ENCOUNTER — Other Ambulatory Visit: Payer: Self-pay | Admitting: Family Medicine

## 2021-07-02 DIAGNOSIS — I10 Essential (primary) hypertension: Secondary | ICD-10-CM

## 2021-07-02 MED ORDER — LISINOPRIL-HYDROCHLOROTHIAZIDE 10-12.5 MG PO TABS
1.0000 | ORAL_TABLET | Freq: Every day | ORAL | 0 refills | Status: DC
  Filled 2021-07-02: qty 30, 30d supply, fill #0

## 2021-07-02 NOTE — Telephone Encounter (Signed)
Pt called, sounded like someone answered no response and line disconnects.

## 2021-07-02 NOTE — Telephone Encounter (Signed)
Requested Prescriptions  Pending Prescriptions Disp Refills   lisinopril-hydrochlorothiazide (ZESTORETIC) 10-12.5 MG tablet 30 tablet 0    Sig: TAKE 1 TABLET BY MOUTH DAILY.     Cardiovascular:  ACEI + Diuretic Combos Passed - 07/02/2021 10:07 AM      Passed - Na in normal range and within 180 days    Sodium  Date Value Ref Range Status  01/03/2021 139 134 - 144 mmol/L Final         Passed - K in normal range and within 180 days    Potassium  Date Value Ref Range Status  01/03/2021 3.9 3.5 - 5.2 mmol/L Final         Passed - Cr in normal range and within 180 days    Creatinine  Date Value Ref Range Status  08/23/2019 0.74 0.44 - 1.00 mg/dL Final   Creat  Date Value Ref Range Status  07/06/2016 0.71 0.50 - 1.10 mg/dL Final   Creatinine, Ser  Date Value Ref Range Status  01/03/2021 0.76 0.57 - 1.00 mg/dL Final   Creatinine, Urine  Date Value Ref Range Status  07/06/2016 122 20 - 320 mg/dL Final         Passed - eGFR is 30 or above and within 180 days    GFR, Est African American  Date Value Ref Range Status  07/06/2016 >89 >=60 mL/min Final   GFR, Est AFR Am  Date Value Ref Range Status  08/23/2019 >60 >60 mL/min Final   GFR calc Af Amer  Date Value Ref Range Status  02/28/2020 120 >59 mL/min/1.73 Final    Comment:    **[facility] currently reports eGFR in compliance with the current**   recommendations of the [employer]. [facility] will   update reporting as new guidelines are published from the NKF-ASN   Task force.    GFR, Est Non African American  Date Value Ref Range Status  07/06/2016 >89 >=60 mL/min Final   GFR, Estimated  Date Value Ref Range Status  08/23/2019 >60 >60 mL/min Final   GFR calc non Af Amer  Date Value Ref Range Status  02/28/2020 104 >59 mL/min/1.73 Final   eGFR  Date Value Ref Range Status  01/03/2021 102 >59 mL/min/1.73 Final         Passed - Patient is not pregnant      Passed - Last BP in normal range    BP  Readings from Last 1 Encounters:  01/03/21 136/84         Passed - Valid encounter within last 6 months    Recent Outpatient Visits          6 months ago Essential hypertension   [place], Clyde Canterbury, MD   1 year ago Type 2 diabetes mellitus with hyperglycemia, without long-term current use of insulin [facility])   [place], [place] W, NP   1 year ago Type 2 diabetes mellitus with hyperglycemia, without long-term current use of insulin [facility])   [place], [place], [place]   1 year ago Type 2 diabetes mellitus with hyperglycemia, without long-term current use of insulin [facility])   [place], [place] W, NP   2 years ago Type 2 diabetes mellitus with hyperglycemia, without long-term current use of insulin [facility])   [place], Vernia Buff, NP             \

## 2021-07-08 ENCOUNTER — Other Ambulatory Visit: Payer: Self-pay

## 2021-07-23 ENCOUNTER — Ambulatory Visit: Payer: Medicaid Other | Admitting: Physician Assistant

## 2021-08-11 ENCOUNTER — Other Ambulatory Visit: Payer: Self-pay | Admitting: Nurse Practitioner

## 2021-08-11 DIAGNOSIS — I10 Essential (primary) hypertension: Secondary | ICD-10-CM

## 2021-08-11 DIAGNOSIS — F17200 Nicotine dependence, unspecified, uncomplicated: Secondary | ICD-10-CM

## 2021-08-11 DIAGNOSIS — E1165 Type 2 diabetes mellitus with hyperglycemia: Secondary | ICD-10-CM

## 2021-08-11 NOTE — Telephone Encounter (Signed)
Requested medications are due for refill today.  yes  Requested medications are on the active medications list.  yes  Last refill. Zestoric 07/02/2021 #30 0 refills, Statin 01/03/2021 #90 0 refills, Janumet 05/20/2021 #60 0 refills, Albuterol 01/07/2021 8.5 2 refills,Amlodipine 01/03/2021 #90 0 refills  Future visit scheduled.   Yes - in 2 days  Notes to clinic.  Pt was last seen 01/03/2021, and was to RTC in 4 weeks for follow up. Pt has missed last appt. Please advise.    Requested Prescriptions  Pending Prescriptions Disp Refills   lisinopril-hydrochlorothiazide (ZESTORETIC) 10-12.5 MG tablet 30 tablet 0    Sig: TAKE 1 TABLET BY MOUTH DAILY.     Cardiovascular:  ACEI + Diuretic Combos Failed - 08/11/2021  2:15 PM      Failed - Na in normal range and within 180 days    Sodium  Date Value Ref Range Status  01/03/2021 139 134 - 144 mmol/L Final          Failed - K in normal range and within 180 days    Potassium  Date Value Ref Range Status  01/03/2021 3.9 3.5 - 5.2 mmol/L Final          Failed - Cr in normal range and within 180 days    Creatinine  Date Value Ref Range Status  08/23/2019 0.74 0.44 - 1.00 mg/dL Final   Creat  Date Value Ref Range Status  07/06/2016 0.71 0.50 - 1.10 mg/dL Final   Creatinine, Ser  Date Value Ref Range Status  01/03/2021 0.76 0.57 - 1.00 mg/dL Final   Creatinine, Urine  Date Value Ref Range Status  07/06/2016 122 20 - 320 mg/dL Final          Failed - eGFR is 30 or above and within 180 days    GFR, Est African American  Date Value Ref Range Status  07/06/2016 >89 >=60 mL/min Final   GFR, Est AFR Am  Date Value Ref Range Status  08/23/2019 >60 >60 mL/min Final   GFR calc Af Amer  Date Value Ref Range Status  02/28/2020 120 >59 mL/min/1.73 Final    Comment:    **[facility] currently reports eGFR in compliance with the current**   recommendations of the [employer]. [facility] will   update reporting as new guidelines are  published from the NKF-ASN   Task force.    GFR, Est Non African American  Date Value Ref Range Status  07/06/2016 >89 >=60 mL/min Final   GFR, Estimated  Date Value Ref Range Status  08/23/2019 >60 >60 mL/min Final   GFR calc non Af Amer  Date Value Ref Range Status  02/28/2020 104 >59 mL/min/1.73 Final   eGFR  Date Value Ref Range Status  01/03/2021 102 >59 mL/min/1.73 Final          Failed - Valid encounter within last 6 months    Recent Outpatient Visits           7 months ago Essential hypertension   [place] Dorna Mai, MD   1 year ago Type 2 diabetes mellitus with hyperglycemia, without long-term current use of insulin [facility])   [place], [place] W, NP   1 year ago Type 2 diabetes mellitus with hyperglycemia, without long-term current use of insulin [facility])   [place], [place], [place]   2 years ago Type 2 diabetes mellitus with hyperglycemia, without  long-term current use of insulin [facility])   [place] [facility] [place], [place] W, NP   2 years ago Type 2 diabetes mellitus with hyperglycemia, without long-term current use of insulin [facility])   [place] [facility] [place], Shea Stakes, NP       Future Appointments             In 2 days Claiborne Rigg, NP [facility]            Passed - Patient is not pregnant      Passed - Last BP in normal range    BP Readings from Last 1 Encounters:  01/03/21 136/84           rosuvastatin (CRESTOR) 20 MG tablet 90 tablet 0    Sig: TAKE 1 TABLET (20 MG TOTAL) BY MOUTH DAILY.     Cardiovascular:  Antilipid - Statins 2 Failed - 08/11/2021  2:15 PM      Failed - Lipid Panel in normal range within the last 12 months    Cholesterol, Total  Date Value Ref Range Status  01/03/2021 190 100 - 199 mg/dL Final   LDL Chol Calc (NIH)  Date Value Ref  Range Status  01/03/2021 107 (H) 0 - 99 mg/dL Final   HDL  Date Value Ref Range Status  01/03/2021 48 >39 mg/dL Final   Triglycerides  Date Value Ref Range Status  01/03/2021 205 (H) 0 - 149 mg/dL Final         Passed - Cr in normal range and within 360 days    Creatinine  Date Value Ref Range Status  08/23/2019 0.74 0.44 - 1.00 mg/dL Final   Creat  Date Value Ref Range Status  07/06/2016 0.71 0.50 - 1.10 mg/dL Final   Creatinine, Ser  Date Value Ref Range Status  01/03/2021 0.76 0.57 - 1.00 mg/dL Final   Creatinine, Urine  Date Value Ref Range Status  07/06/2016 122 20 - 320 mg/dL Final          Passed - Patient is not pregnant      Passed - Valid encounter within last 12 months    Recent Outpatient Visits           7 months ago Essential hypertension   [place] [facility] [place], Lauris Poag, MD   1 year ago Type 2 diabetes mellitus with hyperglycemia, without long-term current use of insulin [facility])   [place] [facility] [place], [place] W, NP   1 year ago Type 2 diabetes mellitus with hyperglycemia, without long-term current use of insulin [facility])   [place] [facility] [place], [place], [place]   2 years ago Type 2 diabetes mellitus with hyperglycemia, without long-term current use of insulin [facility])    [facility] [place], [place] W, NP   2 years ago Type 2 diabetes mellitus with hyperglycemia, without long-term current use of insulin [facility])    [facility] [place], Shea Stakes, NP       Future Appointments             In 2 days Claiborne Rigg, NP  [facility]             sitaGLIPtin-metformin (JANUMET) 50-1000 MG tablet 60 tablet 0    Sig: TAKE 1 TABLET BY MOUTH 2 (TWO) TIMES DAILY WITH A MEAL.  Endocrinology:  Diabetes - Biguanide + DPP-4 Inhibitor Combos Failed - 08/11/2021  2:15 PM      Failed - HBA1C is  between 0 and 7.9 and within 180 days    HbA1c, POC (controlled diabetic range)  Date Value Ref Range Status  01/03/2021 11.6 (A) 0.0 - 7.0 % Final          Failed - B12 Level in normal range and within 720 days    No results found for: VITAMINB12        Failed - Valid encounter within last 6 months    Recent Outpatient Visits           7 months ago Essential hypertension   [place] Dorna Mai, MD   1 year ago Type 2 diabetes mellitus with hyperglycemia, without long-term current use of insulin ([place])   [place], Vernia Buff, NP   1 year ago Type 2 diabetes mellitus with hyperglycemia, without long-term current use of insulin [facility])   [place], [place], [place]   2 years ago Type 2 diabetes mellitus with hyperglycemia, without long-term current use of insulin ([place])   [place], [place] W, NP   2 years ago Type 2 diabetes mellitus with hyperglycemia, without long-term current use of insulin ([place])   [place], Vernia Buff, NP       Future Appointments             In 2 days [name], NP [place]            Failed - CBC within normal limits and completed in the last 12 months    WBC  Date Value Ref Range Status  02/28/2020 15.5 (H) 3.4 - 10.8 x10E3/uL Final  08/14/2017 14.4 (H) 4.0 - 10.5 K/uL Final   WBC Count  Date Value Ref Range Status  08/23/2019 14.8 (H) 4.0 - 10.5 K/uL Final   RBC  Date Value Ref Range Status  02/28/2020 4.87 3.77 - 5.28 x10E6/uL Final  08/23/2019 4.76 3.87 - 5.11 MIL/uL Final   Hemoglobin  Date Value Ref Range Status  02/28/2020 13.7 11.1 - 15.9 g/dL Final   Hematocrit  Date Value Ref Range Status  02/28/2020 41.1 34.0 - 46.6 % Final   MCHC  Date Value Ref Range Status  02/28/2020 33.3 31.5 - 35.7 g/dL Final   08/23/2019 33.6 30.0 - 36.0 g/dL Final   [facility]  Date Value Ref Range Status  02/28/2020 28.1 26.6 - 33.0 pg Final  08/23/2019 27.9 26.0 - 34.0 pg Final   MCV  Date Value Ref Range Status  02/28/2020 84 79 - 97 fL Final   No results found for: PLTCOUNTKUC, LABPLAT, POCPLA RDW  Date Value Ref Range Status  02/28/2020 13.5 11.7 - 15.4 % Final         Passed - Cr in normal range and within 360 days    Creatinine  Date Value Ref Range Status  08/23/2019 0.74 0.44 - 1.00 mg/dL Final   Creat  Date Value Ref Range Status  07/06/2016 0.71 0.50 - 1.10 mg/dL Final   Creatinine, Ser  Date Value Ref Range Status  01/03/2021 0.76 0.57 - 1.00 mg/dL Final   Creatinine, Urine  Date Value Ref Range Status  07/06/2016 122 20 - 320 mg/dL Final  Passed - eGFR in normal range and within 360 days    GFR, Est African American  Date Value Ref Range Status  07/06/2016 >89 >=60 mL/min Final   GFR, Est AFR Am  Date Value Ref Range Status  08/23/2019 >60 >60 mL/min Final   GFR calc Af Amer  Date Value Ref Range Status  02/28/2020 120 >59 mL/min/1.73 Final    Comment:    **[facility] currently reports eGFR in compliance with the current**   recommendations of the [employer]. [facility] will   update reporting as new guidelines are published from the NKF-ASN   Task force.    GFR, Est Non African American  Date Value Ref Range Status  07/06/2016 >89 >=60 mL/min Final   GFR, Estimated  Date Value Ref Range Status  08/23/2019 >60 >60 mL/min Final   GFR calc non Af Amer  Date Value Ref Range Status  02/28/2020 104 >59 mL/min/1.73 Final   eGFR  Date Value Ref Range Status  01/03/2021 102 >59 mL/min/1.73 Final           albuterol (VENTOLIN HFA) 108 (90 Base) MCG/ACT inhaler 8.5 g 2    Sig: Inhale 2 puffs into the lungs every 6 (six) hours as needed for wheezing or shortness of breath.     Pulmonology:  Beta Agonists 2 Passed - 08/11/2021  2:15 PM       Passed - Last BP in normal range    BP Readings from Last 1 Encounters:  01/03/21 136/84          Passed - Last Heart Rate in normal range    Pulse Readings from Last 1 Encounters:  01/03/21 100          Passed - Valid encounter within last 12 months    Recent Outpatient Visits           7 months ago Essential hypertension   [place], Clyde Canterbury, MD   1 year ago Type 2 diabetes mellitus with hyperglycemia, without long-term current use of insulin [facility])   [place], [place] W, NP   1 year ago Type 2 diabetes mellitus with hyperglycemia, without long-term current use of insulin [facility])   [place], [place], [place]   2 years ago Type 2 diabetes mellitus with hyperglycemia, without long-term current use of insulin [facility])   [place], [place] W, NP   2 years ago Type 2 diabetes mellitus with hyperglycemia, without long-term current use of insulin [facility])   [place], Vernia Buff, NP       Future Appointments             In 2 days [name], NP [place]             amLODipine (NORVASC) 10 MG tablet 90 tablet 0    Sig: TAKE 1 TABLET (10 MG TOTAL) BY MOUTH DAILY     Cardiovascular: Calcium Channel Blockers 2 Failed - 08/11/2021  2:15 PM      Failed - Valid encounter within last 6 months    Recent Outpatient Visits           7 months ago Essential hypertension   [place] Dorna Mai, MD   1 year ago Type 2 diabetes mellitus with hyperglycemia, without long-term current use of  insulin [facility])   [place], [place] W, NP   1 year ago Type 2 diabetes mellitus with hyperglycemia, without long-term current use of insulin [facility])   [place], [place],  [place]   2 years ago Type 2 diabetes mellitus with hyperglycemia, without long-term current use of insulin [facility])   [place], [place] W, NP   2 years ago Type 2 diabetes mellitus with hyperglycemia, without long-term current use of insulin [facility])   [place] [name], NP       Future Appointments             In 2 days [name], NP [place] BP in normal range    BP Readings from Last 1 Encounters:  01/03/21 136/84          Passed - Last Heart Rate in normal range    Pulse Readings from Last 1 Encounters:  01/03/21 100

## 2021-08-11 NOTE — Telephone Encounter (Signed)
Medication Refill - Medication:  ?rosuvastatin (CRESTOR) 20 MG tablet  ?lisinopril-hydrochlorothiazide (ZESTORETIC) 10-12.5 MG tablet ?sitaGLIPtin-metformin (JANUMET) 50-1000 MG tablet  ?albuterol (VENTOLIN HFA) 108 (90 Base) MCG/ACT inhaler  ?amLODipine (NORVASC) 10 MG tablet  ? ? ?Has the patient contacted their pharmacy? Yes.   ?Contact PCP ? ?Preferred Pharmacy (with phone number or street name):  ?[facility] [address], [place], [place] [address]  ?Phone:  [phone]  Fax:  [phone] ? ?Has the patient been seen for an appointment in the last year OR does the patient have an upcoming appointment? Yes.   ? ?Agent: Please be advised that RX refills may take up to 3 business days. We ask that you follow-up with your pharmacy. ?

## 2021-08-13 ENCOUNTER — Other Ambulatory Visit: Payer: Self-pay

## 2021-08-13 ENCOUNTER — Ambulatory Visit: Payer: Medicaid Other | Attending: Nurse Practitioner | Admitting: Nurse Practitioner

## 2021-08-13 ENCOUNTER — Encounter: Payer: Self-pay | Admitting: Nurse Practitioner

## 2021-08-13 VITALS — BP 131/91 | HR 90 | Resp 16 | Wt 184.0 lb

## 2021-08-13 DIAGNOSIS — E1165 Type 2 diabetes mellitus with hyperglycemia: Secondary | ICD-10-CM | POA: Diagnosis not present

## 2021-08-13 DIAGNOSIS — F17210 Nicotine dependence, cigarettes, uncomplicated: Secondary | ICD-10-CM

## 2021-08-13 DIAGNOSIS — F419 Anxiety disorder, unspecified: Secondary | ICD-10-CM | POA: Diagnosis not present

## 2021-08-13 DIAGNOSIS — Z1159 Encounter for screening for other viral diseases: Secondary | ICD-10-CM

## 2021-08-13 DIAGNOSIS — I10 Essential (primary) hypertension: Secondary | ICD-10-CM

## 2021-08-13 DIAGNOSIS — F17200 Nicotine dependence, unspecified, uncomplicated: Secondary | ICD-10-CM

## 2021-08-13 DIAGNOSIS — D72825 Bandemia: Secondary | ICD-10-CM

## 2021-08-13 DIAGNOSIS — D72829 Elevated white blood cell count, unspecified: Secondary | ICD-10-CM

## 2021-08-13 DIAGNOSIS — R309 Painful micturition, unspecified: Secondary | ICD-10-CM

## 2021-08-13 DIAGNOSIS — E781 Pure hyperglyceridemia: Secondary | ICD-10-CM

## 2021-08-13 DIAGNOSIS — Z3040 Encounter for surveillance of contraceptives, unspecified: Secondary | ICD-10-CM

## 2021-08-13 LAB — POCT GLYCOSYLATED HEMOGLOBIN (HGB A1C): HbA1c, POC (controlled diabetic range): 11.9 % — AB (ref 0.0–7.0)

## 2021-08-13 LAB — GLUCOSE, POCT (MANUAL RESULT ENTRY): POC Glucose: 324 mg/dl — AB (ref 70–99)

## 2021-08-13 MED ORDER — BUSPIRONE HCL 5 MG PO TABS
10.0000 mg | ORAL_TABLET | Freq: Three times a day (TID) | ORAL | 1 refills | Status: DC | PRN
  Filled 2021-08-13 – 2021-11-20 (×2): qty 60, 10d supply, fill #0

## 2021-08-13 MED ORDER — TRULICITY 0.75 MG/0.5ML ~~LOC~~ SOAJ
0.7500 mg | SUBCUTANEOUS | 1 refills | Status: DC
  Filled 2021-08-13: qty 2, 28d supply, fill #0
  Filled 2021-09-08: qty 2, 28d supply, fill #1
  Filled 2021-10-06 – 2021-10-07 (×2): qty 2, 28d supply, fill #2

## 2021-08-13 MED ORDER — OMEGA-3-ACID ETHYL ESTERS 1 G PO CAPS
2.0000 | ORAL_CAPSULE | Freq: Two times a day (BID) | ORAL | 0 refills | Status: DC
  Filled 2021-08-13: qty 360, 90d supply, fill #0

## 2021-08-13 MED ORDER — LISINOPRIL-HYDROCHLOROTHIAZIDE 20-25 MG PO TABS
1.0000 | ORAL_TABLET | Freq: Every day | ORAL | 3 refills | Status: DC
  Filled 2021-08-13: qty 30, 30d supply, fill #0
  Filled 2021-09-15: qty 30, 30d supply, fill #1
  Filled 2021-10-21: qty 30, 30d supply, fill #2
  Filled 2021-11-20: qty 30, 30d supply, fill #3
  Filled 2022-01-01: qty 30, 30d supply, fill #4
  Filled 2022-02-20 – 2022-03-02 (×2): qty 30, 30d supply, fill #5
  Filled 2022-04-06: qty 30, 30d supply, fill #6
  Filled 2022-04-20 – 2022-05-04 (×2): qty 30, 30d supply, fill #7
  Filled 2022-06-03 (×2): qty 30, 30d supply, fill #8

## 2021-08-13 MED ORDER — METFORMIN HCL 1000 MG PO TABS
1000.0000 mg | ORAL_TABLET | Freq: Two times a day (BID) | ORAL | 3 refills | Status: DC
  Filled 2021-08-13: qty 60, 30d supply, fill #0

## 2021-08-13 MED ORDER — ALBUTEROL SULFATE HFA 108 (90 BASE) MCG/ACT IN AERS
2.0000 | INHALATION_SPRAY | Freq: Four times a day (QID) | RESPIRATORY_TRACT | 2 refills | Status: DC | PRN
  Filled 2021-08-13: qty 8.5, 25d supply, fill #0
  Filled 2022-04-06: qty 8.5, 25d supply, fill #1
  Filled 2022-06-03: qty 8.5, 25d supply, fill #2

## 2021-08-13 MED ORDER — ROSUVASTATIN CALCIUM 20 MG PO TABS
ORAL_TABLET | Freq: Every day | ORAL | 3 refills | Status: DC
  Filled 2021-08-13: qty 30, 30d supply, fill #0
  Filled 2021-09-15: qty 30, 30d supply, fill #1
  Filled 2021-10-21: qty 30, 30d supply, fill #2
  Filled 2021-11-20: qty 30, 30d supply, fill #3

## 2021-08-13 MED ORDER — JANUMET 50-1000 MG PO TABS
1.0000 | ORAL_TABLET | Freq: Two times a day (BID) | ORAL | 1 refills | Status: DC
  Filled 2021-08-13: qty 180, 90d supply, fill #0

## 2021-08-13 MED ORDER — AMLODIPINE BESYLATE 10 MG PO TABS
10.0000 mg | ORAL_TABLET | Freq: Every day | ORAL | 1 refills | Status: DC
  Filled 2021-08-13: qty 30, 30d supply, fill #0
  Filled 2021-09-15: qty 30, 30d supply, fill #1
  Filled 2021-10-21: qty 30, 30d supply, fill #2
  Filled 2021-11-20: qty 30, 30d supply, fill #3
  Filled 2022-01-01: qty 30, 30d supply, fill #4

## 2021-08-13 NOTE — Progress Notes (Signed)
Assessment & Plan:  Samantha [name] was seen today for diabetes and hypertension.  Diagnoses and all orders for this visit:  Essential hypertension -     CMP14+EGFR -     amLODipine (NORVASC) 10 MG tablet; Take 1 tablet (10 mg total) by mouth daily. -     lisinopril-hydrochlorothiazide (ZESTORETIC) 20-25 MG tablet; Take 1 tablet by mouth daily. Continue all antihypertensives as prescribed.  Remember to bring in your blood pressure log with you for your follow up appointment.  DASH/Mediterranean Diets are healthier choices for HTN.    Bandemia without diagnosis of specific infection -     CBC with Differential  Type 2 diabetes mellitus with hyperglycemia, without long-term current use of insulin (HCC) -     CMP14+EGFR -     POCT glucose (manual entry) -     POCT glycosylated hemoglobin (Hb A1C) -     Discontinue: sitaGLIPtin-metformin (JANUMET) 50-1000 MG tablet; Take 1 tablet by mouth 2 (two) times daily with a meal. -     Dulaglutide (TRULICITY) 0.75 MG/0.5ML SOPN; Inject 0.75 mg into the skin once a week. -     metFORMIN (GLUCOPHAGE) 1000 MG tablet; Take 1 tablet (1,000 mg total) by mouth 2 (two) times daily with a meal. Continue blood sugar control as discussed in office today, low carbohydrate diet, and regular physical exercise as tolerated, 150 minutes per week (30 min each day, 5 days per week, or 50 min 3 days per week). Keep blood sugar logs with fasting goal of 90-130 mg/dl, post prandial (after you eat) less than 180.  For Hypoglycemia: BS <60 and Hyperglycemia BS >400; contact the clinic ASAP. Annual eye exams and foot exams are recommended.   Anxiety -     busPIRone (BUSPAR) 5 MG tablet; Take 2 tablets (10 mg total) by mouth 3 (three) times daily as needed.  Tobacco dependence -     albuterol (VENTOLIN HFA) 108 (90 Base) MCG/ACT inhaler; Inhale 2 puffs into the lungs every 6 (six) hours as needed for wheezing or shortness of breath.  Hypertriglyceridemia -     omega-3 acid  ethyl esters (LOVAZA) 1 g capsule; TAKE 2 CAPSULES (2 G TOTAL) BY MOUTH 2 (TWO) TIMES DAILY.  Need for hepatitis C screening test -     HCV Ab w Reflex to Quant PCR  Painful urination -     Urinalysis, Complete -     Microscopic Examination -     Interpretation:    Patient has been counseled on age-appropriate routine health concerns for screening and prevention. These are reviewed and up-to-date. Referrals have been placed accordingly. Immunizations are up-to-date or declined.    Subjective:   Chief Complaint  Patient presents with   Diabetes   Hypertension    Samantha [name] 41 y.o. female presents to office today for follow up to HTN.  She has a past medical history of Diabetes mellitus without complication (HCC), Hyperlipidemia, and Hypertension.    DM 2  Poorly controlled. She adamantly refuses to start insulin today. She reluctantly agreed to starting Trulicity. I did assist her with teach back self administration of trulicity today. She will continue on janumet 50-1000 mg BID. LDL not at goal with crestor 20 mg daily. Needs to work on dietary modification and weight management.  Lab Results  Component Value Date   HGBA1C 11.9 (A) 08/13/2021     Lab Results  Component Value Date   HGBA1C 11.6 (A) 01/03/2021   Lab Results  Component  Value Date   LDLCALC 107 (H) 01/03/2021     HTN Blood pressure is not controlled. Will increase zestoretic to 20-25 mg daily and continue amlodipine 10 mg daily.  She is a current smoker as well.  BP Readings from Last 3 Encounters:  08/13/21 (!) 131/91  01/03/21 136/84  11/17/20 (!) 189/133     Review of Systems  Constitutional:  Negative for fever, malaise/fatigue and weight loss.  HENT: Negative.  Negative for nosebleeds.   Eyes: Negative.  Negative for blurred vision, double vision and photophobia.  Respiratory: Negative.  Negative for cough and shortness of breath.   Cardiovascular: Negative.  Negative for chest pain,  palpitations and leg swelling.  Gastrointestinal: Negative.  Negative for heartburn, nausea and vomiting.  Musculoskeletal: Negative.  Negative for myalgias.  Neurological: Negative.  Negative for dizziness, focal weakness, seizures and headaches.  Psychiatric/Behavioral: Negative.  Negative for suicidal ideas.    Past Medical History:  Diagnosis Date   Diabetes mellitus without complication (HCC)    Hyperlipidemia    Hypertension     No past surgical history on file.  Family History  Problem Relation Age of Onset   Cancer Father     Social History Reviewed with no changes to be made today.   Outpatient Medications Prior to Visit  Medication Sig Dispense Refill   blood glucose meter kit and supplies Dispense based on patient and insurance preference. Use up to two times daily as directed. ICD-10 E11.65. (Patient not taking: No sig reported) 1 each 0   Blood Pressure Monitor DEVI Please provide patient with insurance approved blood pressure monitor (Patient not taking: No sig reported) 1 Device 0   Misc. Devices MISC Please provide patient with insurance approved blood pressure monitor (Patient not taking: No sig reported) 1 each 0   NUVARING 0.12-0.015 MG/24HR vaginal ring INSERT VAGINALLY AND LEAVE IN PLACE FOR 3 CONSECUTIVE WEEKS, THEN REMOVE FOR 1 WEEK. 1 each 6   PARoxetine (PAXIL) 20 MG tablet Take 1 tablet (20 mg total) by mouth daily. 90 tablet 0   albuterol (VENTOLIN HFA) 108 (90 Base) MCG/ACT inhaler Inhale 2 puffs into the lungs every 6 (six) hours as needed for wheezing or shortness of breath. 8.5 g 2   amLODipine (NORVASC) 10 MG tablet TAKE 1 TABLET (10 MG TOTAL) BY MOUTH DAILY 90 tablet 0   busPIRone (BUSPAR) 5 MG tablet Take 2 tablets (10 mg total) by mouth 3 (three) times daily as needed. 60 tablet 1   lisinopril-hydrochlorothiazide (ZESTORETIC) 10-12.5 MG tablet TAKE 1 TABLET BY MOUTH DAILY. 30 tablet 0   metroNIDAZOLE (FLAGYL) 500 MG tablet Take 1 tablet (500 mg  total) by mouth 2 (two) times daily. (Patient not taking: Reported on 01/03/2021) 14 tablet 0   omega-3 acid ethyl esters (LOVAZA) 1 g capsule TAKE 2 CAPSULES (2 G TOTAL) BY MOUTH 2 (TWO) TIMES DAILY. 360 capsule 0   rosuvastatin (CRESTOR) 20 MG tablet TAKE 1 TABLET (20 MG TOTAL) BY MOUTH DAILY. 90 tablet 0   sitaGLIPtin-metformin (JANUMET) 50-1000 MG tablet TAKE 1 TABLET BY MOUTH 2 (TWO) TIMES DAILY WITH A MEAL. 60 tablet 0   No facility-administered medications prior to visit.    No Known Allergies     Objective:    BP (!) 131/91   Pulse 90   Resp 16   Wt 184 lb (83.5 kg)   SpO2 98%   BMI 33.65 kg/m  Wt Readings from Last 3 Encounters:  08/13/21 184 lb (83.5  kg)  01/03/21 179 lb (81.2 kg)  11/20/19 203 lb (92.1 kg)    Physical Exam Vitals and nursing note reviewed.  Constitutional:      Appearance: She is well-developed.  HENT:     Head: Normocephalic and atraumatic.  Cardiovascular:     Rate and Rhythm: Normal rate and regular rhythm.     Heart sounds: Normal heart sounds. No murmur heard.   No friction rub. No gallop.  Pulmonary:     Effort: Pulmonary effort is normal. No tachypnea or respiratory distress.     Breath sounds: Normal breath sounds. No decreased breath sounds, wheezing, rhonchi or rales.  Chest:     Chest wall: No tenderness.  Abdominal:     General: Bowel sounds are normal.     Palpations: Abdomen is soft.  Musculoskeletal:        General: Normal range of motion.     Cervical back: Normal range of motion.  Skin:    General: Skin is warm and dry.  Neurological:     Mental Status: She is alert and oriented to person, place, and time.     Coordination: Coordination normal.  Psychiatric:        Behavior: Behavior normal. Behavior is cooperative.        Thought Content: Thought content normal.        Judgment: Judgment normal.         Patient has been counseled extensively about nutrition and exercise as well as the importance of adherence with  medications and regular follow-up. The patient was given clear instructions to go to ER or return to medical center if symptoms don't improve, worsen or new problems develop. The patient verbalized understanding.   Follow-up: Return in about 6 weeks (around 09/24/2021) for BP CHECK see me in 3 months.   Claiborne Rigg, FNP-BC [facility] [place], [place] [phone]   08/14/2021, 8:51 AM

## 2021-08-14 ENCOUNTER — Other Ambulatory Visit: Payer: Self-pay

## 2021-08-14 ENCOUNTER — Telehealth: Payer: Self-pay

## 2021-08-14 ENCOUNTER — Other Ambulatory Visit: Payer: Self-pay | Admitting: Nurse Practitioner

## 2021-08-14 ENCOUNTER — Encounter: Payer: Self-pay | Admitting: Nurse Practitioner

## 2021-08-14 LAB — CBC WITH DIFFERENTIAL/PLATELET
Basophils Absolute: 0.1 10*3/uL (ref 0.0–0.2)
Basos: 1 %
EOS (ABSOLUTE): 0.3 10*3/uL (ref 0.0–0.4)
Eos: 2 %
Hematocrit: 43.4 % (ref 34.0–46.6)
Hemoglobin: 14.4 g/dL (ref 11.1–15.9)
Immature Grans (Abs): 0 10*3/uL (ref 0.0–0.1)
Immature Granulocytes: 0 %
Lymphocytes Absolute: 3.2 10*3/uL — ABNORMAL HIGH (ref 0.7–3.1)
Lymphs: 20 %
MCH: 28.3 pg (ref 26.6–33.0)
MCHC: 33.2 g/dL (ref 31.5–35.7)
MCV: 85 fL (ref 79–97)
Monocytes Absolute: 0.9 10*3/uL (ref 0.1–0.9)
Monocytes: 5 %
Neutrophils Absolute: 11.6 10*3/uL — ABNORMAL HIGH (ref 1.4–7.0)
Neutrophils: 72 %
Platelets: 372 10*3/uL (ref 150–450)
RBC: 5.08 x10E6/uL (ref 3.77–5.28)
RDW: 13.4 % (ref 11.7–15.4)
WBC: 16 10*3/uL — ABNORMAL HIGH (ref 3.4–10.8)

## 2021-08-14 LAB — MICROSCOPIC EXAMINATION
Casts: NONE SEEN /lpf
RBC, Urine: NONE SEEN /hpf (ref 0–2)

## 2021-08-14 LAB — CMP14+EGFR
ALT: 14 IU/L (ref 0–32)
AST: 11 IU/L (ref 0–40)
Albumin/Globulin Ratio: 1.4 (ref 1.2–2.2)
Albumin: 4.1 g/dL (ref 3.8–4.8)
Alkaline Phosphatase: 74 IU/L (ref 44–121)
BUN/Creatinine Ratio: 13 (ref 9–23)
BUN: 8 mg/dL (ref 6–24)
Bilirubin Total: 0.3 mg/dL (ref 0.0–1.2)
CO2: 26 mmol/L (ref 20–29)
Calcium: 9.9 mg/dL (ref 8.7–10.2)
Chloride: 95 mmol/L — ABNORMAL LOW (ref 96–106)
Creatinine, Ser: 0.63 mg/dL (ref 0.57–1.00)
Globulin, Total: 2.9 g/dL (ref 1.5–4.5)
Glucose: 330 mg/dL — ABNORMAL HIGH (ref 70–99)
Potassium: 3.7 mmol/L (ref 3.5–5.2)
Sodium: 134 mmol/L (ref 134–144)
Total Protein: 7 g/dL (ref 6.0–8.5)
eGFR: 115 mL/min/{1.73_m2} (ref >59)

## 2021-08-14 LAB — URINALYSIS, COMPLETE
Bilirubin, UA: NEGATIVE
Ketones, UA: NEGATIVE
Nitrite, UA: NEGATIVE
Protein,UA: NEGATIVE
RBC, UA: NEGATIVE
Specific Gravity, UA: 1.022 (ref 1.005–1.030)
Urobilinogen, Ur: 0.2 mg/dL (ref 0.2–1.0)
pH, UA: 5.5 (ref 5.0–7.5)

## 2021-08-14 LAB — HCV AB W REFLEX TO QUANT PCR: HCV Ab: NONREACTIVE

## 2021-08-14 LAB — HCV INTERPRETATION

## 2021-08-14 MED ORDER — FLUCONAZOLE 150 MG PO TABS: 150.0000 mg | ORAL_TABLET | Freq: Once | ORAL | 0 refills | Status: AC

## 2021-08-14 MED ORDER — JANUMET 50-1000 MG PO TABS
1.0000 | ORAL_TABLET | Freq: Two times a day (BID) | ORAL | 1 refills | Status: DC
  Filled 2021-08-14: qty 180, 90d supply, fill #0
  Filled 2021-08-15: qty 60, 30d supply, fill #0
  Filled 2021-08-21: qty 180, 90d supply, fill #0
  Filled 2021-08-21 (×2): qty 60, 30d supply, fill #0

## 2021-08-14 NOTE — Telephone Encounter (Signed)
Called patient reviewed all information and repeated back to me. Will call if any questions.  ? ?

## 2021-08-14 NOTE — Telephone Encounter (Signed)
Called patient reviewed all information and repeated back to me. Will call if any questions.  ?Samantha [name] she had it done before and would probable not do it again. ?

## 2021-08-15 ENCOUNTER — Other Ambulatory Visit: Payer: Self-pay

## 2021-08-18 ENCOUNTER — Other Ambulatory Visit: Payer: Self-pay

## 2021-08-21 ENCOUNTER — Other Ambulatory Visit: Payer: Self-pay | Admitting: Nurse Practitioner

## 2021-08-21 ENCOUNTER — Other Ambulatory Visit: Payer: Self-pay

## 2021-08-21 MED ORDER — KOMBIGLYZE XR 5-1000 MG PO TB24
1.0000 | ORAL_TABLET | Freq: Every day | ORAL | 1 refills | Status: DC
  Filled 2021-08-21: qty 30, 30d supply, fill #0
  Filled 2021-09-15: qty 30, 30d supply, fill #1
  Filled 2021-10-21: qty 30, 30d supply, fill #2
  Filled 2021-11-20: qty 30, 30d supply, fill #3
  Filled 2022-01-01: qty 30, 30d supply, fill #4

## 2021-08-22 ENCOUNTER — Other Ambulatory Visit: Payer: Self-pay

## 2021-09-08 ENCOUNTER — Other Ambulatory Visit: Payer: Self-pay

## 2021-09-10 ENCOUNTER — Other Ambulatory Visit: Payer: Self-pay

## 2021-09-15 ENCOUNTER — Other Ambulatory Visit: Payer: Self-pay

## 2021-09-16 ENCOUNTER — Other Ambulatory Visit: Payer: Self-pay

## 2021-09-17 ENCOUNTER — Other Ambulatory Visit: Payer: Self-pay

## 2021-10-06 ENCOUNTER — Other Ambulatory Visit: Payer: Self-pay

## 2021-10-07 ENCOUNTER — Other Ambulatory Visit: Payer: Self-pay

## 2021-10-08 ENCOUNTER — Other Ambulatory Visit: Payer: Self-pay

## 2021-10-15 ENCOUNTER — Ambulatory Visit: Payer: Medicaid Other | Admitting: Nurse Practitioner

## 2021-10-21 ENCOUNTER — Other Ambulatory Visit: Payer: Self-pay

## 2021-10-22 ENCOUNTER — Other Ambulatory Visit: Payer: Self-pay

## 2021-10-23 ENCOUNTER — Other Ambulatory Visit: Payer: Self-pay

## 2021-11-04 ENCOUNTER — Other Ambulatory Visit: Payer: Self-pay | Admitting: Nurse Practitioner

## 2021-11-04 ENCOUNTER — Other Ambulatory Visit: Payer: Self-pay

## 2021-11-04 DIAGNOSIS — E1165 Type 2 diabetes mellitus with hyperglycemia: Secondary | ICD-10-CM

## 2021-11-04 MED ORDER — TRULICITY 0.75 MG/0.5ML ~~LOC~~ SOAJ
0.7500 mg | SUBCUTANEOUS | 1 refills | Status: DC
  Filled 2021-11-04: qty 3, 30d supply, fill #0
  Filled 2021-11-05: qty 2, 28d supply, fill #0
  Filled 2021-12-01: qty 2, 28d supply, fill #1
  Filled 2022-01-01: qty 2, 28d supply, fill #2

## 2021-11-04 NOTE — Telephone Encounter (Signed)
Requested Prescriptions  Pending Prescriptions Disp Refills  . Dulaglutide (TRULICITY) 2.72 ZD/6.6YQ SOPN 3 mL 1    Sig: Inject 0.75 mg into the skin once a week.     Endocrinology:  Diabetes - GLP-1 Receptor Agonists Failed - 11/04/2021 11:16 AM      Failed - HBA1C is between 0 and 7.9 and within 180 days    HbA1c, POC (controlled diabetic range)  Date Value Ref Range Status  08/13/2021 11.9 (A) 0.0 - 7.0 % Final         Passed - Valid encounter within last 6 months    Recent Outpatient Visits          2 months ago Essential hypertension   [place], Vernia Buff, NP   10 months ago Essential hypertension   [place] Dorna Mai, MD   1 year ago Type 2 diabetes mellitus with hyperglycemia, without long-term current use of insulin [facility])   [place], [place] W, NP   2 years ago Type 2 diabetes mellitus with hyperglycemia, without long-term current use of insulin [facility])   [place], [place], [place]   2 years ago Type 2 diabetes mellitus with hyperglycemia, without long-term current use of insulin [facility])   [place], Vernia Buff, NP

## 2021-11-04 NOTE — Telephone Encounter (Signed)
Requested Prescriptions  Pending Prescriptions Disp Refills  . Dulaglutide (TRULICITY) 6.37 CH/8.8FO SOPN 3 mL 1    Sig: Inject 0.75 mg into the skin once a week.     Endocrinology:  Diabetes - GLP-1 Receptor Agonists Failed - 11/04/2021 11:16 AM      Failed - HBA1C is between 0 and 7.9 and within 180 days    HbA1c, POC (controlled diabetic range)  Date Value Ref Range Status  08/13/2021 11.9 (A) 0.0 - 7.0 % Final         Passed - Valid encounter within last 6 months    Recent Outpatient Visits          2 months ago Essential hypertension   [place], Vernia Buff, NP   10 months ago Essential hypertension   [place] Dorna Mai, MD   1 year ago Type 2 diabetes mellitus with hyperglycemia, without long-term current use of insulin [facility])   [place], [place] W, NP   2 years ago Type 2 diabetes mellitus with hyperglycemia, without long-term current use of insulin [facility])   [place], [place], [place]   2 years ago Type 2 diabetes mellitus with hyperglycemia, without long-term current use of insulin [facility])   [place], Vernia Buff, NP

## 2021-11-05 ENCOUNTER — Other Ambulatory Visit: Payer: Self-pay

## 2021-11-10 ENCOUNTER — Ambulatory Visit: Payer: Self-pay | Admitting: *Deleted

## 2021-11-10 NOTE — Telephone Encounter (Signed)
Called pt asked did she want to do a virtual for tom. She declined I have referred her to the mobile clinic and will send address via mychart

## 2021-11-10 NOTE — Telephone Encounter (Signed)
The patient has experienced urinary concerns for a few days   The patient shares that their urine is the color of apple juice   Please contact further when possible   The patient shares that they have experienced no physical discomfort but they are less thirsty than usual   The patient shares that they have recently began trulicity and have had a decrease in appetite   Chief Complaint: Urine Darker Symptoms: States urine "Apple juice colored" and strong odor. Noted since starting trulicity, worsening 5 days ago. States no appetite and has not been drinking water. States went form 5-6 bottles a day, to one. Frequency: 5 days ago Pertinent Negatives: Patient denies Dysuria, dizziness,dry mouth, flank pain. States is voiding usual amount, denies decreased urine output.  Disposition: '[]'$ ED /'[]'$ Urgent Care (no appt availability in office) / '[x]'$ Appointment(In office/virtual)/ '[]'$  [place] [facility]/ '[]'$ Home Care/ '[]'$ Refused Recommended Disposition /'[]'$ [place] [facility]/ '[]'$  Follow-up with PCP Additional Notes: Appt secured first available. Care advise provided.  Reiterated need to stay hydrated. Advised ED for worsening symptoms, reviewed S/S dehydration.  Reason for Disposition  Bad or foul-smelling urine    "Strong smell"  Answer Assessment - Initial Assessment Questions 1. SYMPTOM: "What's the main symptom you're concerned about?" (e.g., frequency, incontinence)     Urine darker 2. ONSET: "When did the  start?"    5 days ago 3. PAIN: "Is there any pain?" If Yes, ask: "How bad is it?" (Scale: 1-10; mild, moderate, severe)     no 4. CAUSE: "What do you think is causing the symptoms?"     Trulicity 5. OTHER SYMPTOMS: "Do you have any other symptoms?" (e.g., fever, flank pain, blood in urine, pain with urination)     Malodorous  Protocols used: Urinary Symptoms-A-AH

## 2021-11-12 ENCOUNTER — Other Ambulatory Visit: Payer: Self-pay

## 2021-11-12 ENCOUNTER — Other Ambulatory Visit (HOSPITAL_COMMUNITY): Payer: Self-pay

## 2021-11-19 ENCOUNTER — Other Ambulatory Visit (HOSPITAL_COMMUNITY): Payer: Self-pay

## 2021-11-20 ENCOUNTER — Other Ambulatory Visit: Payer: Self-pay

## 2021-11-20 ENCOUNTER — Other Ambulatory Visit (HOSPITAL_COMMUNITY): Payer: Self-pay

## 2021-11-21 ENCOUNTER — Other Ambulatory Visit (HOSPITAL_COMMUNITY): Payer: Self-pay

## 2021-11-21 ENCOUNTER — Other Ambulatory Visit: Payer: Self-pay | Admitting: Nurse Practitioner

## 2021-11-21 MED ORDER — ETONOGESTREL-ETHINYL ESTRADIOL 0.12-0.015 MG/24HR VA RING
VAGINAL_RING | VAGINAL | 12 refills | Status: DC
  Filled 2021-11-21: qty 1, 28d supply, fill #0
  Filled 2021-12-19: qty 1, 28d supply, fill #1
  Filled 2022-01-29: qty 1, 28d supply, fill #2
  Filled 2022-02-20 – 2022-03-02 (×2): qty 1, 28d supply, fill #3
  Filled 2022-04-06: qty 1, 28d supply, fill #4
  Filled 2022-05-04: qty 1, 28d supply, fill #5
  Filled 2022-06-03 – 2022-06-05 (×3): qty 1, 28d supply, fill #6
  Filled 2022-07-20: qty 1, 28d supply, fill #7
  Filled 2022-08-25: qty 1, 28d supply, fill #0
  Filled 2022-09-20: qty 1, 28d supply, fill #1
  Filled 2022-10-22: qty 1, 28d supply, fill #0

## 2021-12-01 ENCOUNTER — Other Ambulatory Visit: Payer: Self-pay

## 2021-12-03 ENCOUNTER — Other Ambulatory Visit: Payer: Self-pay

## 2021-12-04 ENCOUNTER — Other Ambulatory Visit: Payer: Self-pay

## 2021-12-10 ENCOUNTER — Ambulatory Visit: Payer: Medicaid Other | Admitting: Physician Assistant

## 2021-12-19 ENCOUNTER — Other Ambulatory Visit (HOSPITAL_COMMUNITY): Payer: Self-pay

## 2022-01-01 ENCOUNTER — Other Ambulatory Visit: Payer: Self-pay

## 2022-01-01 ENCOUNTER — Other Ambulatory Visit: Payer: Self-pay | Admitting: Family Medicine

## 2022-01-01 MED ORDER — ROSUVASTATIN CALCIUM 20 MG PO TABS
ORAL_TABLET | Freq: Every day | ORAL | 0 refills | Status: DC
Start: 2022-01-01 — End: 2022-01-27
  Filled 2022-01-01: qty 30, 30d supply, fill #0

## 2022-01-01 NOTE — Telephone Encounter (Signed)
Requested Prescriptions  Pending Prescriptions Disp Refills  . rosuvastatin (CRESTOR) 20 MG tablet 30 tablet 0    Sig: TAKE 1 TABLET (20 MG TOTAL) BY MOUTH DAILY.     Cardiovascular:  Antilipid - Statins 2 Failed - 01/01/2022 10:09 AM      Failed - Lipid Panel in normal range within the last 12 months    Cholesterol, Total  Date Value Ref Range Status  01/03/2021 190 100 - 199 mg/dL Final   LDL Chol Calc (NIH)  Date Value Ref Range Status  01/03/2021 107 (H) 0 - 99 mg/dL Final   HDL  Date Value Ref Range Status  01/03/2021 48 >39 mg/dL Final   Triglycerides  Date Value Ref Range Status  01/03/2021 205 (H) 0 - 149 mg/dL Final         Passed - Cr in normal range and within 360 days    Creatinine  Date Value Ref Range Status  08/23/2019 0.74 0.44 - 1.00 mg/dL Final   Creat  Date Value Ref Range Status  07/06/2016 0.71 0.50 - 1.10 mg/dL Final   Creatinine, Ser  Date Value Ref Range Status  08/13/2021 0.63 0.57 - 1.00 mg/dL Final   Creatinine, Urine  Date Value Ref Range Status  07/06/2016 122 20 - 320 mg/dL Final         Passed - Patient is not pregnant      Passed - Valid encounter within last 12 months    Recent Outpatient Visits          4 months ago Essential hypertension   [place], Vernia Buff, NP   12 months ago Essential hypertension   [place], Clyde [place], MD   2 years ago Type 2 diabetes mellitus with hyperglycemia, without long-term current use of insulin [facility])   [place], [place] W, NP   2 years ago Type 2 diabetes mellitus with hyperglycemia, without long-term current use of insulin [facility])   [place], [place], [place]   2 years ago Type 2 diabetes mellitus with hyperglycemia, without long-term current use of insulin [facility])   [place], Vernia Buff, NP

## 2022-01-02 ENCOUNTER — Other Ambulatory Visit: Payer: Self-pay

## 2022-01-26 ENCOUNTER — Ambulatory Visit: Payer: Self-pay

## 2022-01-26 NOTE — Telephone Encounter (Signed)
Patient called, left VM to return the call to the office to discuss symptoms with a nurse.  Summary: Uterine fibroids and possible bacterial infection?   Patient called in stating she was seen at [facility] for food poisoning and her white blood cells were high. She had a scan that showed she has uterine fibroids and the patient is worried about a bacterial infection. She is currently scheduled for an appointment on September 19 with her provider and on the wait list. Please assist patient further.

## 2022-01-26 NOTE — Telephone Encounter (Signed)
Patient will f/u for [facility] clinic appt.  Aware to be at office at [place] and be seen as a standby.

## 2022-01-26 NOTE — Telephone Encounter (Signed)
  Chief Complaint: bacterial vaginosis Symptoms: vaginal discharge, itching, slight odor, dysuria Frequency: several weeks Pertinent Negatives: NA Disposition: '[]'$ ED /'[]'$ Urgent Care (no appt availability in office) / '[]'$ Appointment(In office/virtual)/ '[]'$  [place] [facility]/ '[]'$ Home Care/ '[]'$ Refused Recommended Disposition /'[x]'$ [place] [facility]/ '[]'$  Follow-up with PCP Additional Notes: pt went to ED on 01/23/22 and was seen but not for BV. She was wanting to be seen sooner than appt on 02/17/22. Advised pt she can go to mobile unit on lunch break tomorrow or can go to UC closest to her home. Pt states she will try mobile unit.   Reason for Disposition  [1] Symptoms of a yeast infection (i.e., itchy, white discharge, not bad smelling) AND [2] not improved > 3 days following Care Advice  Answer Assessment - Initial Assessment Questions 1. SYMPTOM: "What's the main symptom you're concerned about?" (e.g., pain, itching, dryness)     Discharge, dysuria, itching  2. LOCATION: "Where is the  sx located?" (e.g., inside/outside, left/right)     Vaginally  3. ONSET: "When did the  sx  start?"     Couple of weeks 4. PAIN: "Is there any pain?" If Yes, ask: "How bad is it?" (Scale: 1-10; mild, moderate, severe)   -  MILD (1-3): Doesn't interfere with normal activities.    -  MODERATE (4-7): Interferes with normal activities (e.g., work or school) or awakens from sleep.     -  SEVERE (8-10): Excruciating pain, unable to do any normal activities.      5. ITCHING: "Is there any itching?" If Yes, ask: "How bad is it?" (Scale: 1-10; mild, moderate, severe)     Mild to moderate  6. CAUSE: "What do you think is causing the discharge?" "Have you had the same problem before? What happened then?"     BV 7. OTHER SYMPTOMS: "Do you have any other symptoms?" (e.g., fever, itching, vaginal bleeding, pain with urination, injury to genital area, vaginal foreign body)  Protocols used: Vaginal Symptoms-A-AH

## 2022-01-27 ENCOUNTER — Other Ambulatory Visit: Payer: Self-pay | Admitting: Critical Care Medicine

## 2022-01-27 ENCOUNTER — Ambulatory Visit: Payer: 59 | Attending: Nurse Practitioner | Admitting: Critical Care Medicine

## 2022-01-27 ENCOUNTER — Encounter: Payer: Self-pay | Admitting: Critical Care Medicine

## 2022-01-27 ENCOUNTER — Other Ambulatory Visit (HOSPITAL_COMMUNITY)
Admission: RE | Admit: 2022-01-27 | Discharge: 2022-01-27 | Disposition: A | Discharge status: Home / Self Care | Payer: 59 | Source: Ambulatory Visit | Attending: Nurse Practitioner | Admitting: Nurse Practitioner

## 2022-01-27 VITALS — BP 126/84 | HR 92 | Ht 62.0 in | Wt 168.0 lb

## 2022-01-27 DIAGNOSIS — E785 Hyperlipidemia, unspecified: Secondary | ICD-10-CM

## 2022-01-27 DIAGNOSIS — K529 Noninfective gastroenteritis and colitis, unspecified: Secondary | ICD-10-CM | POA: Diagnosis not present

## 2022-01-27 DIAGNOSIS — E1165 Type 2 diabetes mellitus with hyperglycemia: Secondary | ICD-10-CM | POA: Diagnosis not present

## 2022-01-27 DIAGNOSIS — I10 Essential (primary) hypertension: Secondary | ICD-10-CM

## 2022-01-27 DIAGNOSIS — N898 Other specified noninflammatory disorders of vagina: Secondary | ICD-10-CM | POA: Insufficient documentation

## 2022-01-27 DIAGNOSIS — R300 Dysuria: Secondary | ICD-10-CM

## 2022-01-27 DIAGNOSIS — E1169 Type 2 diabetes mellitus with other specified complication: Secondary | ICD-10-CM

## 2022-01-27 MED ORDER — AMLODIPINE BESYLATE 10 MG PO TABS
10.0000 mg | ORAL_TABLET | Freq: Every day | ORAL | 1 refills | Status: DC
  Filled 2022-01-28 – 2022-03-02 (×3): qty 30, 30d supply, fill #0
  Filled 2022-04-06: qty 30, 30d supply, fill #1
  Filled 2022-04-20 – 2022-05-04 (×2): qty 30, 30d supply, fill #2
  Filled 2022-06-03 (×2): qty 30, 30d supply, fill #3
  Filled 2022-07-20: qty 30, 30d supply, fill #4
  Filled 2022-08-25: qty 30, 30d supply, fill #5

## 2022-01-27 MED ORDER — ROSUVASTATIN CALCIUM 20 MG PO TABS
20.0000 mg | ORAL_TABLET | Freq: Every day | ORAL | 1 refills | Status: DC
  Filled 2022-01-28 – 2022-03-02 (×3): qty 30, 30d supply, fill #0
  Filled 2022-04-06: qty 30, 30d supply, fill #1
  Filled 2022-04-20 – 2022-05-04 (×2): qty 30, 30d supply, fill #2
  Filled 2022-06-03 (×2): qty 30, 30d supply, fill #3
  Filled 2022-07-20: qty 30, 30d supply, fill #4
  Filled 2022-08-25: qty 30, 30d supply, fill #5

## 2022-01-27 MED ORDER — KOMBIGLYZE XR 5-1000 MG PO TB24
1.0000 | ORAL_TABLET | Freq: Every day | ORAL | 1 refills | Status: DC
  Filled 2022-01-28 – 2022-03-02 (×3): qty 30, 30d supply, fill #0
  Filled 2022-04-06: qty 30, 30d supply, fill #1
  Filled 2022-04-20 – 2022-05-04 (×2): qty 30, 30d supply, fill #2
  Filled 2022-06-03: qty 30, 30d supply, fill #3

## 2022-01-27 MED ORDER — TRULICITY 0.75 MG/0.5ML ~~LOC~~ SOAJ
0.7500 mg | SUBCUTANEOUS | 1 refills | Status: DC
  Filled 2022-01-28: qty 2, 28d supply, fill #0
  Filled 2022-03-02: qty 2, 28d supply, fill #1

## 2022-01-27 MED ORDER — PAROXETINE HCL 20 MG PO TABS
20.0000 mg | ORAL_TABLET | Freq: Every day | ORAL | 0 refills | Status: DC
  Filled 2022-01-28: qty 30, 30d supply, fill #0
  Filled 2022-06-03: qty 90, 90d supply, fill #0
  Filled 2022-06-03: qty 30, 30d supply, fill #0

## 2022-01-27 NOTE — Patient Instructions (Signed)
Referral to gastroenterology will be made  Follow the diet on the lifestyle medicine handout given to you  Cervical vaginal swab and urine culture and urinalysis was obtained  Screening for protein in the urine was obtained for your diabetes  Refills on medications sent to our pharmacy  Please show your new insurance card to the front desk on checkout  Return to see Ms. Fleming 2 months

## 2022-01-27 NOTE — Assessment & Plan Note (Signed)
Blood pressure well controlled at this time we will refill amlodipine

## 2022-01-27 NOTE — Assessment & Plan Note (Signed)
Obtain urine culture urinalysis

## 2022-01-27 NOTE — Assessment & Plan Note (Signed)
Type 2 diabetes with ongoing hyperglycemia continue combination oral therapy  The following Lifestyle Medicine recommendations according to [employer] [facility]) were discussed and offered to patient who agrees to start the journey:  A. Whole Foods, Plant-based plate comprising of fruits and vegetables, plant-based proteins, whole-grain carbohydrates was discussed in detail with the patient.   A list for source of those nutrients were also provided to the patient.  Patient will use only water or unsweetened tea for hydration. B.  The need to stay away from risky substances including alcohol, smoking; obtaining 7 to 9 hours of restorative sleep, at least 150 minutes of moderate intensity exercise weekly, the importance of healthy social connections,  and stress reduction techniques were discussed. C.  A full color page of  Calorie density of various food groups per pound showing examples of each food groups was provided to the patient.

## 2022-01-27 NOTE — Assessment & Plan Note (Signed)
Obtain cervical vaginal swab

## 2022-01-27 NOTE — Assessment & Plan Note (Signed)
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 Whole Foods, Plant-based plate comprising of fruits and vegetables, plant-based proteins, whole-grain carbohydrates was discussed in detail with the patient.   A list for source of those nutrients were also provided to the patient.  Patient will use only water or unsweetened tea for hydration. B.  The need to stay away from risky substances including alcohol, smoking; obtaining 7 to 9 hours of restorative sleep, at least 150 minutes of moderate intensity exercise weekly, the importance of healthy social connections,  and stress reduction techniques were discussed. C.  A full color page of  Calorie density of various food groups per pound showing examples of each food groups was provided to the patient.

## 2022-01-27 NOTE — Progress Notes (Signed)
Acute Office Visit  Subjective:     Patient ID: Samantha [name], female    DOB: 09/17/1980, 41 y.o.   MRN: [id number]  Chief Complaint  Patient presents with   Vaginal Itching    HPI Patient is in today for work in visit for vaginal itching and ER follow-up.  Patient was in the ER this past week in [place] and review of records shows she had colitis in the right side of the colon and minimal fibroids.  The patient's been having pain with each menstrual cycle and increased dark blood from the vagina area.  She is also had a vaginal discharge is white.  There is no blood from the vagina in between periods  Patient does not have any abdominal pain now.  She does have some dysuria and urinary frequency.  Patient is due a urinary microalbumin.  On arrival blood pressure 126/84.  Patient screening with the Pis[facility] system see below   Upstream - 01/27/22 9242       Pregnancy Intention Screening   Does the patient want to become pregnant in the next year? No    Does the patient's partner want to become pregnant in the next year? No    Would the patient like to discuss contraceptive options today? N/A      Contraception Wrap Up   Current Method Vaginal Ring    Contraception Counseling Provided Yes            The pregnancy intention screening data noted above was reviewed. Potential methods of contraception were discussed. The patient elected to proceed with stay with nuvaring  Review of Systems  Constitutional:  Negative for chills, diaphoresis, fever, malaise/fatigue and weight loss.  HENT:  Negative for congestion, ear discharge, ear pain, hearing loss, nosebleeds, sore throat and tinnitus.   Eyes:  Negative for blurred vision, double vision, photophobia and discharge.  Respiratory:  Negative for cough, hemoptysis, sputum production, shortness of breath, wheezing and stridor.        No excess mucus  Cardiovascular:  Negative for chest pain, palpitations, orthopnea,  claudication, leg swelling and PND.  Gastrointestinal:  Negative for abdominal pain, blood in stool, constipation, diarrhea, heartburn, melena, nausea and vomiting.  Genitourinary:  Positive for dysuria, frequency and urgency. Negative for flank pain and hematuria.  Musculoskeletal:  Negative for back pain, falls, joint pain, myalgias and neck pain.  Skin:  Negative for itching and rash.  Neurological:  Negative for dizziness, tingling, tremors, sensory change, speech change, focal weakness, seizures, loss of consciousness, weakness and headaches.  Endo/Heme/Allergies:  Negative for environmental allergies and polydipsia. Does not bruise/bleed easily.  Psychiatric/Behavioral:  Negative for depression, hallucinations, memory loss, substance abuse and suicidal ideas. The patient is not nervous/anxious and does not have insomnia.   All other systems reviewed and are negative.       Objective:    BP 126/84 (BP Location: Left Arm, Cuff Size: Normal)   Pulse 92   Ht '5\' 2"'$  (1.575 m)   Wt 168 lb (76.2 kg)   SpO2 98%   BMI 30.73 kg/m    Physical Exam Vitals reviewed.  Constitutional:      Appearance: Normal appearance. She is well-developed. She is obese. She is not diaphoretic.  HENT:     Head: Normocephalic and atraumatic.     Nose: No nasal deformity, septal deviation, mucosal edema or rhinorrhea.     Right Sinus: No maxillary sinus tenderness or frontal sinus tenderness.     Left  Sinus: No maxillary sinus tenderness or frontal sinus tenderness.     Mouth/Throat:     Pharynx: No oropharyngeal exudate.  Eyes:     General: No scleral icterus.    Conjunctiva/sclera: Conjunctivae normal.     Pupils: Pupils are equal, round, and reactive to light.  Neck:     Thyroid: No thyromegaly.     Vascular: No carotid bruit or JVD.     Trachea: Trachea normal. No tracheal tenderness or tracheal deviation.  Cardiovascular:     Rate and Rhythm: Normal rate and regular rhythm.     Chest Wall: PMI  is not displaced.     Pulses: Normal pulses. No decreased pulses.     Heart sounds: Normal heart sounds, S1 normal and S2 normal. Heart sounds not distant. No murmur heard.    No systolic murmur is present.     No diastolic murmur is present.     No friction rub. No gallop. No S3 or S4 sounds.  Pulmonary:     Effort: No tachypnea, accessory muscle usage or respiratory distress.     Breath sounds: No stridor. No decreased breath sounds, wheezing, rhonchi or rales.  Chest:     Chest wall: No tenderness.  Abdominal:     General: Bowel sounds are normal. There is no distension.     Palpations: Abdomen is soft. Abdomen is not rigid.     Tenderness: There is no abdominal tenderness. There is no guarding or rebound.  Musculoskeletal:        General: Normal range of motion.     Cervical back: Normal range of motion and neck supple. No edema, erythema or rigidity. No muscular tenderness. Normal range of motion.  Lymphadenopathy:     Head:     Right side of head: No submental or submandibular adenopathy.     Left side of head: No submental or submandibular adenopathy.     Cervical: No cervical adenopathy.  Skin:    General: Skin is warm and dry.     Coloration: Skin is not pale.     Findings: No rash.     Nails: There is no clubbing.  Neurological:     Mental Status: She is alert and oriented to person, place, and time.     Sensory: No sensory deficit.  Psychiatric:        Speech: Speech normal.        Behavior: Behavior normal.     No results found for any visits on 01/27/22.      Assessment & Plan:   Problem List Items Addressed This Visit       Cardiovascular and Mediastinum   Essential hypertension    Blood pressure well controlled at this time we will refill amlodipine      Relevant Medications   amLODipine (NORVASC) 10 MG tablet   rosuvastatin (CRESTOR) 20 MG tablet     Digestive   Colitis   Relevant Orders   Ambulatory referral to Gastroenterology   Chronic  diarrhea    Chronic diarrhea and colitis on CT abdomen  Refer to gastroenterology  The following Lifestyle Medicine recommendations according to [employer] of Lifestyle Medicine [facility]) were discussed and offered to patient who agrees to start the journey:  A. Whole Foods, Plant-based plate comprising of fruits and vegetables, plant-based proteins, whole-grain carbohydrates was discussed in detail with the patient.   A list for source of those nutrients were also provided to the patient.  Patient will use only water  or unsweetened tea for hydration. B.  The need to stay away from risky substances including alcohol, smoking; obtaining 7 to 9 hours of restorative sleep, at least 150 minutes of moderate intensity exercise weekly, the importance of healthy social connections,  and stress reduction techniques were discussed. C.  A full color page of  Calorie density of various food groups per pound showing examples of each food groups was provided to the patient.       Relevant Orders   Ambulatory referral to Gastroenterology     Endocrine   Type 2 diabetes mellitus with hyperglycemia ([place])    Type 2 diabetes with ongoing hyperglycemia continue combination oral therapy  The following Lifestyle Medicine recommendations according to [facility]) were discussed and offered to patient who agrees to start the journey:  A. Whole Foods, Plant-based plate comprising of fruits and vegetables, plant-based proteins, whole-grain carbohydrates was discussed in detail with the patient.   A list for source of those nutrients were also provided to the patient.  Patient will use only water or unsweetened tea for hydration. B.  The need to stay away from risky substances including alcohol, smoking; obtaining 7 to 9 hours of restorative sleep, at least 150 minutes of moderate intensity exercise weekly, the importance of healthy social connections,  and stress reduction techniques  were discussed. C.  A full color page of  Calorie density of various food groups per pound showing examples of each food groups was provided to the patient.       Relevant Medications   Dulaglutide (TRULICITY) 9.03 ES/9.2ZR SOPN   rosuvastatin (CRESTOR) 20 MG tablet   Saxagliptin-Metformin (KOMBIGLYZE XR) 09-998 MG TB24   Other Relevant Orders   Urine microalbumin-creatinine with uACR   Hyperlipidemia associated with type 2 diabetes mellitus (HCC)    Continue with lipid therapy      Relevant Medications   amLODipine (NORVASC) 10 MG tablet   Dulaglutide (TRULICITY) 0.07 MA/2.6JF SOPN   rosuvastatin (CRESTOR) 20 MG tablet   Saxagliptin-Metformin (KOMBIGLYZE XR) 09-998 MG TB24     Genitourinary   Vaginal itching - Primary    Obtain cervical vaginal swab      Relevant Orders   Cervicovaginal ancillary only     Other   Dysuria    Obtain urine culture urinalysis      Relevant Orders   Urinalysis   Urine Culture    Meds ordered this encounter  Medications   amLODipine (NORVASC) 10 MG tablet    Sig: Take 1 tablet (10 mg total) by mouth daily.    Dispense:  90 tablet    Refill:  1   Dulaglutide (TRULICITY) 3.54 TG/2.5WL SOPN    Sig: Inject 0.75 mg into the skin once a week.    Dispense:  3 mL    Refill:  1   PARoxetine (PAXIL) 20 MG tablet    Sig: Take 1 tablet (20 mg total) by mouth daily.    Dispense:  90 tablet    Refill:  0   rosuvastatin (CRESTOR) 20 MG tablet    Sig: TAKE 1 TABLET (20 MG TOTAL) BY MOUTH DAILY.    Dispense:  90 tablet    Refill:  1   Saxagliptin-Metformin (KOMBIGLYZE XR) 09-998 MG TB24    Sig: Take 1 tablet by mouth daily.    Dispense:  90 tablet    Refill:  1  38 minutes spent complex decision making high  Return in about 2 months (  around 03/29/2022) for htn, diabetes.  Asencion Noble, MD

## 2022-01-27 NOTE — Assessment & Plan Note (Signed)
Continue with lipid therapy

## 2022-01-28 ENCOUNTER — Other Ambulatory Visit: Payer: Self-pay | Admitting: Critical Care Medicine

## 2022-01-28 ENCOUNTER — Encounter: Payer: Self-pay | Admitting: Nurse Practitioner

## 2022-01-28 ENCOUNTER — Other Ambulatory Visit: Payer: Self-pay

## 2022-01-28 LAB — CERVICOVAGINAL ANCILLARY ONLY
Bacterial Vaginitis (gardnerella): POSITIVE — AB
Candida Glabrata: NEGATIVE
Candida Vaginitis: POSITIVE — AB
Chlamydia: NEGATIVE
Comment: NEGATIVE
Comment: NEGATIVE
Comment: NEGATIVE
Comment: NEGATIVE
Comment: NEGATIVE
Comment: NORMAL
Neisseria Gonorrhea: NEGATIVE
Trichomonas: NEGATIVE

## 2022-01-28 MED ORDER — ONDANSETRON 4 MG PO TBDP
ORAL_TABLET | ORAL | 1 refills | Status: DC
  Filled 2022-01-28 – 2022-06-03 (×2): qty 12, 7d supply, fill #0

## 2022-01-28 MED ORDER — METRONIDAZOLE 500 MG PO TABS
500.0000 mg | ORAL_TABLET | Freq: Three times a day (TID) | ORAL | 0 refills | Status: AC
  Filled 2022-01-28: qty 15, 5d supply, fill #0

## 2022-01-28 MED ORDER — FLUCONAZOLE 150 MG PO TABS
150.0000 mg | ORAL_TABLET | Freq: Once | ORAL | 0 refills | Status: AC
Start: 2022-01-28 — End: 2022-01-31
  Filled 2022-01-28: qty 1, 1d supply, fill #0

## 2022-01-28 NOTE — Progress Notes (Signed)
Let pt know she has bacteria and yeast in vagina  I have sent an ABX and a yeast medication

## 2022-01-29 ENCOUNTER — Other Ambulatory Visit: Payer: Self-pay

## 2022-01-29 LAB — MICROALBUMIN / CREATININE URINE RATIO
Creatinine, Urine: 133.3 mg/dL
Microalb/Creat Ratio: 16 mg/g creat (ref 0–29)
Microalbumin, Urine: 20.8 ug/mL

## 2022-01-29 LAB — URINALYSIS
Bilirubin, UA: NEGATIVE
Glucose, UA: NEGATIVE
Nitrite, UA: NEGATIVE
Specific Gravity, UA: 1.017 (ref 1.005–1.030)
Urobilinogen, Ur: 0.2 mg/dL (ref 0.2–1.0)
pH, UA: 5.5 (ref 5.0–7.5)

## 2022-01-30 ENCOUNTER — Other Ambulatory Visit: Payer: Self-pay

## 2022-01-30 ENCOUNTER — Encounter: Payer: Self-pay | Admitting: Nurse Practitioner

## 2022-01-30 ENCOUNTER — Telehealth: Payer: Self-pay

## 2022-01-30 ENCOUNTER — Ambulatory Visit: Payer: 59 | Attending: Nurse Practitioner | Admitting: Nurse Practitioner

## 2022-01-30 VITALS — BP 133/87 | HR 89 | Temp 98.1°F | Ht 63.0 in | Wt 168.0 lb

## 2022-01-30 DIAGNOSIS — I10 Essential (primary) hypertension: Secondary | ICD-10-CM | POA: Diagnosis not present

## 2022-01-30 DIAGNOSIS — D259 Leiomyoma of uterus, unspecified: Secondary | ICD-10-CM

## 2022-01-30 DIAGNOSIS — E1165 Type 2 diabetes mellitus with hyperglycemia: Secondary | ICD-10-CM

## 2022-01-30 DIAGNOSIS — D72829 Elevated white blood cell count, unspecified: Secondary | ICD-10-CM | POA: Diagnosis not present

## 2022-01-30 DIAGNOSIS — N3000 Acute cystitis without hematuria: Secondary | ICD-10-CM

## 2022-01-30 LAB — POCT GLYCOSYLATED HEMOGLOBIN (HGB A1C): Hemoglobin A1C: 7.2 % — AB (ref 4.0–5.6)

## 2022-01-30 LAB — URINE CULTURE

## 2022-01-30 MED ORDER — SULFAMETHOXAZOLE-TRIMETHOPRIM 800-160 MG PO TABS
1.0000 | ORAL_TABLET | Freq: Two times a day (BID) | ORAL | 0 refills | Status: AC
  Filled 2022-01-30: qty 6, 3d supply, fill #0

## 2022-01-30 MED ORDER — SULFAMETHOXAZOLE-TRIMETHOPRIM 800-160 MG PO TABS: 1.0000 | ORAL_TABLET | Freq: Two times a day (BID) | ORAL | 0 refills | Status: DC

## 2022-01-30 NOTE — Telephone Encounter (Signed)
Pt was called and is aware of results, DOB was confirmed.  ?

## 2022-01-30 NOTE — Progress Notes (Signed)
Assessment & Plan:  Samantha [name] was seen today for fibroids and diabetes.  Diagnoses and all orders for this visit:  Type 2 diabetes mellitus with hyperglycemia, without long-term current use of insulin (HCC) -     POCT glycosylated hemoglobin (Hb A1C) Continue blood sugar control as discussed in office today, low carbohydrate diet, and regular physical exercise as tolerated, 150 minutes per week (30 min each day, 5 days per week, or 50 min 3 days per week). Keep blood sugar logs with fasting goal of 90-130 mg/dl, post prandial (after you eat) less than 180.  For Hypoglycemia: BS <60 and Hyperglycemia BS >400; contact the clinic ASAP. Annual eye exams and foot exams are recommended.   Acute cystitis without hematuria -     sulfamethoxazole-trimethoprim (BACTRIM DS) 800-160 MG tablet; Take 1 tablet by mouth 2 (two) times daily for 3 days.  Primary hypertension Continue all antihypertensives as prescribed.  Reminded to bring in blood pressure log for follow  up appointment.  RECOMMENDATIONS: DASH/Mediterranean Diets are healthier choices for HTN.    Uterine leiomyoma, unspecified location -     Ambulatory referral to Gynecology -     US PELVIC COMPLETE WITH TRANSVAGINAL; Future  Leukocytosis, unspecified type -     CBC with Differential    Patient has been counseled on age-appropriate routine health concerns for screening and prevention. These are reviewed and up-to-date. Referrals have been placed accordingly. Immunizations are up-to-date or declined.    Subjective:   Chief Complaint  Patient presents with   Fibroids   Diabetes   HPI Samantha [name] 41 y.o. female presents to office today for referral to GYN for incidental uterine enlargement seen on CT abdomen pelvis last week.   She was seen earlier this week for vaginitis and treated flagyl and diflucan. Urine came back positive for klebs and she was treated today with script sent for bactrim  She states she read her  recent mychart visit notes and was unhappy with what was charted by me in the notes for that day 08-13-2021. States she almost did not come for her visit today and was thinking about finding a new provider.   DM2 Not quite at goal but significantly improved. She is currently taking trulicity 6.06 mg weekly. Declines increasing to 1.55m weekly. Continues on kombiglyze XR 09-998 mg daily. Weight is down 16lbs since March.  Lab Results  Component Value Date   HGBA1C 7.2 (A) 01/30/2022    Lab Results  Component Value Date   HGBA1C 11.9 (A) 08/13/2021  LDL not at goal with crestor 20 mg daily.  Lab Results  Component Value Date   LDLCALC 107 (H) 01/03/2021      HTN Blood pressure well controlled. She is currently prescribed amlodipine 10 mg daily, zestoretic 20-25 mg daily.  BP Readings from Last 3 Encounters:  01/30/22 133/87  01/27/22 126/84  08/13/21 (!) 131/91      Review of Systems  Constitutional:  Negative for fever, malaise/fatigue and weight loss.  HENT: Negative.  Negative for nosebleeds.   Eyes: Negative.  Negative for blurred vision, double vision and photophobia.  Respiratory: Negative.  Negative for cough and shortness of breath.   Cardiovascular: Negative.  Negative for chest pain, palpitations and leg swelling.  Gastrointestinal: Negative.  Negative for heartburn, nausea and vomiting.  Musculoskeletal: Negative.  Negative for myalgias.  Neurological: Negative.  Negative for dizziness, focal weakness, seizures and headaches.  Psychiatric/Behavioral: Negative.  Negative for suicidal ideas.  Past Medical History:  Diagnosis Date   Diabetes mellitus without complication ([place])    Hyperlipidemia    Hypertension     History reviewed. No pertinent surgical history.  Family History  Problem Relation Age of Onset   Cancer Father     Social History Reviewed with no changes to be made today.   Outpatient Medications Prior to Visit  Medication Sig Dispense  Refill   albuterol (VENTOLIN HFA) 108 (90 Base) MCG/ACT inhaler Inhale 2 puffs into the lungs every 6 (six) hours as needed for wheezing or shortness of breath. 8.5 g 2   amLODipine (NORVASC) 10 MG tablet Take 1 tablet (10 mg total) by mouth daily. 90 tablet 1   blood glucose meter kit and supplies Dispense based on patient and insurance preference. Use up to two times daily as directed. ICD-10 E11.65. 1 each 0   Blood Pressure Monitor DEVI Please provide patient with insurance approved blood pressure monitor 1 Device 0   busPIRone (BUSPAR) 5 MG tablet Take 2 tablets (10 mg total) by mouth 3 (three) times daily as needed. 60 tablet 1   Dulaglutide (TRULICITY) 7.10 GY/6.9SW SOPN Inject 0.75 mg into the skin once a week. 2 mL 1   etonogestrel-ethinyl estradiol (NUVARING) 0.12-0.015 MG/24HR vaginal ring Insert vaginally and leave in place for 3 consecutive weeks, then remove for 1 week. 1 each 12   fluconazole (DIFLUCAN) 150 MG tablet Take 1 tablet (150 mg total) by mouth once for 1 dose. 1 tablet 0   lisinopril-hydrochlorothiazide (ZESTORETIC) 20-25 MG tablet Take 1 tablet by mouth daily. 90 tablet 3   metroNIDAZOLE (FLAGYL) 500 MG tablet Take 1 tablet (500 mg total) by mouth 3 (three) times daily for 5 days. 15 tablet 0   Misc. Devices MISC Please provide patient with insurance approved blood pressure monitor 1 each 0   ondansetron (ZOFRAN-ODT) 4 MG disintegrating tablet Dissolve 1 tablet by mouth every 8 hour for up to 7 days as needed for nausea. 12 tablet 1   PARoxetine (PAXIL) 20 MG tablet Take 1 tablet (20 mg total) by mouth daily. 90 tablet 0   rosuvastatin (CRESTOR) 20 MG tablet TAKE 1 TABLET (20 MG TOTAL) BY MOUTH DAILY. 90 tablet 1   Saxagliptin-Metformin (KOMBIGLYZE XR) 09-998 MG TB24 Take 1 tablet by mouth daily. 90 tablet 1   No facility-administered medications prior to visit.    No Known Allergies     Objective:    BP 133/87   Pulse 89   Temp 98.1 F (36.7 C) (Oral)   Ht 5'  3" (1.6 m)   Wt 168 lb (76.2 kg) Comment: Pt would like a recheck on weight. Explained the scale has been calibrated  since last  O.V. Current weight as of 01/30/2022 ib 180lbs  SpO2 100%   BMI 29.76 kg/m  Wt Readings from Last 3 Encounters:  01/30/22 168 lb (76.2 kg)  01/27/22 168 lb (76.2 kg)  08/13/21 184 lb (83.5 kg)    Physical Exam Vitals and nursing note reviewed.  Constitutional:      Appearance: She is well-developed.  HENT:     Head: Normocephalic and atraumatic.  Cardiovascular:     Rate and Rhythm: Normal rate and regular rhythm.     Heart sounds: Normal heart sounds. No murmur heard.    No friction rub. No gallop.  Pulmonary:     Effort: Pulmonary effort is normal. No tachypnea or respiratory distress.     Breath sounds: Normal breath sounds.  No decreased breath sounds, wheezing, rhonchi or rales.  Chest:     Chest wall: No tenderness.  Abdominal:     General: Bowel sounds are normal.     Palpations: Abdomen is soft.  Musculoskeletal:        General: Normal range of motion.     Cervical back: Normal range of motion.  Skin:    General: Skin is warm and dry.  Neurological:     Mental Status: She is alert and oriented to person, place, and time.     Coordination: Coordination normal.  Psychiatric:        Behavior: Behavior normal. Behavior is cooperative.        Thought Content: Thought content normal.        Judgment: Judgment normal.          Patient has been counseled extensively about nutrition and exercise as well as the importance of adherence with medications and regular follow-up. The patient was given clear instructions to go to ER or return to medical center if symptoms don't improve, worsen or new problems develop. The patient verbalized understanding.   Follow-up: Return in about 3 months (around 05/01/2022) for HTN/HPL/DM.   [name], FNP-BC [facility], [place]   01/30/2022, 4:44  PM

## 2022-01-30 NOTE — Telephone Encounter (Signed)
-----   Message from Elsie Stain, MD sent at 01/28/2022  5:35 PM EDT ----- Let pt know she has bacteria and yeast in vagina  I have sent an ABX and a yeast medication

## 2022-01-31 LAB — CBC WITH DIFFERENTIAL/PLATELET
Basophils Absolute: 0.1 10*3/uL (ref 0.0–0.2)
Basos: 1 %
EOS (ABSOLUTE): 0.1 10*3/uL (ref 0.0–0.4)
Eos: 1 %
Hematocrit: 39.7 % (ref 34.0–46.6)
Hemoglobin: 13.5 g/dL (ref 11.1–15.9)
Immature Grans (Abs): 0.1 10*3/uL (ref 0.0–0.1)
Immature Granulocytes: 1 %
Lymphocytes Absolute: 3.8 10*3/uL — ABNORMAL HIGH (ref 0.7–3.1)
Lymphs: 26 %
MCH: 29.7 pg (ref 26.6–33.0)
MCHC: 34 g/dL (ref 31.5–35.7)
MCV: 87 fL (ref 79–97)
Monocytes Absolute: 0.9 10*3/uL (ref 0.1–0.9)
Monocytes: 6 %
Neutrophils Absolute: 9.9 10*3/uL — ABNORMAL HIGH (ref 1.4–7.0)
Neutrophils: 65 %
Platelets: 377 10*3/uL (ref 150–450)
RBC: 4.55 x10E6/uL (ref 3.77–5.28)
RDW: 13.6 % (ref 11.7–15.4)
WBC: 14.9 10*3/uL — ABNORMAL HIGH (ref 3.4–10.8)

## 2022-02-03 ENCOUNTER — Encounter: Payer: Self-pay | Admitting: General Practice

## 2022-02-04 ENCOUNTER — Encounter: Payer: Self-pay | Admitting: Nurse Practitioner

## 2022-02-06 ENCOUNTER — Other Ambulatory Visit: Payer: Self-pay

## 2022-02-09 ENCOUNTER — Other Ambulatory Visit: Payer: 59

## 2022-02-11 ENCOUNTER — Encounter: Payer: Self-pay | Admitting: Nurse Practitioner

## 2022-02-12 ENCOUNTER — Other Ambulatory Visit: Payer: 59

## 2022-02-17 ENCOUNTER — Ambulatory Visit: Payer: Medicaid Other | Admitting: Nurse Practitioner

## 2022-02-20 ENCOUNTER — Other Ambulatory Visit: Payer: Self-pay

## 2022-02-23 ENCOUNTER — Encounter: Payer: Self-pay | Admitting: Nurse Practitioner

## 2022-02-23 ENCOUNTER — Ambulatory Visit (HOSPITAL_BASED_OUTPATIENT_CLINIC_OR_DEPARTMENT_OTHER): Payer: 59 | Admitting: Nurse Practitioner

## 2022-02-23 DIAGNOSIS — F418 Other specified anxiety disorders: Secondary | ICD-10-CM | POA: Diagnosis not present

## 2022-02-23 DIAGNOSIS — F32A Depression, unspecified: Secondary | ICD-10-CM | POA: Diagnosis not present

## 2022-02-23 DIAGNOSIS — F419 Anxiety disorder, unspecified: Secondary | ICD-10-CM

## 2022-02-23 NOTE — Progress Notes (Signed)
Virtual Visit via Telephone Note  I discussed the limitations, risks, security and privacy concerns of performing an evaluation and management service by telephone and the availability of in person appointments. I also discussed with the patient that there may be a patient responsible charge related to this service. The patient expressed understanding and agreed to proceed.    I connected with Samantha [name] on 02/23/22  at   1:30 PM EDT  EDT by telephone and verified that I am speaking with the correct person using two identifiers.  Location of Patient: Private Residence   Location of Provider: [place] and [employer] Office    Persons participating in Telemedicine visit: Geryl Rankins FNP-BC Ceclia Kroeker    History of Present Illness: Telemedicine visit for: Anxiety and Depression  Anxiety: Patient complains of anxiety disorder and panic attacks.  She has the following symptoms: difficulty concentrating, feelings of losing control, irritable, racing thoughts. Onset of symptoms was approximately several months ago, gradually worsening since that time. She denies current suicidal and homicidal ideation. Possible organic causes contributing are: none. Risk factors: none.  Previous treatment includes BuSpar.  She complains of the following side effects from the treatment: none.   Depression: Patient complains of depression. She complains of depressed mood, difficulty concentrating, impaired memory, and psychomotor agitation. Onset was approximately several months ago, gradually worsening since that time.  She denies current suicidal and homicidal plan or intent.    Previous treatment includes BuSpar which she is currently prescribed.    Past Medical History:  Diagnosis Date   Diabetes mellitus without complication ([place])    Hyperlipidemia    Hypertension     History reviewed. No pertinent surgical history.  Family History  Problem Relation Age of Onset   Cancer Father      Social History   Socioeconomic History   Marital status: Single    Spouse name: Not on file   Number of children: Not on file   Years of education: Not on file   Highest education level: Not on file  Occupational History   Not on file  Tobacco Use   Smoking status: Every Day    Packs/day: 0.50    Years: 0.40    Total pack years: 0.20    Types: Cigarettes   Smokeless tobacco: Never  Vaping Use   Vaping Use: Never used  Substance and Sexual Activity   Alcohol use: No   Drug use: No   Sexual activity: Yes    Birth control/protection: None  Other Topics Concern   Not on file  Social History Narrative   Not on file   Social Determinants of Health   Financial Resource Strain: Not on file  Food Insecurity: Not on file  Transportation Needs: Not on file  Physical Activity: Not on file  Stress: Not on file  Social Connections: Not on file     Observations/Objective: Awake, alert and oriented x 3   Review of Systems  Constitutional:  Negative for fever, malaise/fatigue and weight loss.  HENT: Negative.  Negative for nosebleeds.   Eyes: Negative.  Negative for blurred vision, double vision and photophobia.  Respiratory: Negative.  Negative for cough and shortness of breath.   Cardiovascular: Negative.  Negative for chest pain, palpitations and leg swelling.  Gastrointestinal: Negative.  Negative for heartburn, nausea and vomiting.  Musculoskeletal: Negative.  Negative for myalgias.  Neurological: Negative.  Negative for dizziness, focal weakness, seizures and headaches.  Psychiatric/Behavioral:  Positive for depression. Negative for suicidal ideas.  The patient is nervous/anxious.     Assessment and Plan: Diagnoses and all orders for this visit:  Anxiety and depression FMLA forms to be completed. Recommended walk in service at [facility]    Follow Up Instructions Return in about 3 months (around 05/25/2022) for anxiety and depression.     I discussed the assessment  and treatment plan with the patient. The patient was provided an opportunity to ask questions and all were answered. The patient agreed with the plan and demonstrated an understanding of the instructions.   The patient was advised to call back or seek an in-person evaluation if the symptoms worsen or if the condition fails to improve as anticipated.  I provided 11 minutes of non-face-to-face time during this encounter including median intraservice time, reviewing previous notes, labs, imaging, medications and explaining diagnosis and management.  [name], FNP-BC

## 2022-02-27 ENCOUNTER — Other Ambulatory Visit: Payer: Self-pay

## 2022-02-27 ENCOUNTER — Telehealth: Payer: Self-pay | Admitting: Nurse Practitioner

## 2022-02-27 NOTE — Telephone Encounter (Signed)
Pt is calling to check on the FMLA paperwork the deadline with 02/20/22 HR has already called the pt to check on the status of the forms. Please advise CB- [phone]

## 2022-03-02 ENCOUNTER — Encounter: Payer: Self-pay | Admitting: Nurse Practitioner

## 2022-03-02 ENCOUNTER — Other Ambulatory Visit: Payer: Self-pay

## 2022-03-02 ENCOUNTER — Ambulatory Visit: Payer: 59 | Admitting: Nurse Practitioner

## 2022-03-02 NOTE — Telephone Encounter (Signed)
Patient aware that paperwork is waiting at the front desk for pickup.

## 2022-03-03 ENCOUNTER — Other Ambulatory Visit: Payer: Self-pay

## 2022-03-03 ENCOUNTER — Encounter: Payer: Self-pay | Admitting: Nurse Practitioner

## 2022-04-01 DIAGNOSIS — R0789 Other chest pain: Secondary | ICD-10-CM | POA: Diagnosis not present

## 2022-04-01 DIAGNOSIS — I10 Essential (primary) hypertension: Secondary | ICD-10-CM | POA: Diagnosis not present

## 2022-04-01 DIAGNOSIS — R079 Chest pain, unspecified: Secondary | ICD-10-CM | POA: Diagnosis not present

## 2022-04-03 ENCOUNTER — Encounter: Payer: 59 | Admitting: Obstetrics and Gynecology

## 2022-04-03 NOTE — Progress Notes (Deleted)
NEW GYNECOLOGY PATIENT Patient name: Samantha [name] MRN [id number]  Date of birth: [birth year] Chief Complaint:   No chief complaint on file.     History:  Samantha [name] is a 41 y.o. [phone] being seen today for ***.    HPI   ROS      Gynecologic History No LMP recorded. Patient has had an implant. Contraception: NuvaRing vaginal inserts Last Pap: ***. Result was {norm/abn:16337} with negative HPV Last Mammogram: ***.  Result was {norm/abn:16337} Last Colonoscopy: ***.  Result was {norm/abn:16337}  Obstetric History OB History  Gravida Para Term Preterm AB Living  _0 SAB IAB Ectopic Multiple Live Births  1            # Outcome Date GA Lbr Len/2nd Weight Sex Delivery Anes PTL Lv  9 Gravida              Birth Comments: System Generated. Please review and update pregnancy details.  8 SAB           7 Term           6 Term           5 Term           4 Term           3 Term           2 Term           1 Term             Past Medical History:  Diagnosis Date   Diabetes mellitus without complication ([place])    Hyperlipidemia    Hypertension     No past surgical history on file.  Current Outpatient Medications on File Prior to Visit  Medication Sig Dispense Refill   albuterol (VENTOLIN HFA) 108 (90 Base) MCG/ACT inhaler Inhale 2 puffs into the lungs every 6 (six) hours as needed for wheezing or shortness of breath. 8.5 g 2   amLODipine (NORVASC) 10 MG tablet Take 1 tablet (10 mg total) by mouth daily. 90 tablet 1   blood glucose meter kit and supplies Dispense based on patient and insurance preference. Use up to two times daily as directed. ICD-10 E11.65. 1 each 0   Blood Pressure Monitor DEVI Please provide patient with insurance approved blood pressure monitor 1 Device 0   busPIRone (BUSPAR) 5 MG tablet Take 2 tablets (10 mg total) by mouth 3 (three) times daily as needed. 60 tablet 1   Dulaglutide (TRULICITY) 8.67 JQ/4.9EE SOPN Inject 0.75 mg into the  skin once a week. 2 mL 1   etonogestrel-ethinyl estradiol (NUVARING) 0.12-0.015 MG/24HR vaginal ring Insert vaginally and leave in place for 3 consecutive weeks, then remove for 1 week. 1 each 12   lisinopril-hydrochlorothiazide (ZESTORETIC) 20-25 MG tablet Take 1 tablet by mouth daily. 90 tablet 3   Misc. Devices MISC Please provide patient with insurance approved blood pressure monitor 1 each 0   ondansetron (ZOFRAN-ODT) 4 MG disintegrating tablet Dissolve 1 tablet by mouth every 8 hour for up to 7 days as needed for nausea. 12 tablet 1   PARoxetine (PAXIL) 20 MG tablet Take 1 tablet (20 mg total) by mouth daily. 90 tablet 0   rosuvastatin (CRESTOR) 20 MG tablet Take 1 tablet (20 mg total) by mouth daily. 90 tablet 1   Saxagliptin-Metformin (KOMBIGLYZE XR) 09-998 MG TB24 Take 1 tablet by mouth daily. 90 tablet 1   No  current facility-administered medications on file prior to visit.    No Known Allergies  Social History:  reports that she has been smoking cigarettes. She has a 0.20 pack-year smoking history. She has never used smokeless tobacco. She reports that she does not drink alcohol and does not use drugs.  Family History  Problem Relation Age of Onset   Cancer Father     The following portions of the patient's history were reviewed and updated as appropriate: allergies, current medications, past family history, past medical history, past social history, past surgical history and problem list.  Review of Systems Pertinent items noted in HPI and remainder of comprehensive ROS otherwise negative.  Physical Exam:  There were no vitals taken for this visit. Physical Exam     Assessment and Plan:   1. Leiomyoma ***    Routine preventative health maintenance measures emphasized. Please refer to After Visit Summary for other counseling recommendations.      Darliss Cheney, MD Obstetrician & Gynecologist, Faculty Practice [facility]  for [employer], [place]

## 2022-04-06 ENCOUNTER — Encounter: Payer: Self-pay | Admitting: Nurse Practitioner

## 2022-04-06 ENCOUNTER — Other Ambulatory Visit: Payer: Self-pay

## 2022-04-06 ENCOUNTER — Telehealth: Payer: Self-pay | Admitting: Emergency Medicine

## 2022-04-06 NOTE — Telephone Encounter (Signed)
Copied from [place] [id number] [phone]. Topic: General - Other >> Apr 06, 2022 12:20 PM Ja-Kwan M wrote: Reason for CRM: Pt called for an update on the FMLA paperwork that was faxed in. Pt requests call back at [phone]

## 2022-04-07 ENCOUNTER — Encounter: Payer: Self-pay | Admitting: General Practice

## 2022-04-07 ENCOUNTER — Other Ambulatory Visit: Payer: Self-pay

## 2022-04-09 ENCOUNTER — Other Ambulatory Visit: Payer: Self-pay

## 2022-04-10 ENCOUNTER — Other Ambulatory Visit: Payer: Self-pay | Admitting: Critical Care Medicine

## 2022-04-10 ENCOUNTER — Other Ambulatory Visit: Payer: Self-pay

## 2022-04-10 DIAGNOSIS — E1165 Type 2 diabetes mellitus with hyperglycemia: Secondary | ICD-10-CM

## 2022-04-10 MED ORDER — TRULICITY 0.75 MG/0.5ML ~~LOC~~ SOAJ
0.7500 mg | SUBCUTANEOUS | 1 refills | Status: DC
  Filled 2022-04-10 – 2022-05-04 (×3): qty 2, 28d supply, fill #0
  Filled 2022-06-03 (×2): qty 2, 28d supply, fill #1

## 2022-04-10 NOTE — Telephone Encounter (Signed)
Requested Prescriptions  Pending Prescriptions Disp Refills   Dulaglutide (TRULICITY) 2.70 WC/3.7SE SOPN 6 mL 1    Sig: Inject 0.75 mg into the skin once a week.     Endocrinology:  Diabetes - GLP-1 Receptor Agonists Passed - 04/10/2022  5:24 PM      Passed - HBA1C is between 0 and 7.9 and within 180 days    Hemoglobin A1C  Date Value Ref Range Status  01/30/2022 7.2 (A) 4.0 - 5.6 % Final   HbA1c, POC (controlled diabetic range)  Date Value Ref Range Status  08/13/2021 11.9 (A) 0.0 - 7.0 % Final         Passed - Valid encounter within last 6 months    Recent Outpatient Visits           1 month ago Anxiety and depression   [place], [place] W, NP   2 months ago Type 2 diabetes mellitus with hyperglycemia, without long-term current use of insulin [facility])   [place], Vernia Buff, NP   2 months ago Vaginal itching   [place] Elsie Stain, MD   8 months ago Essential hypertension   [place], Vernia Buff, NP   1 year ago Essential hypertension   [place] Dorna Mai, MD

## 2022-04-13 ENCOUNTER — Other Ambulatory Visit: Payer: Self-pay

## 2022-04-20 ENCOUNTER — Other Ambulatory Visit: Payer: Self-pay

## 2022-04-20 ENCOUNTER — Other Ambulatory Visit: Payer: Self-pay | Admitting: Nurse Practitioner

## 2022-04-20 ENCOUNTER — Telehealth: Payer: Self-pay | Admitting: Nurse Practitioner

## 2022-04-20 DIAGNOSIS — E1165 Type 2 diabetes mellitus with hyperglycemia: Secondary | ICD-10-CM

## 2022-04-20 MED ORDER — METFORMIN HCL ER 500 MG PO TB24
500.0000 mg | ORAL_TABLET | Freq: Two times a day (BID) | ORAL | 1 refills | Status: DC
  Filled 2022-04-20 – 2022-06-03 (×2): qty 60, 30d supply, fill #0
  Filled 2022-06-03: qty 180, 90d supply, fill #0
  Filled 2022-07-20: qty 60, 30d supply, fill #1

## 2022-04-20 NOTE — Telephone Encounter (Signed)
Medication Refill - Medication:  Saxagliptin-Metformin (KOMBIGLYZE XR) 09-998 MG TB24 Dulaglutide (TRULICITY) 1.55 MC/8.0EM SOPN   Has the patient contacted their pharmacy? Yes.   Pt states her medication accidentally was thrown away.  Pt can get her medication for refill on 11/29, but has no medication for the next 9 days. Pt would like to know if the dr has samples to cover her. Preferred Pharmacy (with phone number or street name):  [place]   Has the patient been seen for an appointment in the last year OR does the patient have an upcoming appointment? Yes.

## 2022-04-20 NOTE — Telephone Encounter (Signed)
Message answered in secure chat of [facility] by Lurena Joiner-   It looks like Trulicity is covered for a $25 copay. The Kombiglyze should be discontinued. You aren't supposed to take the saxagliptin in [place] with Trulicity. I recommend to change Kombiglyze to metformin. It looks like she can only tolerate metformin XR. I would see if Zelda can change the Kombiglyze to metformin 500 mg XR BID. The Trulicity we don't have to change - it's $25 for a month according to [facility]   Patient notified that Metformin 500 mg XR was sent to pharmacy and to continue this regimen if she can tolerated it.  Verbalized understanding.

## 2022-04-24 ENCOUNTER — Other Ambulatory Visit: Payer: Self-pay

## 2022-05-04 ENCOUNTER — Other Ambulatory Visit: Payer: Self-pay

## 2022-06-03 ENCOUNTER — Other Ambulatory Visit: Payer: Self-pay

## 2022-06-04 ENCOUNTER — Other Ambulatory Visit: Payer: Self-pay

## 2022-06-05 ENCOUNTER — Other Ambulatory Visit: Payer: Self-pay

## 2022-06-29 ENCOUNTER — Emergency Department (HOSPITAL_BASED_OUTPATIENT_CLINIC_OR_DEPARTMENT_OTHER): Payer: 59

## 2022-06-29 ENCOUNTER — Inpatient Hospital Stay (HOSPITAL_BASED_OUTPATIENT_CLINIC_OR_DEPARTMENT_OTHER)
Admission: EM | Admit: 2022-06-29 | Discharge: 2022-07-03 | DRG: 690 | Disposition: A | Discharge status: Home / Self Care | Payer: 59 | Attending: Internal Medicine | Admitting: Internal Medicine

## 2022-06-29 ENCOUNTER — Ambulatory Visit: Payer: 59 | Admitting: Internal Medicine

## 2022-06-29 ENCOUNTER — Encounter (HOSPITAL_BASED_OUTPATIENT_CLINIC_OR_DEPARTMENT_OTHER): Payer: Self-pay

## 2022-06-29 ENCOUNTER — Other Ambulatory Visit: Payer: Self-pay

## 2022-06-29 DIAGNOSIS — R0602 Shortness of breath: Secondary | ICD-10-CM | POA: Diagnosis not present

## 2022-06-29 DIAGNOSIS — R1310 Dysphagia, unspecified: Secondary | ICD-10-CM

## 2022-06-29 DIAGNOSIS — K760 Fatty (change of) liver, not elsewhere classified: Secondary | ICD-10-CM | POA: Diagnosis present

## 2022-06-29 DIAGNOSIS — R000 Tachycardia, unspecified: Secondary | ICD-10-CM | POA: Diagnosis not present

## 2022-06-29 DIAGNOSIS — F17210 Nicotine dependence, cigarettes, uncomplicated: Secondary | ICD-10-CM | POA: Diagnosis present

## 2022-06-29 DIAGNOSIS — Z7984 Long term (current) use of oral hypoglycemic drugs: Secondary | ICD-10-CM

## 2022-06-29 DIAGNOSIS — N10 Acute tubulo-interstitial nephritis: Principal | ICD-10-CM | POA: Diagnosis present

## 2022-06-29 DIAGNOSIS — Z6831 Body mass index (BMI) 31.0-31.9, adult: Secondary | ICD-10-CM

## 2022-06-29 DIAGNOSIS — E441 Mild protein-calorie malnutrition: Secondary | ICD-10-CM | POA: Diagnosis present

## 2022-06-29 DIAGNOSIS — B961 Klebsiella pneumoniae [K. pneumoniae] as the cause of diseases classified elsewhere: Secondary | ICD-10-CM | POA: Diagnosis present

## 2022-06-29 DIAGNOSIS — E871 Hypo-osmolality and hyponatremia: Secondary | ICD-10-CM | POA: Diagnosis present

## 2022-06-29 DIAGNOSIS — E1169 Type 2 diabetes mellitus with other specified complication: Secondary | ICD-10-CM | POA: Diagnosis present

## 2022-06-29 DIAGNOSIS — N12 Tubulo-interstitial nephritis, not specified as acute or chronic: Secondary | ICD-10-CM

## 2022-06-29 DIAGNOSIS — Z79899 Other long term (current) drug therapy: Secondary | ICD-10-CM

## 2022-06-29 DIAGNOSIS — Z1152 Encounter for screening for COVID-19: Secondary | ICD-10-CM

## 2022-06-29 DIAGNOSIS — R300 Dysuria: Secondary | ICD-10-CM | POA: Diagnosis not present

## 2022-06-29 DIAGNOSIS — Z7985 Long-term (current) use of injectable non-insulin antidiabetic drugs: Secondary | ICD-10-CM

## 2022-06-29 DIAGNOSIS — R109 Unspecified abdominal pain: Secondary | ICD-10-CM | POA: Diagnosis not present

## 2022-06-29 DIAGNOSIS — E876 Hypokalemia: Secondary | ICD-10-CM | POA: Diagnosis present

## 2022-06-29 DIAGNOSIS — E785 Hyperlipidemia, unspecified: Secondary | ICD-10-CM | POA: Diagnosis present

## 2022-06-29 DIAGNOSIS — E1165 Type 2 diabetes mellitus with hyperglycemia: Secondary | ICD-10-CM | POA: Diagnosis present

## 2022-06-29 DIAGNOSIS — D649 Anemia, unspecified: Secondary | ICD-10-CM | POA: Diagnosis present

## 2022-06-29 DIAGNOSIS — E66811 Obesity, class 1: Secondary | ICD-10-CM | POA: Diagnosis present

## 2022-06-29 DIAGNOSIS — R059 Cough, unspecified: Secondary | ICD-10-CM | POA: Diagnosis not present

## 2022-06-29 DIAGNOSIS — E669 Obesity, unspecified: Secondary | ICD-10-CM | POA: Diagnosis present

## 2022-06-29 DIAGNOSIS — T502X5A Adverse effect of carbonic-anhydrase inhibitors, benzothiadiazides and other diuretics, initial encounter: Secondary | ICD-10-CM | POA: Diagnosis present

## 2022-06-29 DIAGNOSIS — I10 Essential (primary) hypertension: Secondary | ICD-10-CM | POA: Diagnosis present

## 2022-06-29 LAB — CBC
HCT: 38.9 % (ref 36.0–46.0)
Hemoglobin: 13.7 g/dL (ref 12.0–15.0)
MCH: 28.7 pg (ref 26.0–34.0)
MCHC: 35.2 g/dL (ref 30.0–36.0)
MCV: 81.4 fL (ref 80.0–100.0)
Platelets: 346 10*3/uL (ref 150–400)
RBC: 4.78 MIL/uL (ref 3.87–5.11)
RDW: 13.2 % (ref 11.5–15.5)
WBC: 17.7 10*3/uL — ABNORMAL HIGH (ref 4.0–10.5)
nRBC: 0 % (ref 0.0–0.2)

## 2022-06-29 LAB — URINALYSIS, ROUTINE W REFLEX MICROSCOPIC
Bilirubin Urine: NEGATIVE
Glucose, UA: >=500 mg/dL — AB
Ketones, ur: 15 mg/dL — AB
Leukocytes,Ua: NEGATIVE
Nitrite: NEGATIVE
Protein, ur: NEGATIVE mg/dL
Specific Gravity, Urine: <=1.005 (ref 1.005–1.030)
pH: 5 (ref 5.0–8.0)

## 2022-06-29 LAB — COMPREHENSIVE METABOLIC PANEL
ALT: 22 U/L (ref 0–44)
AST: 31 U/L (ref 15–41)
Albumin: 3 g/dL — ABNORMAL LOW (ref 3.5–5.0)
Alkaline Phosphatase: 80 U/L (ref 38–126)
Anion gap: 14 (ref 5–15)
BUN: 12 mg/dL (ref 6–20)
CO2: 23 mmol/L (ref 22–32)
Calcium: 9 mg/dL (ref 8.9–10.3)
Chloride: 88 mmol/L — ABNORMAL LOW (ref 98–111)
Creatinine, Ser: 0.82 mg/dL (ref 0.44–1.00)
GFR, Estimated: >60 mL/min (ref >60)
Glucose, Bld: 488 mg/dL — ABNORMAL HIGH (ref 70–99)
Potassium: 3.3 mmol/L — ABNORMAL LOW (ref 3.5–5.1)
Sodium: 125 mmol/L — ABNORMAL LOW (ref 135–145)
Total Bilirubin: 0.8 mg/dL (ref 0.3–1.2)
Total Protein: 8 g/dL (ref 6.5–8.1)

## 2022-06-29 LAB — I-STAT VENOUS BLOOD GAS, ED
Acid-Base Excess: 4 mmol/L — ABNORMAL HIGH (ref 0.0–2.0)
Bicarbonate: 29.2 mmol/L — ABNORMAL HIGH (ref 20.0–28.0)
Calcium, Ion: 1.14 mmol/L — ABNORMAL LOW (ref 1.15–1.40)
HCT: 40 % (ref 36.0–46.0)
Hemoglobin: 13.6 g/dL (ref 12.0–15.0)
O2 Saturation: 63 %
Patient temperature: 98.3
Potassium: 3.1 mmol/L — ABNORMAL LOW (ref 3.5–5.1)
Sodium: 129 mmol/L — ABNORMAL LOW (ref 135–145)
TCO2: 31 mmol/L (ref 22–32)
pCO2, Ven: 45.7 mmHg (ref 44–60)
pH, Ven: 7.412 (ref 7.25–7.43)
pO2, Ven: 32 mmHg (ref 32–45)

## 2022-06-29 LAB — LACTIC ACID, PLASMA: Lactic Acid, Venous: 2.1 mmol/L (ref 0.5–1.9)

## 2022-06-29 LAB — URINALYSIS, MICROSCOPIC (REFLEX)

## 2022-06-29 LAB — RESP PANEL BY RT-PCR (RSV, FLU A&B, COVID)  RVPGX2
Influenza A by PCR: NEGATIVE
Influenza B by PCR: NEGATIVE
Resp Syncytial Virus by PCR: NEGATIVE
SARS Coronavirus 2 by RT PCR: NEGATIVE

## 2022-06-29 LAB — LIPASE, BLOOD: Lipase: 31 U/L (ref 11–51)

## 2022-06-29 LAB — PREGNANCY, URINE: Preg Test, Ur: NEGATIVE

## 2022-06-29 MED ORDER — IOHEXOL 300 MG/ML  SOLN
100.0000 mL | Freq: Once | INTRAMUSCULAR | Status: AC | PRN
  Administered 2022-06-29: 100 mL via INTRAVENOUS

## 2022-06-29 MED ORDER — SODIUM CHLORIDE 0.9 % IV SOLN
1.0000 g | Freq: Once | INTRAVENOUS | Status: AC
  Administered 2022-06-30: 1 g via INTRAVENOUS
  Filled 2022-06-29: qty 10

## 2022-06-29 MED ORDER — SODIUM CHLORIDE 0.9 % IV BOLUS
1000.0000 mL | Freq: Once | INTRAVENOUS | Status: AC
  Administered 2022-06-29: 1000 mL via INTRAVENOUS

## 2022-06-29 MED ORDER — ONDANSETRON HCL 4 MG/2ML IJ SOLN
4.0000 mg | Freq: Once | INTRAMUSCULAR | Status: AC
  Administered 2022-06-29: 4 mg via INTRAVENOUS
  Filled 2022-06-29: qty 2

## 2022-06-29 MED ORDER — MORPHINE SULFATE (PF) 4 MG/ML IV SOLN
4.0000 mg | Freq: Once | INTRAVENOUS | Status: AC
  Administered 2022-06-29: 4 mg via INTRAVENOUS
  Filled 2022-06-29: qty 1

## 2022-06-29 NOTE — ED Notes (Signed)
Discussed DM and blood sugar, educ done on DM mgmt, pt receptive, v/u

## 2022-06-29 NOTE — ED Notes (Signed)
Lab called with critical lactic acid of 2.1 Dr. Sherry Ruffing informed directly

## 2022-06-29 NOTE — ED Triage Notes (Signed)
Pt states she "started feeling sick" Thurs night, assumed it was covid. States yesterday she began having pain in LRQ abd. Endorses some SHOB, poor PO, dehydration, burning w urination. Pt states she's been trying to self-diagnose but has too many to choose from.

## 2022-06-29 NOTE — ED Provider Notes (Signed)
[place] [facility] [place] Provider Note   CSN: [id number] Arrival date & time: 06/29/22  2016     History  Chief Complaint  Patient presents with   Abdominal Pain    Samantha [name] is a 42 y.o. female.  The history is provided by the patient and medical records. No language interpreter was used.  Abdominal Pain Pain location:  RLQ Pain quality: aching and cramping   Pain radiates to:  Does not radiate Pain severity:  Severe Onset quality:  Gradual Duration:  5 days Timing:  Constant Progression:  Worsening Chronicity:  New Context: not sick contacts and not trauma   Relieved by:  Nothing Worsened by:  Nothing Associated symptoms: chills, constipation, cough, dysuria, fatigue, nausea and shortness of breath   Associated symptoms: no chest pain, no diarrhea, no fever, no hematuria, no vaginal bleeding, no vaginal discharge and no vomiting        Home Medications Prior to Admission medications   Medication Sig Start Date End Date Taking? Authorizing Provider  albuterol (VENTOLIN HFA) 108 (90 Base) MCG/ACT inhaler Inhale 2 puffs into the lungs every 6 (six) hours as needed for wheezing or shortness of breath. 08/13/21   [name], NP  amLODipine (NORVASC) 10 MG tablet Take 1 tablet (10 mg total) by mouth daily. 01/27/22 07/09/22  Elsie Stain, MD  blood glucose meter kit and supplies Dispense based on patient and insurance preference. Use up to two times daily as directed. ICD-10 E11.65. 11/20/19   [name], NP  Blood Pressure Monitor DEVI Please provide patient with insurance approved blood pressure monitor 12/24/18   [name], NP  busPIRone (BUSPAR) 5 MG tablet Take 2 tablets (10 mg total) by mouth 3 (three) times daily as needed. 08/13/21   [name], NP  Dulaglutide (TRULICITY) 2.95 AO/1.3YQ SOPN Inject 0.75 mg into the skin once a week. 04/10/22   [name], NP  etonogestrel-ethinyl estradiol (NUVARING)  0.12-0.015 MG/24HR vaginal ring Insert vaginally and leave in place for 3 consecutive weeks, then remove for 1 week. 11/21/21   [name], NP  lisinopril-hydrochlorothiazide (ZESTORETIC) 20-25 MG tablet Take 1 tablet by mouth daily. 08/13/21   [name], NP  metFORMIN (GLUCOPHAGE-XR) 500 MG 24 hr tablet Take 1 tablet (500 mg total) by mouth 2 (two) times daily with a meal. 04/20/22   [name], NP  Misc. Devices MISC Please provide patient with insurance approved blood pressure monitor 12/24/18   [name], NP  ondansetron (ZOFRAN-ODT) 4 MG disintegrating tablet Dissolve 1 tablet by mouth every 8 hour for up to 7 days as needed for nausea. 01/23/22     PARoxetine (PAXIL) 20 MG tablet Take 1 tablet (20 mg total) by mouth daily. 01/27/22   Elsie Stain, MD  rosuvastatin (CRESTOR) 20 MG tablet Take 1 tablet (20 mg total) by mouth daily. 01/27/22 01/27/23  Elsie Stain, MD  Saxagliptin-Metformin (KOMBIGLYZE XR) 09-998 MG TB24 Take 1 tablet by mouth daily. 01/27/22   Elsie Stain, MD      Allergies    Patient has no known allergies.    Review of Systems   Review of Systems  Constitutional:  Positive for chills and fatigue. Negative for fever.  HENT:  Negative for congestion.   Respiratory:  Positive for cough, chest tightness and shortness of breath. Negative for wheezing.   Cardiovascular:  Negative for chest pain, palpitations and leg swelling.  Gastrointestinal:  Positive for abdominal pain, constipation and nausea. Negative for abdominal distention, diarrhea and vomiting.  Genitourinary:  Positive for dysuria. Negative for flank pain, hematuria, vaginal bleeding and vaginal discharge.  Musculoskeletal:  Positive for back pain. Negative for neck pain and neck stiffness.  Skin:  Negative for rash and wound.  Neurological:  Negative for dizziness, weakness, light-headedness, numbness and headaches.  Psychiatric/Behavioral:  Negative for agitation and confusion.    All other systems reviewed and are negative.   Physical Exam Updated Vital Signs BP 121/87   Pulse (!) 114   Temp 98.3 F (36.8 C) (Oral)   Resp 16   Ht '5\' 2"'$  (1.575 m)   Wt 77.6 kg   SpO2 97%   BMI 31.28 kg/m  Physical Exam Vitals and nursing note reviewed.  Constitutional:      General: She is not in acute distress.    Appearance: She is well-developed. She is not ill-appearing, toxic-appearing or diaphoretic.  HENT:     Head: Normocephalic and atraumatic.     Nose: Nose normal. No congestion or rhinorrhea.     Mouth/Throat:     Mouth: Mucous membranes are dry.     Pharynx: No oropharyngeal exudate or posterior oropharyngeal erythema.  Eyes:     Extraocular Movements: Extraocular movements intact.     Conjunctiva/sclera: Conjunctivae normal.     Pupils: Pupils are equal, round, and reactive to light.  Cardiovascular:     Rate and Rhythm: Normal rate and regular rhythm.     Heart sounds: No murmur heard. Pulmonary:     Effort: Pulmonary effort is normal. No respiratory distress.     Breath sounds: Normal breath sounds. No wheezing, rhonchi or rales.  Chest:     Chest wall: No tenderness.  Abdominal:     General: Bowel sounds are normal. There is no distension.     Palpations: Abdomen is soft.     Tenderness: There is abdominal tenderness in the right lower quadrant and suprapubic area. There is no right CVA tenderness, left CVA tenderness, guarding or rebound.  Musculoskeletal:        General: No swelling.     Cervical back: Neck supple. No tenderness.     Right lower leg: No edema.     Left lower leg: No edema.  Skin:    General: Skin is warm and dry.     Capillary Refill: Capillary refill takes less than 2 seconds.     Findings: No erythema or rash.  Neurological:     General: No focal deficit present.     Mental Status: She is alert.     Sensory: No sensory deficit.     Motor: No weakness.  Psychiatric:        Mood and Affect: Mood normal.     ED  Results / Procedures / Treatments   Labs (all labs ordered are listed, but only abnormal results are displayed) Labs Reviewed  COMPREHENSIVE METABOLIC PANEL - Abnormal; Notable for the following components:      Result Value   Sodium 125 (*)    Potassium 3.3 (*)    Chloride 88 (*)    Glucose, Bld 488 (*)    Albumin 3.0 (*)    All other components within normal limits  CBC - Abnormal; Notable for the following components:   WBC 17.7 (*)    All other components within normal limits  URINALYSIS, ROUTINE W REFLEX MICROSCOPIC - Abnormal; Notable for the following components:  Glucose, UA >=500 (*)    Hgb urine dipstick TRACE (*)    Ketones, ur 15 (*)    All other components within normal limits  URINALYSIS, MICROSCOPIC (REFLEX) - Abnormal; Notable for the following components:   Bacteria, UA MANY (*)    All other components within normal limits  LACTIC ACID, PLASMA - Abnormal; Notable for the following components:   Lactic Acid, Venous 2.1 (*)    All other components within normal limits  I-STAT VENOUS BLOOD GAS, ED - Abnormal; Notable for the following components:   Bicarbonate 29.2 (*)    Acid-Base Excess 4.0 (*)    Sodium 129 (*)    Potassium 3.1 (*)    Calcium, Ion 1.14 (*)    All other components within normal limits  RESP PANEL BY RT-PCR (RSV, FLU A&B, COVID)  [id number]  URINE CULTURE  LIPASE, BLOOD  PREGNANCY, URINE  LACTIC ACID, PLASMA  BETA-HYDROXYBUTYRIC ACID    EKG EKG Interpretation  Date/Time:  Monday June 29 2022 22:53:31 EST Ventricular Rate:  105 PR Interval:  126 QRS Duration: 88 QT Interval:  348 QTC Calculation: 460 R Axis:   38 Text Interpretation: Age not entered, assumed to be  42 years old for purpose of ECG interpretation Sinus tachycardia Probable left atrial enlargement when compared to prior, faster rate. No STEMI Confirmed by Antony Blackbird [phone]) on 06/29/2022 11:13:42 PM  Radiology CT ABDOMEN PELVIS W CONTRAST  Result Date:  06/29/2022 CLINICAL DATA:  Right lower quadrant abdominal pain. Burning with urination. EXAM: CT ABDOMEN AND PELVIS WITH CONTRAST TECHNIQUE: Multidetector CT imaging of the abdomen and pelvis was performed using the standard protocol following bolus administration of intravenous contrast. RADIATION DOSE REDUCTION: This exam was performed according to the departmental dose-optimization program which includes automated exposure control, adjustment of the mA and/or kV according to patient size and/or use of iterative reconstruction technique. CONTRAST:  157m OMNIPAQUE IOHEXOL 300 MG/ML  SOLN COMPARISON:  None Available. FINDINGS: Lower chest: No acute abnormality. Hepatobiliary: There is diffuse fatty infiltration of the liver. Gallbladder and bile ducts are within normal limits. Pancreas: Unremarkable. No pancreatic ductal dilatation or surrounding inflammatory changes. Spleen: Normal in size without focal abnormality. Adrenals/Urinary Tract: There is an ill-defined patchy mixed hypodense and hyperdense area in the anterior right kidney measuring proximally 2.7 by 2.3 by 3.0 cm. There is surrounding inflammatory stranding. Microcystic areas may be present internally best seen on coronal image 5/44. There is no hydronephrosis. There is mild enhancement of the proximal right ureter. The adrenal glands, left kidney and bladder are within normal limits. Stomach/Bowel: Stomach is within normal limits. Appendix appears normal. No evidence of bowel wall thickening, distention, or inflammatory changes. Vascular/Lymphatic: No significant vascular findings are present. No enlarged abdominal or pelvic lymph nodes. Reproductive: Uterus and bilateral adnexa are unremarkable. Other: No abdominal wall hernia or abnormality. No abdominopelvic ascites. Musculoskeletal: There are degenerative changes at L5-S1. IMPRESSION: 1. Findings compatible with focal right-sided pyelonephritis. Tiny microcystic areas in the region of infection  may represent small abscesses. Recommend follow-up to confirm complete resolution and to exclude underlying lesion. 2. Fatty infiltration of the liver. These results were called by telephone at the time of interpretation on 06/29/2022 at 11:39 pm to [facility], who verbally acknowledged these results. Electronically Signed   By: ARonney AstersM.D.   On: 06/29/2022 23:39   DG Chest Portable 1 View  Result Date: 06/29/2022 CLINICAL DATA:  Cough, shortness of breath EXAM: PORTABLE CHEST 1  VIEW COMPARISON:  04/01/2022 FINDINGS: Lungs are clear.  No pleural effusion or pneumothorax. The heart is normal in size. IMPRESSION: No evidence of acute cardiopulmonary disease. Electronically Signed   By: Julian Hy M.D.   On: 06/29/2022 22:49    Procedures Procedures    CRITICAL CARE Performed by: Gwenyth Allegra Josepha Barbier Total critical care time: 35 minutes Critical care time was exclusive of separately billable procedures and treating other patients. Critical care was necessary to treat or prevent imminent or life-threatening deterioration. Critical care was time spent personally by me on the following activities: development of treatment plan with patient and/or surrogate as well as nursing, discussions with consultants, evaluation of patient's response to treatment, examination of patient, obtaining history from patient or surrogate, ordering and performing treatments and interventions, ordering and review of laboratory studies, ordering and review of radiographic studies, pulse oximetry and re-evaluation of patient's condition.   Medications Ordered in ED Medications  cefTRIAXone (ROCEPHIN) 1 g in sodium chloride 0.9 % 100 mL IVPB (has no administration in time range)  sodium chloride 0.9 % bolus 1,000 mL (1,000 mLs Intravenous New Bag/Given 06/29/22 2302)  morphine (PF) 4 MG/ML injection 4 mg (4 mg Intravenous Given 06/29/22 2303)  ondansetron (ZOFRAN) injection 4 mg (4 mg  Intravenous Given 06/29/22 2302)  iohexol (OMNIPAQUE) 300 MG/ML solution 100 mL (100 mLs Intravenous Contrast Given 06/29/22 2322)    ED Course/ Medical Decision Making/ A&P                             Medical Decision Making Amount and/or Complexity of Data Reviewed Labs: ordered. Radiology: ordered.  Risk Prescription drug management. Decision regarding hospitalization.    Zoee Heeney is a 42 y.o. female with a past medical history significant for hypertension, hyperlipidemia, diabetes, and previous colitis presents with 5 days of nausea, worsening right lower quadrant abdominal pain, decreased oral intake, malaise, fatigue, chills, productive cough, shortness of breath, myalgias, and dysuria.  According to patient, since Thursday, she began having viral URI symptoms with some cough shortness of breath and chills.  She thought it might be COVID as she has had that in the past.  She reports she developed nausea but no vomiting.  Has had abdominal pain that was diffuse but then migrated to the right lower quadrant.  She reports it is 10 out of 10 at this time.  She denies any pelvic pain and denies any vaginal symptoms.  Reports some dysuria but otherwise denies hematuria.  Denies any trauma.  Denies any diarrhea and has some chronic constipation that is unchanged.  She had normal bowel movement this morning and is passing gas.  Denies any neurologic complaints.  She has had some shortness of breath but is denying any actual chest pain or palpitations.  Denies any sick contacts.  She reports that she has been switched to metformin for glucose management and has not been taking Trulicity in over a month.  On exam, lungs clear.  No rhonchi.  Chest nontender.  Abdomen is quite tender in the right lower quadrant.  Bowel sounds were appreciated.  She had no focal neurologic deficits with intact sensation, strength, and pulse in extremities.  Mucous membranes are dry.  No flank tenderness.  No  focal midline back tenderness.  Otherwise she diffusely has muscle soreness.  Clinically I do suspect she could have a viral infection however given the exquisite right lower quadrant tenderness, chills, and tachycardia, I  do feel we need to get imaging to rule out acute appendicitis versus diverticulitis versus other cause of symptoms.  She had some screening workup in triage that revealed she does have a leukocytosis of 17.7, elevated glucose of 488, and her anion gap is 14, her corrected sodium goes from 1 25-1 31 and her lipase is normal.  Her urinalysis did not show convincing evidence of infection with no nitrites or leukocytes and she was negative for COVID/flu/RSV.  Given the patient's symptoms we will get other labs to rule out early DKA with a beta hydroxy uric acid and venous blood gas.  Will get lactic acid given the concern for possible appendicitis and will give her some pain medicine, nausea medicine, and fluids.  Anticipate reassessment of her glucose after the fluids to make sure it is downtrending.  Will get chest x-ray with a cough.  Anticipate reassessment after workup to determine disposition.  11:17 PM Chest x-ray shows no pneumonia.  pH does not show acidosis.  Decreased concern for DKA now.  Will wait for results of CT abdomen pelvis to rule out appendicitis and reassess.  If imaging and workup was negative, suspect viral syndrome causing her constellation of symptoms and can have outpatient follow-up.  She will also need to call her PCP to discuss glucose management as in the setting of this acute abnormality her glucose has been elevated.  11:42 PM I personally viewed the CT images and I am concerned about infection on the right kidney.  I called radiology who agrees that appears to show an acute right-sided pyelonephritis with possible developing abscess.  Will give antibiotics and patient be admitted for further management.  Will admit to [facility] in case urology  needs to be involved at some point if symptoms do not improve.           Final Clinical Impression(s) / ED Diagnoses Final diagnoses:  Pyelonephritis  Dysuria    Rx / DC Orders ED Discharge Orders     None      Clinical Impression: 1. Pyelonephritis   2. Dysuria     Disposition: Admit  This note was prepared with assistance of Dragon voice recognition software. Occasional wrong-word or sound-a-like substitutions may have occurred due to the inherent limitations of voice recognition software.     Avanthika Dehnert, Gwenyth Allegra, MD 06/29/22 [phone]

## 2022-06-30 ENCOUNTER — Encounter (HOSPITAL_COMMUNITY): Payer: Self-pay | Admitting: Internal Medicine

## 2022-06-30 DIAGNOSIS — B961 Klebsiella pneumoniae [K. pneumoniae] as the cause of diseases classified elsewhere: Secondary | ICD-10-CM | POA: Diagnosis not present

## 2022-06-30 DIAGNOSIS — R300 Dysuria: Secondary | ICD-10-CM | POA: Diagnosis not present

## 2022-06-30 DIAGNOSIS — E871 Hypo-osmolality and hyponatremia: Secondary | ICD-10-CM | POA: Diagnosis not present

## 2022-06-30 DIAGNOSIS — Z79899 Other long term (current) drug therapy: Secondary | ICD-10-CM | POA: Diagnosis not present

## 2022-06-30 DIAGNOSIS — E876 Hypokalemia: Secondary | ICD-10-CM | POA: Diagnosis not present

## 2022-06-30 DIAGNOSIS — K760 Fatty (change of) liver, not elsewhere classified: Secondary | ICD-10-CM | POA: Diagnosis present

## 2022-06-30 DIAGNOSIS — N10 Acute tubulo-interstitial nephritis: Secondary | ICD-10-CM | POA: Diagnosis not present

## 2022-06-30 DIAGNOSIS — D649 Anemia, unspecified: Secondary | ICD-10-CM | POA: Diagnosis not present

## 2022-06-30 DIAGNOSIS — I10 Essential (primary) hypertension: Secondary | ICD-10-CM | POA: Diagnosis not present

## 2022-06-30 DIAGNOSIS — Z7984 Long term (current) use of oral hypoglycemic drugs: Secondary | ICD-10-CM | POA: Diagnosis not present

## 2022-06-30 DIAGNOSIS — E1169 Type 2 diabetes mellitus with other specified complication: Secondary | ICD-10-CM | POA: Diagnosis not present

## 2022-06-30 DIAGNOSIS — E669 Obesity, unspecified: Secondary | ICD-10-CM | POA: Diagnosis not present

## 2022-06-30 DIAGNOSIS — T502X5A Adverse effect of carbonic-anhydrase inhibitors, benzothiadiazides and other diuretics, initial encounter: Secondary | ICD-10-CM | POA: Diagnosis not present

## 2022-06-30 DIAGNOSIS — R1310 Dysphagia, unspecified: Secondary | ICD-10-CM | POA: Diagnosis not present

## 2022-06-30 DIAGNOSIS — F17210 Nicotine dependence, cigarettes, uncomplicated: Secondary | ICD-10-CM | POA: Diagnosis not present

## 2022-06-30 DIAGNOSIS — E1165 Type 2 diabetes mellitus with hyperglycemia: Secondary | ICD-10-CM | POA: Diagnosis not present

## 2022-06-30 DIAGNOSIS — Z1152 Encounter for screening for COVID-19: Secondary | ICD-10-CM | POA: Diagnosis not present

## 2022-06-30 DIAGNOSIS — Z7985 Long-term (current) use of injectable non-insulin antidiabetic drugs: Secondary | ICD-10-CM | POA: Diagnosis not present

## 2022-06-30 DIAGNOSIS — E441 Mild protein-calorie malnutrition: Secondary | ICD-10-CM | POA: Diagnosis not present

## 2022-06-30 DIAGNOSIS — E785 Hyperlipidemia, unspecified: Secondary | ICD-10-CM | POA: Diagnosis not present

## 2022-06-30 DIAGNOSIS — Z6831 Body mass index (BMI) 31.0-31.9, adult: Secondary | ICD-10-CM | POA: Diagnosis not present

## 2022-06-30 LAB — CBG MONITORING, ED: Glucose-Capillary: 335 mg/dL — ABNORMAL HIGH (ref 70–99)

## 2022-06-30 LAB — BASIC METABOLIC PANEL
Anion gap: 13 (ref 5–15)
BUN: 8 mg/dL (ref 6–20)
CO2: 21 mmol/L — ABNORMAL LOW (ref 22–32)
Calcium: 7.9 mg/dL — ABNORMAL LOW (ref 8.9–10.3)
Chloride: 97 mmol/L — ABNORMAL LOW (ref 98–111)
Creatinine, Ser: 0.61 mg/dL (ref 0.44–1.00)
GFR, Estimated: >60 mL/min (ref >60)
Glucose, Bld: 335 mg/dL — ABNORMAL HIGH (ref 70–99)
Potassium: 3.1 mmol/L — ABNORMAL LOW (ref 3.5–5.1)
Sodium: 131 mmol/L — ABNORMAL LOW (ref 135–145)

## 2022-06-30 LAB — CBC WITH DIFFERENTIAL/PLATELET
Abs Immature Granulocytes: 0.11 10*3/uL — ABNORMAL HIGH (ref 0.00–0.07)
Basophils Absolute: 0 10*3/uL (ref 0.0–0.1)
Basophils Relative: 0 %
Eosinophils Absolute: 0.1 10*3/uL (ref 0.0–0.5)
Eosinophils Relative: 1 %
HCT: 34.5 % — ABNORMAL LOW (ref 36.0–46.0)
Hemoglobin: 11.6 g/dL — ABNORMAL LOW (ref 12.0–15.0)
Immature Granulocytes: 1 %
Lymphocytes Relative: 12 %
Lymphs Abs: 2 10*3/uL (ref 0.7–4.0)
MCH: 28.8 pg (ref 26.0–34.0)
MCHC: 33.6 g/dL (ref 30.0–36.0)
MCV: 85.6 fL (ref 80.0–100.0)
Monocytes Absolute: 0.9 10*3/uL (ref 0.1–1.0)
Monocytes Relative: 6 %
Neutro Abs: 13.3 10*3/uL — ABNORMAL HIGH (ref 1.7–7.7)
Neutrophils Relative %: 80 %
Platelets: 275 10*3/uL (ref 150–400)
RBC: 4.03 MIL/uL (ref 3.87–5.11)
RDW: 13.5 % (ref 11.5–15.5)
WBC: 16.5 10*3/uL — ABNORMAL HIGH (ref 4.0–10.5)
nRBC: 0 % (ref 0.0–0.2)

## 2022-06-30 LAB — BETA-HYDROXYBUTYRIC ACID: Beta-Hydroxybutyric Acid: 1.99 mmol/L — ABNORMAL HIGH (ref 0.05–0.27)

## 2022-06-30 LAB — GLUCOSE, CAPILLARY
Glucose-Capillary: 275 mg/dL — ABNORMAL HIGH (ref 70–99)
Glucose-Capillary: 327 mg/dL — ABNORMAL HIGH (ref 70–99)
Glucose-Capillary: 368 mg/dL — ABNORMAL HIGH (ref 70–99)
Glucose-Capillary: 291 mg/dL — ABNORMAL HIGH (ref 70–99)
Glucose-Capillary: 374 mg/dL — ABNORMAL HIGH (ref 70–99)

## 2022-06-30 LAB — LACTIC ACID, PLASMA: Lactic Acid, Venous: 1.6 mmol/L (ref 0.5–1.9)

## 2022-06-30 LAB — PHOSPHORUS: Phosphorus: 2.2 mg/dL — ABNORMAL LOW (ref 2.5–4.6)

## 2022-06-30 LAB — MAGNESIUM: Magnesium: 1.5 mg/dL — ABNORMAL LOW (ref 1.7–2.4)

## 2022-06-30 MED ORDER — POTASSIUM CHLORIDE CRYS ER 20 MEQ PO TBCR
40.0000 meq | EXTENDED_RELEASE_TABLET | Freq: Once | ORAL | Status: AC
  Administered 2022-06-30: 40 meq via ORAL
  Filled 2022-06-30: qty 2

## 2022-06-30 MED ORDER — AMLODIPINE BESYLATE 10 MG PO TABS
10.0000 mg | ORAL_TABLET | Freq: Every day | ORAL | Status: DC
  Administered 2022-06-30 – 2022-07-03 (×4): 10 mg via ORAL
  Filled 2022-06-30 (×4): qty 1

## 2022-06-30 MED ORDER — PHENAZOPYRIDINE HCL 200 MG PO TABS
200.0000 mg | ORAL_TABLET | Freq: Three times a day (TID) | ORAL | Status: AC
  Administered 2022-06-30 – 2022-07-02 (×6): 200 mg via ORAL
  Filled 2022-06-30 (×6): qty 1

## 2022-06-30 MED ORDER — ACETAMINOPHEN 325 MG PO TABS: 650.0000 mg | ORAL_TABLET | Freq: Four times a day (QID) | ORAL | Status: DC | PRN

## 2022-06-30 MED ORDER — LISINOPRIL 20 MG PO TABS
20.0000 mg | ORAL_TABLET | Freq: Every day | ORAL | Status: DC
  Administered 2022-06-30: 20 mg via ORAL
  Filled 2022-06-30: qty 1

## 2022-06-30 MED ORDER — SODIUM CHLORIDE 0.9 % IV SOLN
1.0000 g | Freq: Once | INTRAVENOUS | Status: AC
  Administered 2022-06-30: 1 g via INTRAVENOUS
  Filled 2022-06-30: qty 10

## 2022-06-30 MED ORDER — ALUM & MAG HYDROXIDE-SIMETH 200-200-20 MG/5ML PO SUSP
30.0000 mL | Freq: Once | ORAL | Status: DC
  Filled 2022-06-30: qty 30

## 2022-06-30 MED ORDER — PANTOPRAZOLE SODIUM 40 MG PO TBEC
40.0000 mg | DELAYED_RELEASE_TABLET | Freq: Two times a day (BID) | ORAL | Status: DC
  Administered 2022-06-30 – 2022-07-03 (×7): 40 mg via ORAL
  Filled 2022-06-30 (×7): qty 1

## 2022-06-30 MED ORDER — LIDOCAINE VISCOUS HCL 2 % MT SOLN
15.0000 mL | Freq: Once | OROMUCOSAL | Status: AC
  Administered 2022-06-30: 15 mL via ORAL
  Filled 2022-06-30: qty 15

## 2022-06-30 MED ORDER — SODIUM CHLORIDE 0.9 % IV SOLN: INTRAVENOUS | Status: DC

## 2022-06-30 MED ORDER — ROSUVASTATIN CALCIUM 10 MG PO TABS
20.0000 mg | ORAL_TABLET | Freq: Every day | ORAL | Status: DC
  Administered 2022-06-30 – 2022-07-03 (×4): 20 mg via ORAL
  Filled 2022-06-30 (×4): qty 2

## 2022-06-30 MED ORDER — HYDROCHLOROTHIAZIDE 12.5 MG PO TABS: 12.5000 mg | ORAL_TABLET | Freq: Every day | ORAL | Status: DC

## 2022-06-30 MED ORDER — PAROXETINE HCL 20 MG PO TABS: 20.0000 mg | ORAL_TABLET | Freq: Every day | ORAL | Status: DC

## 2022-06-30 MED ORDER — SODIUM CHLORIDE 0.9 % IV BOLUS
1000.0000 mL | Freq: Once | INTRAVENOUS | Status: AC
  Administered 2022-06-30: 1000 mL via INTRAVENOUS

## 2022-06-30 MED ORDER — MAGNESIUM SULFATE 2 GM/50ML IV SOLN
2.0000 g | Freq: Once | INTRAVENOUS | Status: AC
  Administered 2022-06-30: 2 g via INTRAVENOUS
  Filled 2022-06-30: qty 50

## 2022-06-30 MED ORDER — INSULIN ASPART 100 UNIT/ML IJ SOLN
0.0000 [IU] | INTRAMUSCULAR | Status: DC
  Administered 2022-06-30: 11 [IU] via SUBCUTANEOUS
  Administered 2022-06-30 (×2): 8 [IU] via SUBCUTANEOUS
  Administered 2022-06-30 – 2022-07-01 (×4): 15 [IU] via SUBCUTANEOUS
  Administered 2022-07-01: 5 [IU] via SUBCUTANEOUS
  Administered 2022-07-01: 8 [IU] via SUBCUTANEOUS
  Administered 2022-07-01: 11 [IU] via SUBCUTANEOUS
  Administered 2022-07-02: 15 [IU] via SUBCUTANEOUS
  Administered 2022-07-02: 11 [IU] via SUBCUTANEOUS
  Administered 2022-07-02: 8 [IU] via SUBCUTANEOUS
  Administered 2022-07-02: 5 [IU] via SUBCUTANEOUS
  Administered 2022-07-02: 3 [IU] via SUBCUTANEOUS
  Administered 2022-07-02: 11 [IU] via SUBCUTANEOUS
  Administered 2022-07-03: 5 [IU] via SUBCUTANEOUS
  Administered 2022-07-03: 11 [IU] via SUBCUTANEOUS
  Administered 2022-07-03: 8 [IU] via SUBCUTANEOUS
  Administered 2022-07-03: 3 [IU] via SUBCUTANEOUS

## 2022-06-30 MED ORDER — AMLODIPINE BESYLATE 10 MG PO TABS: 10.0000 mg | ORAL_TABLET | Freq: Every day | ORAL | Status: DC

## 2022-06-30 MED ORDER — ALUM & MAG HYDROXIDE-SIMETH 200-200-20 MG/5ML PO SUSP
30.0000 mL | Freq: Once | ORAL | Status: AC
  Administered 2022-06-30: 30 mL via ORAL
  Filled 2022-06-30: qty 30

## 2022-06-30 MED ORDER — INSULIN DETEMIR 100 UNIT/ML ~~LOC~~ SOLN
10.0000 [IU] | Freq: Every day | SUBCUTANEOUS | Status: DC
  Administered 2022-06-30: 10 [IU] via SUBCUTANEOUS
  Filled 2022-06-30: qty 0.1

## 2022-06-30 MED ORDER — NICOTINE 21 MG/24HR TD PT24
21.0000 mg | MEDICATED_PATCH | Freq: Every day | TRANSDERMAL | Status: DC | PRN
  Filled 2022-06-30: qty 1

## 2022-06-30 MED ORDER — METOPROLOL TARTRATE 5 MG/5ML IV SOLN: 5.0000 mg | INTRAVENOUS | Status: DC | PRN

## 2022-06-30 MED ORDER — LISINOPRIL-HYDROCHLOROTHIAZIDE 20-25 MG PO TABS: 1.0000 | ORAL_TABLET | Freq: Every day | ORAL | Status: DC

## 2022-06-30 MED ORDER — OXYCODONE HCL 5 MG PO TABS: 5.0000 mg | ORAL_TABLET | ORAL | Status: DC | PRN

## 2022-06-30 MED ORDER — HYDROCHLOROTHIAZIDE 25 MG PO TABS
25.0000 mg | ORAL_TABLET | Freq: Every day | ORAL | Status: DC
  Administered 2022-06-30: 25 mg via ORAL
  Filled 2022-06-30: qty 1

## 2022-06-30 MED ORDER — SODIUM CHLORIDE 0.9 % IV SOLN
1.0000 g | INTRAVENOUS | Status: DC
  Administered 2022-07-01: 1 g via INTRAVENOUS
  Filled 2022-06-30: qty 10

## 2022-06-30 MED ORDER — LIDOCAINE VISCOUS HCL 2 % MT SOLN: 15.0000 mL | OROMUCOSAL | Status: DC | PRN

## 2022-06-30 MED ORDER — ENOXAPARIN SODIUM 40 MG/0.4ML IJ SOSY
40.0000 mg | PREFILLED_SYRINGE | INTRAMUSCULAR | Status: DC
  Administered 2022-07-01 – 2022-07-02 (×2): 40 mg via SUBCUTANEOUS
  Filled 2022-06-30 (×3): qty 0.4

## 2022-06-30 MED ORDER — MORPHINE SULFATE (PF) 2 MG/ML IV SOLN: 1.0000 mg | Freq: Once | INTRAVENOUS | Status: DC | PRN

## 2022-06-30 MED ORDER — POTASSIUM CHLORIDE IN NACL 20-0.9 MEQ/L-% IV SOLN
INTRAVENOUS | Status: AC
  Filled 2022-06-30 (×2): qty 1000

## 2022-06-30 MED ORDER — LACTATED RINGERS IV BOLUS
1000.0000 mL | Freq: Once | INTRAVENOUS | Status: AC
  Administered 2022-06-30: 1000 mL via INTRAVENOUS

## 2022-06-30 MED ORDER — MELATONIN 5 MG PO TABS
5.0000 mg | ORAL_TABLET | Freq: Once | ORAL | Status: AC
  Administered 2022-06-30: 5 mg via ORAL
  Filled 2022-06-30: qty 1

## 2022-06-30 MED ORDER — NYSTATIN 100000 UNIT/ML MT SUSP
5.0000 mL | Freq: Four times a day (QID) | OROMUCOSAL | Status: DC
  Administered 2022-06-30 – 2022-07-03 (×11): 500000 [IU] via ORAL
  Filled 2022-06-30 (×11): qty 5

## 2022-06-30 MED ORDER — ONDANSETRON HCL 4 MG/2ML IJ SOLN: 4.0000 mg | Freq: Four times a day (QID) | INTRAMUSCULAR | Status: DC | PRN

## 2022-06-30 MED ORDER — LISINOPRIL 20 MG PO TABS
20.0000 mg | ORAL_TABLET | Freq: Every day | ORAL | Status: DC
  Administered 2022-07-01 – 2022-07-03 (×3): 20 mg via ORAL
  Filled 2022-06-30 (×3): qty 1

## 2022-06-30 NOTE — ED Notes (Signed)
Repaged Hospitalist for ED consult via Tammy at [facility]

## 2022-06-30 NOTE — ED Notes (Signed)
Checked CBG 335

## 2022-06-30 NOTE — Progress Notes (Signed)
Hospitalist Transfer Note:  Transferring facility: [facility] Requesting provider: Dr. Randal Buba (EDP at [facility]) Reason for transfer: admission for further evaluation and management of right-sided pyelonephritis.     42 y.o.  F, with type 2 diabetes mellitus, hyperlipidemia, who presented to [place] ED complaining of right lower quadrant abdominal discomfort over the last few days.   Vital signs in the ED were notable for the following: Afebrile, but mildly tachycardic, improving with IV fluids.  Normotensive.  Labs were notable for urinalysis was reported to be consistent with urinary tract infection.  CMP showed elevated blood sugar greater than 400, but non-elevated  anion gap.   Imaging notable for CT abdomen/pelvis which was suggestive of right-sided pyelonephritis, but also showed right renal findings that may be suggestive of microcystic changes versus potential renal abscess.   Medications administered prior to transfer included the following: Rocephin.  Patient to receive additional IV fluids as well.  Additionally, patient was made n.p.o. starting at 5 AM on 06/30/2022 in case she needs IR procedure for further evaluation of the aforementioned right renal findings.   Subsequently, I accepted this patient for transfer for inpatient admission to a med/tele bed at [facility] or [facility] (first available) for further work-up and management of the above.      Check [email or web address] for on-call coverage.   Nursing staff, Please call [place] number on Amion as soon as patient's arrival, so appropriate admitting provider can evaluate the pt.     Babs Bertin, DO Hospitalist

## 2022-06-30 NOTE — Progress Notes (Signed)
Pt refused assessments and cardiac monitor.

## 2022-06-30 NOTE — Plan of Care (Signed)

## 2022-06-30 NOTE — ED Notes (Signed)
Called [facility] for transport- bed ready #[id number] at [facility]

## 2022-06-30 NOTE — Inpatient Diabetes Management (Signed)
Inpatient Diabetes Program Recommendations  AACE/ADA: New Consensus Statement on Inpatient Glycemic Control (2015)  Target Ranges:  Prepandial:   less than 140 mg/dL      Peak postprandial:   less than 180 mg/dL (1-2 hours)      Critically ill patients:  140 - 180 mg/dL   Lab Results  Component Value Date   PTWSFK 812 (H) 06/30/2022   HGBA1C 7.2 (A) 01/30/2022    Review of Glycemic Control  Diabetes history: DM2 Outpatient Diabetes medications: metformin 500 BID Current orders for Inpatient glycemic control: Levemir 10 QHS, Novolog 0-15 Q4H  CBGs 335, 327 this am. BHB - 1.99, CO2 21  Inpatient Diabetes Program Recommendations:    Needs updated HgbA1C. Last one 7.2% on 01/30/22.  May need IV insulin. Recheck BHB.  Continue to follow.  Thank you. Lorenda Peck, RD, LDN, [place] Inpatient Diabetes Coordinator [phone]

## 2022-06-30 NOTE — ED Notes (Signed)
Pt requesting something to drink, NPO per MD, informed pt, v/u

## 2022-06-30 NOTE — ED Notes (Addendum)
EDP gave permission for pt to drink and eat (low carb) until 5am.   Pt asked for water and agreed to Chicken noodle soup.

## 2022-06-30 NOTE — H&P (Signed)
History and Physical    Patient: Samantha [name] XLK:[id number] DOB: [birth year] DOA: 06/29/2022 DOS: the patient was seen and examined on 06/30/2022 PCP: [name], NP  Patient coming from: Home  Chief Complaint:  Chief Complaint  Patient presents with   Abdominal Pain   HPI: Samantha [name] is a 42 y.o. female with medical history significant of type 2 diabetes, hyperlipidemia, hypertension, active tobacco use, fatty liver disease who presented to the emergency department with history of feeling ill since Thursday evening associated with chills, dysuria, right lower quadrant, right flank and right lower back pain, decreased appetite only unable to have fluids.  She has been having odynophagia when swallowing for the past few days.  She denied fever, night sweats, rhinorrhea, sore throat, wheezing or hemoptysis.  No typical chest pain, palpitations, diaphoresis, PND, orthopnea or pitting edema of the lower extremities.  No emesis, diarrhea, constipation, melena or hematochezia. No polyuria, polydipsia, polyphagia or blurred vision.   Lab work: Urinalysis with glucosuria more than 500 and ketonuria 50 mg deciliter with trace hemoglobinuria, many bacteria on microscopic examination along with 11-20 WBC and WBC clumps.  CBC showed a white count of 17.7, hemoglobin 13.7 g/dL platelets 346.  CMP with a sodium 125 (corrected to glucose level 134), potassium 3.3, chloride 88 and CO2 23 mmol/L.  Glucose 488 mg/dL and albumin 3.0 g/dL.  Calcium, renal function and the rest of the hepatic functions were normal.  Normal lipase.  Negative pregnancy urine test.  Negative coronavirus, influenza A/B and RSV PCR.  Imaging: Portable 1 view chest radiograph with no evidence of acute cardiopulmonary disease.  CT abdomen/pelvis with contrast with findings compatible with focal right-sided pyelonephritis.  Tiny microcytic areas in the region of infection may represent small abscesses.  Recommend follow-up to  confirm complete resolution and to exclude underlying lesion.  There is also hepatic steatosis.  ED course: Initial vital signs were temperature 98.3 F, pulse 127, respiration 18, BP 133/90 mmHg O2 sat 99% on room air.   Review of Systems: As mentioned in the history of present illness. All other systems reviewed and are negative.  Past Medical History:  Diagnosis Date   Diabetes mellitus without complication ([place])    Hyperlipidemia    Hypertension    History reviewed. No pertinent surgical history. Social History:  reports that she has been smoking cigarettes. She has a 0.20 pack-year smoking history. She has never used smokeless tobacco. She reports that she does not drink alcohol and does not use drugs.  No Known Allergies  Family History  Problem Relation Age of Onset   Cancer Father     Prior to Admission medications   Medication Sig Start Date End Date Taking? Authorizing Provider  albuterol (VENTOLIN HFA) 108 (90 Base) MCG/ACT inhaler Inhale 2 puffs into the lungs every 6 (six) hours as needed for wheezing or shortness of breath. 08/13/21   [name], NP  amLODipine (NORVASC) 10 MG tablet Take 1 tablet (10 mg total) by mouth daily. 01/27/22 07/09/22  Elsie Stain, MD  blood glucose meter kit and supplies Dispense based on patient and insurance preference. Use up to two times daily as directed. ICD-10 E11.65. 11/20/19   [name], NP  Blood Pressure Monitor DEVI Please provide patient with insurance approved blood pressure monitor 12/24/18   [name], NP  busPIRone (BUSPAR) 5 MG tablet Take 2 tablets (10 mg total) by mouth 3 (three) times daily as needed. 08/13/21   Geryl Rankins  W, NP  Dulaglutide (TRULICITY) 6.28 BT/5.1VO SOPN Inject 0.75 mg into the skin once a week. 04/10/22   [name], NP  etonogestrel-ethinyl estradiol (NUVARING) 0.12-0.015 MG/24HR vaginal ring Insert vaginally and leave in place for 3 consecutive weeks, then remove for 1 week.  11/21/21   [name], NP  lisinopril-hydrochlorothiazide (ZESTORETIC) 20-25 MG tablet Take 1 tablet by mouth daily. 08/13/21   [name], NP  metFORMIN (GLUCOPHAGE-XR) 500 MG 24 hr tablet Take 1 tablet (500 mg total) by mouth 2 (two) times daily with a meal. 04/20/22   [name], NP  Misc. Devices MISC Please provide patient with insurance approved blood pressure monitor 12/24/18   [name], NP  ondansetron (ZOFRAN-ODT) 4 MG disintegrating tablet Dissolve 1 tablet by mouth every 8 hour for up to 7 days as needed for nausea. 01/23/22     PARoxetine (PAXIL) 20 MG tablet Take 1 tablet (20 mg total) by mouth daily. 01/27/22   Elsie Stain, MD  rosuvastatin (CRESTOR) 20 MG tablet Take 1 tablet (20 mg total) by mouth daily. 01/27/22 01/27/23  Elsie Stain, MD  Saxagliptin-Metformin (KOMBIGLYZE XR) 09-998 MG TB24 Take 1 tablet by mouth daily. 01/27/22   Elsie Stain, MD    Physical Exam: Vitals:   06/30/22 0145 06/30/22 0200 06/30/22 0207 06/30/22 0254  BP: 121/86 113/86  (!) 145/87  Pulse: 96 (!) 102  100  Resp: '18 14  19  '$ Temp:   98 F (36.7 C) 97.8 F (36.6 C)  TempSrc:   Oral   SpO2: 98% 95%  100%  Weight:      Height:       Physical Exam Vitals and nursing note reviewed.  Constitutional:      General: She is awake. She is not in acute distress.    Appearance: She is well-developed. She is obese.  HENT:     Head: Normocephalic.     Nose: No rhinorrhea.     Mouth/Throat:     Mouth: Mucous membranes are moist.     Pharynx: No oropharyngeal exudate.  Eyes:     General: No scleral icterus.    Pupils: Pupils are equal, round, and reactive to light.  Neck:     Vascular: No JVD.  Cardiovascular:     Rate and Rhythm: Normal rate and regular rhythm.     Heart sounds: S1 normal and S2 normal.  Pulmonary:     Effort: Pulmonary effort is normal.     Breath sounds: Normal breath sounds. No wheezing, rhonchi or rales.  Abdominal:     General: Abdomen  is flat.     Palpations: Abdomen is soft.     Tenderness: There is abdominal tenderness in the right lower quadrant. There is right CVA tenderness. There is no left CVA tenderness, guarding or rebound.  Musculoskeletal:     Cervical back: Neck supple.     Right lower leg: No edema.     Left lower leg: No edema.  Skin:    General: Skin is warm and dry.  Neurological:     General: No focal deficit present.     Mental Status: She is alert and oriented to person, place, and time.  Psychiatric:        Mood and Affect: Mood normal.        Behavior: Behavior normal. Behavior is cooperative.     Data Reviewed:  Results are pending, will review when available.  Assessment and Plan:  Principal Problem:   Acute pyelonephritis Admit to MedSurg/inpatient. Continue IV fluids. Continue ceftriaxone 1 g IVPB daily. Analgesics as needed. Antiemetics as needed. Follow-up urine culture and sensitivity. Follow-up blood culture and sensitivity. Follow-up CBC and chemistry in the morning.  Active Problems:   Odynophagia  Begin pantoprazole 40 mg p.o. twice daily. Maalox plus viscous lidocaine as needed. On call GI also recommended trial of nystatin swish and swallow. Consult GI if no resolution. Should follow-up with them as an outpatient.    Non-alcoholic fatty liver disease  Has made some progress with obesity. Continue lifestyle modifications for weight loss. Avoid alcoholic beverages. Follow-up with PCP and GI as an outpatient.    Hyponatremia Mostly pseudohyponatremia. Uses hydrochlorothiazide. Continue normal saline infusion. Decrease HCTZ to 12.5 mg p.o. daily.    Hypomagnesemia Secondary to HCTZ use. Replacement ordered. HCTZ decreased to 12.5 mg daily. Consider magnesium oxide supplementation.    Hypokalemia In the setting of diuretic use. Replacing orally and through IV fluids. Will likely need regular KCl replacement.    Hypophosphatemia Replacing. Follow-up  phosphorus level as needed.    Hypocalcemia Recheck calcium with albumin level in AM. Further workup depending on results. Along with hypophosphatemia, it is suspicious for vitamin D deficiency.    Essential hypertension Continue amlodipine 10 mg p.o. daily. Continue lisinopril 20 mg p.o. daily. Continue HCT 25 mg p.o. daily. Will need regular KCl and mag oxide supplementation.    Type 2 diabetes mellitus with hyperglycemia (HCC) Carbohydrate modified diet. Continue IV fluids. CBG monitoring with [facility] SS.    Hyperlipidemia associated with type 2 diabetes mellitus (HCC) Continue rosuvastatin 20 mg p.o. daily.    Class 1 obesity  Current BMI 31.28 kg/m. Continue lifestyle modifications. Follow-up closely with primary care provider.    Protein-calorie malnutrition, mild ([place]) In the setting of acute illness. Protein supplementation. Close microalbuminuria monitoring with PCP.    Normocytic anemia No melena or hematochezia. Menstrual loss? Monitor hematocrit and hemoglobin. Transfuse as needed.      Advance Care Planning:   Code Status: Full Code   Consults:   Family Communication:   Severity of Illness: The appropriate patient status for this patient is INPATIENT. Inpatient status is judged to be reasonable and necessary in order to provide the required intensity of service to ensure the patient's safety. The patient's presenting symptoms, physical exam findings, and initial radiographic and laboratory data in the context of their chronic comorbidities is felt to place them at high risk for further clinical deterioration. Furthermore, it is not anticipated that the patient will be medically stable for discharge from the hospital within 2 midnights of admission.   * I certify that at the point of admission it is my clinical judgment that the patient will require inpatient hospital care spanning beyond 2 midnights from the point of admission due to high intensity of  service, high risk for further deterioration and high frequency of surveillance required.*  Author: Reubin Milan, MD 06/30/2022 7:17 AM  For on call review [email or web address].   This document was prepared using Dragon voice recognition software and may contain some unintended transcription errors.

## 2022-07-01 DIAGNOSIS — R1310 Dysphagia, unspecified: Secondary | ICD-10-CM

## 2022-07-01 DIAGNOSIS — N10 Acute tubulo-interstitial nephritis: Secondary | ICD-10-CM | POA: Diagnosis not present

## 2022-07-01 DIAGNOSIS — E669 Obesity, unspecified: Secondary | ICD-10-CM

## 2022-07-01 DIAGNOSIS — E876 Hypokalemia: Secondary | ICD-10-CM

## 2022-07-01 DIAGNOSIS — E1169 Type 2 diabetes mellitus with other specified complication: Secondary | ICD-10-CM

## 2022-07-01 DIAGNOSIS — E1165 Type 2 diabetes mellitus with hyperglycemia: Secondary | ICD-10-CM | POA: Diagnosis not present

## 2022-07-01 DIAGNOSIS — K760 Fatty (change of) liver, not elsewhere classified: Secondary | ICD-10-CM

## 2022-07-01 DIAGNOSIS — D649 Anemia, unspecified: Secondary | ICD-10-CM | POA: Diagnosis not present

## 2022-07-01 DIAGNOSIS — I10 Essential (primary) hypertension: Secondary | ICD-10-CM

## 2022-07-01 DIAGNOSIS — E871 Hypo-osmolality and hyponatremia: Secondary | ICD-10-CM | POA: Diagnosis not present

## 2022-07-01 DIAGNOSIS — E785 Hyperlipidemia, unspecified: Secondary | ICD-10-CM

## 2022-07-01 LAB — COMPREHENSIVE METABOLIC PANEL
ALT: 16 U/L (ref 0–44)
AST: 26 U/L (ref 15–41)
Albumin: 2.2 g/dL — ABNORMAL LOW (ref 3.5–5.0)
Alkaline Phosphatase: 49 U/L (ref 38–126)
Anion gap: 10 (ref 5–15)
BUN: 8 mg/dL (ref 6–20)
CO2: 23 mmol/L (ref 22–32)
Calcium: 8.3 mg/dL — ABNORMAL LOW (ref 8.9–10.3)
Chloride: 101 mmol/L (ref 98–111)
Creatinine, Ser: 0.61 mg/dL (ref 0.44–1.00)
GFR, Estimated: >60 mL/min (ref >60)
Glucose, Bld: 245 mg/dL — ABNORMAL HIGH (ref 70–99)
Potassium: 3.5 mmol/L (ref 3.5–5.1)
Sodium: 134 mmol/L — ABNORMAL LOW (ref 135–145)
Total Bilirubin: 0.4 mg/dL (ref 0.3–1.2)
Total Protein: 6.2 g/dL — ABNORMAL LOW (ref 6.5–8.1)

## 2022-07-01 LAB — CBC
HCT: 34.7 % — ABNORMAL LOW (ref 36.0–46.0)
Hemoglobin: 11.7 g/dL — ABNORMAL LOW (ref 12.0–15.0)
MCH: 28.9 pg (ref 26.0–34.0)
MCHC: 33.7 g/dL (ref 30.0–36.0)
MCV: 85.7 fL (ref 80.0–100.0)
Platelets: 284 10*3/uL (ref 150–400)
RBC: 4.05 MIL/uL (ref 3.87–5.11)
RDW: 13.3 % (ref 11.5–15.5)
WBC: 17.1 10*3/uL — ABNORMAL HIGH (ref 4.0–10.5)
nRBC: 0 % (ref 0.0–0.2)

## 2022-07-01 LAB — GLUCOSE, CAPILLARY
Glucose-Capillary: 294 mg/dL — ABNORMAL HIGH (ref 70–99)
Glucose-Capillary: 207 mg/dL — ABNORMAL HIGH (ref 70–99)
Glucose-Capillary: 398 mg/dL — ABNORMAL HIGH (ref 70–99)
Glucose-Capillary: 353 mg/dL — ABNORMAL HIGH (ref 70–99)
Glucose-Capillary: 304 mg/dL — ABNORMAL HIGH (ref 70–99)

## 2022-07-01 LAB — HIV ANTIBODY (ROUTINE TESTING W REFLEX): HIV Screen 4th Generation wRfx: NONREACTIVE

## 2022-07-01 LAB — HEMOGLOBIN A1C
Hgb A1c MFr Bld: 9.7 % — ABNORMAL HIGH (ref 4.8–5.6)
Mean Plasma Glucose: 231.69 mg/dL

## 2022-07-01 LAB — MAGNESIUM: Magnesium: 1.8 mg/dL (ref 1.7–2.4)

## 2022-07-01 LAB — PHOSPHORUS: Phosphorus: 2.3 mg/dL — ABNORMAL LOW (ref 2.5–4.6)

## 2022-07-01 MED ORDER — LACTATED RINGERS IV SOLN: INTRAVENOUS | Status: DC

## 2022-07-01 MED ORDER — MAGNESIUM SULFATE 2 GM/50ML IV SOLN
2.0000 g | Freq: Once | INTRAVENOUS | Status: AC
  Administered 2022-07-01: 2 g via INTRAVENOUS
  Filled 2022-07-01: qty 50

## 2022-07-01 MED ORDER — K PHOS MONO-SOD PHOS DI & MONO 155-852-130 MG PO TABS
500.0000 mg | ORAL_TABLET | Freq: Once | ORAL | Status: AC
  Administered 2022-07-02: 500 mg via ORAL
  Filled 2022-07-01: qty 2

## 2022-07-01 MED ORDER — INSULIN DETEMIR 100 UNIT/ML ~~LOC~~ SOLN: 5.0000 [IU] | Freq: Once | SUBCUTANEOUS | Status: DC

## 2022-07-01 MED ORDER — POTASSIUM CHLORIDE CRYS ER 20 MEQ PO TBCR
40.0000 meq | EXTENDED_RELEASE_TABLET | Freq: Once | ORAL | Status: AC
  Administered 2022-07-01: 40 meq via ORAL
  Filled 2022-07-01: qty 2

## 2022-07-01 MED ORDER — INSULIN DETEMIR 100 UNIT/ML ~~LOC~~ SOLN
15.0000 [IU] | Freq: Every day | SUBCUTANEOUS | Status: DC
  Administered 2022-07-01: 15 [IU] via SUBCUTANEOUS
  Filled 2022-07-01 (×2): qty 0.15

## 2022-07-01 MED ORDER — SODIUM CHLORIDE 0.9 % IV SOLN
2.0000 g | INTRAVENOUS | Status: DC
  Administered 2022-07-01: 2 g via INTRAVENOUS
  Filled 2022-07-01: qty 20

## 2022-07-01 MED ORDER — POTASSIUM PHOSPHATES 15 MMOLE/5ML IV SOLN
30.0000 mmol | Freq: Once | INTRAVENOUS | Status: AC
  Administered 2022-07-01: 30 mmol via INTRAVENOUS
  Filled 2022-07-01: qty 10

## 2022-07-01 NOTE — Progress Notes (Signed)
PROGRESS NOTE    Samantha [name]  CZY:[id number] DOB: 10-Aug-1980 DOA: 06/29/2022 PCP: [name], NP    Chief Complaint  Patient presents with   Abdominal Pain    Brief Narrative:  Patient is a pleasant 42 year old female history of type 2 diabetes, hyperlipidemia, hypertension, ongoing tobacco use, fatty liver disease presented to the ED with chills, dysuria, right lower quadrant and right flank pain and right lower back pain with decreased oral intake.  Urinalysis done with WBC clumps, 11-20 WBCs, patient noted to have a leukocytosis with a white count of 17.7, influenza A and B-, COVID-19 PCR negative, RSV PCR negative.  Chest x-ray with no acute findings.  CT abdomen and pelvis concerning for focal right-sided pyelonephritis with tiny microcytic areas in the region of infection may represent small abscesses.  Patient pancultured and placed empirically on IV Rocephin.   Assessment & Plan:   Principal Problem:   Acute pyelonephritis Active Problems:   Essential hypertension   Type 2 diabetes mellitus with hyperglycemia (HCC)   Hyperlipidemia associated with type 2 diabetes mellitus (HCC)   Hypomagnesemia   Hypokalemia   Hypophosphatemia   Hypocalcemia   Protein-calorie malnutrition, mild (HCC)   Normocytic anemia   Hyponatremia   Odynophagia   Non-alcoholic fatty liver disease   Class 1 obesity   #1 acute pyelonephritis -Patient admitted with chills, leukocytosis, right flank, right lower back, right lower abdominal pain. -Urinalysis with glycosuria, trace hemoglobin, 15 ketones, nitrite negative, leukocytes negative, WBC clumps, 11-20 WBCs. -CT abdomen and pelvis concerning for focal right pyelonephritis with tiny microcytic areas in the region of infection may represent small abscesses. -Patient with a leukocytosis, currently afebrile. -Urine cultures preliminary with >100,000 colonies of Klebsiella pneumonia with sensitivities pending. -Increase IV Rocephin to 2 g  daily. -Supportive care, pain management.  2.  Odynophagia -Continue PPI twice daily, Maalox with viscous lidocaine as needed. -Admitting physician discussed with GI who recommended trial of nystatin swish and swallow and if notes improvement will need GI consultation with outpatient follow-up.  3.  Nonalcoholic fatty liver disease -Patient noted to have made some progress with obesity. -Continue lifestyle modifications. -Statin. -Outpatient follow-up with PCP.  4.  Pseudohyponatremia -Patient noted to be on HCTZ prior to admission which will be discontinued on discharge. -DC HCTZ. -Hyponatremia improved.  5.  Hypomagnesemia/hypokalemia -In the setting of diuretic use. -Discontinue HCTZ. -Potassium at 3.5, magnesium at 1.8. -K-Dur 40 mEq p.o. x 1.  6.  Hypophosphatemia -Replete.  7.  Diabetes mellitus type 2 with hyperglycemia -Elevated CBGs likely secondary to acute infection. -Hemoglobin A1c 9.7 (07/01/2022) -Increase Levemir to 15 units daily. -SSI. -Outpatient follow-up with PCP.  8.  Hyperlipidemia -Statin.  9.  Hypertension -Continue Norvasc 10 mg daily, lisinopril 20 mg daily. -Discontinue HCTZ due to electrolyte abnormalities.  10.  Class I obesity -BMI 31.28 kg/m. -Lifestyle modification. -Outpatient follow-up with PCP.  11.  Normocytic anemia -Patient with no overt bleeding. -Hemoglobin stable at 11.7.   DVT prophylaxis: Lovenox Code Status: Full Family Communication: Updated patient.  No family at bedside. Disposition: Home when clinically improved, cultures have resulted with sensitivities hopefully in the next 24 hours.  Status is: Inpatient Remains inpatient appropriate because: Severity of illness   Consultants:  None  Procedures:  CT abdomen and pelvis 06/29/2022   Antimicrobials:  IV Rocephin 06/30/2022>>>>>   Subjective: Laying in bed.  Overall feeling better than on admission.  Denies any chest pain.  No shortness of breath.   Frustrated that  every time she tries to rest somebody keeps coming in the room.  Asking when she is going to be able to go home.  States dysuria, right flank/right-sided abdominal pain improved.  Tolerating current diet.  Concerned about her elevated blood glucose levels.  Objective: Vitals:   06/30/22 0947 06/30/22 1337 06/30/22 1951 07/01/22 0431  BP: 117/76 107/66 128/78 117/84  Pulse: (!) 117 (!) 104 (!) 105 (!) 105  Resp: '20 18 20 20  '$ Temp: 98 F (36.7 C) 98.5 F (36.9 C) 98.7 F (37.1 C) 98.3 F (36.8 C)  TempSrc: Oral Oral  Oral  SpO2: 97% 98% 99% 97%  Weight:      Height:        Intake/Output Summary (Last 24 hours) at 07/01/2022 1326 Last data filed at 07/01/2022 0543 Gross per 24 hour  Intake 917.37 ml  Output --  Net 917.37 ml   Filed Weights   06/29/22 2036  Weight: 77.6 kg    Examination:  General exam: Appears calm and comfortable  Respiratory system: Clear to auscultation. Respiratory effort normal. Cardiovascular system: S1 & S2 heard, RRR. No JVD, murmurs, rubs, gallops or clicks. No pedal edema. Gastrointestinal system: Abdomen is nondistended, soft and nontender. No organomegaly or masses felt. Normal bowel sounds heard.  No CVA tenderness. Central nervous system: Alert and oriented. No focal neurological deficits. Extremities: Symmetric 5 x 5 power. Skin: No rashes, lesions or ulcers Psychiatry: Judgement and insight appear normal. Mood & affect appropriate.     Data Reviewed: I have personally reviewed following labs and imaging studies  CBC: Recent Labs  Lab 06/29/22 2046 06/29/22 2314 06/30/22 0532 07/01/22 0618  WBC 17.7*  --  16.5* 17.1*  NEUTROABS  --   --  13.3*  --   HGB 13.7 13.6 11.6* 11.7*  HCT 38.9 40.0 34.5* 34.7*  MCV 81.4  --  85.6 85.7  PLT 346  --  275 510    Basic Metabolic Panel: Recent Labs  Lab 06/29/22 2046 06/29/22 2314 06/30/22 0532 07/01/22 0618  NA 125* 129* 131* 134*  K 3.3* 3.1* 3.1* 3.5  CL 88*  --   97* 101  CO2 23  --  21* 23  GLUCOSE 488*  --  335* 245*  BUN 12  --  8 8  CREATININE 0.82  --  0.61 0.61  CALCIUM 9.0  --  7.9* 8.3*  MG  --   --  1.5* 1.8  PHOS  --   --  2.2* 2.3*    GFR: Estimated Creatinine Clearance: 89.3 mL/min (by C-G formula based on SCr of 0.61 mg/dL).  Liver Function Tests: Recent Labs  Lab 06/29/22 2046 07/01/22 0618  AST 31 26  ALT 22 16  ALKPHOS 80 49  BILITOT 0.8 0.4  PROT 8.0 6.2*  ALBUMIN 3.0* 2.2*    CBG: Recent Labs  Lab 06/30/22 1609 06/30/22 1948 07/01/22 0428 07/01/22 0733 07/01/22 1216  GLUCAP 291* 374* 294* 207* 398*     Recent Results (from the past 240 hour(s))  Resp panel by RT-PCR (RSV, Flu A&B, Covid) Nasopharyngeal Swab     Status: None   Collection Time: 06/29/22  8:40 PM   Specimen: Nasopharyngeal Swab; Nasal Swab  Result Value Ref Range Status   SARS Coronavirus 2 by RT PCR NEGATIVE NEGATIVE Final    Comment: (NOTE) SARS-CoV-2 target nucleic acids are NOT DETECTED.  The SARS-CoV-2 RNA is generally detectable in upper respiratory specimens during the acute phase of infection. The  lowest concentration of SARS-CoV-2 viral copies this assay can detect is 138 copies/mL. A negative result does not preclude SARS-Cov-2 infection and should not be used as the sole basis for treatment or other patient management decisions. A negative result may occur with  improper specimen collection/handling, submission of specimen other than nasopharyngeal swab, presence of viral mutation(s) within the areas targeted by this assay, and inadequate number of viral copies(<138 copies/mL). A negative result must be combined with clinical observations, patient history, and epidemiological information. The expected result is Negative.  Fact Sheet for Patients:  EntrepreneurPulse.com.au  Fact Sheet for Healthcare Providers:  IncredibleEmployment.be  This test is no t yet approved or cleared by the  [place] FDA and  has been authorized for detection and/or diagnosis of SARS-CoV-2 by FDA under an Emergency Use Authorization (EUA). This EUA will remain  in effect (meaning this test can be used) for the duration of the COVID-19 declaration under Section 564(b)(1) of the Act, 21 U.S.C.section 360bbb-3(b)(1), unless the authorization is terminated  or revoked sooner.       Influenza A by PCR NEGATIVE NEGATIVE Final   Influenza B by PCR NEGATIVE NEGATIVE Final    Comment: (NOTE) The Xpert Xpress SARS-CoV-2/FLU/RSV plus assay is intended as an aid in the diagnosis of influenza from Nasopharyngeal swab specimens and should not be used as a sole basis for treatment. Nasal washings and aspirates are unacceptable for Xpert Xpress SARS-CoV-2/FLU/RSV testing.  Fact Sheet for Patients: EntrepreneurPulse.com.au  Fact Sheet for Healthcare Providers: IncredibleEmployment.be  This test is not yet approved or cleared by the [place] FDA and has been authorized for detection and/or diagnosis of SARS-CoV-2 by FDA under an Emergency Use Authorization (EUA). This EUA will remain in effect (meaning this test can be used) for the duration of the COVID-19 declaration under Section 564(b)(1) of the Act, 21 U.S.C. section 360bbb-3(b)(1), unless the authorization is terminated or revoked.     Resp Syncytial Virus by PCR NEGATIVE NEGATIVE Final    Comment: (NOTE) Fact Sheet for Patients: EntrepreneurPulse.com.au  Fact Sheet for Healthcare Providers: IncredibleEmployment.be  This test is not yet approved or cleared by the [place] FDA and has been authorized for detection and/or diagnosis of SARS-CoV-2 by FDA under an Emergency Use Authorization (EUA). This EUA will remain in effect (meaning this test can be used) for the duration of the COVID-19 declaration under Section 564(b)(1) of the Act, 21 U.S.C. section  360bbb-3(b)(1), unless the authorization is terminated or revoked.  Performed at [facility], [address] [place], [place] [address]   Urine Culture (for pregnant, neutropenic or urologic patients or patients with an indwelling urinary catheter)     Status: Abnormal (Preliminary result)   Collection Time: 06/29/22  8:45 PM   Specimen: Urine, Clean Catch  Result Value Ref Range Status   Specimen Description   Final    URINE, CLEAN CATCH Performed at [facility], [address] [place], [place] [address]    Special Requests   Final    NONE Performed at [facility], [address] [place], [place] [address]    Culture (A)  Final    >=100,000 COLONIES/mL KLEBSIELLA PNEUMONIAE SUSCEPTIBILITIES TO FOLLOW Performed at [facility], [place] [address], [place],  [address]    Report Status PENDING  Incomplete  Blood culture (routine x 2)     Status: None (Preliminary result)   Collection Time: 06/30/22 12:06 AM  Specimen: BLOOD  Result Value Ref Range Status   Specimen Description   Final    BLOOD LEFT ANTECUBITAL Performed at [facility], [address] [place], [place] [address]    Special Requests   Final    BOTTLES DRAWN AEROBIC AND ANAEROBIC Blood Culture adequate volume Performed at [facility], [address] [place], [place] [address]    Culture   Final    NO GROWTH < 24 HOURS Performed at [facility], [place] [address], [place], [place] [address]    Report Status PENDING  Incomplete  Blood culture (routine x 2)     Status: None (Preliminary result)   Collection Time: 06/30/22 12:14 AM   Specimen: BLOOD LEFT FOREARM  Result Value Ref Range Status   Specimen Description   Final    BLOOD LEFT FOREARM Performed at [facility], [address] [place], [place] [address]    Special Requests   Final    BOTTLES DRAWN AEROBIC AND ANAEROBIC Blood Culture adequate volume Performed at  [facility], [address] [place], [place] [address]    Culture   Final    NO GROWTH < 24 HOURS Performed at [facility], [place] [address], [place], [place] [address]    Report Status PENDING  Incomplete         Radiology Studies: CT ABDOMEN PELVIS W CONTRAST  Result Date: 06/29/2022 CLINICAL DATA:  Right lower quadrant abdominal pain. Burning with urination. EXAM: CT ABDOMEN AND PELVIS WITH CONTRAST TECHNIQUE: Multidetector CT imaging of the abdomen and pelvis was performed using the standard protocol following bolus administration of intravenous contrast. RADIATION DOSE REDUCTION: This exam was performed according to the departmental dose-optimization program which includes automated exposure control, adjustment of the mA and/or kV according to patient size and/or use of iterative reconstruction technique. CONTRAST:  118m OMNIPAQUE IOHEXOL 300 MG/ML  SOLN COMPARISON:  None Available. FINDINGS: Lower chest: No acute abnormality. Hepatobiliary: There is diffuse fatty infiltration of the liver. Gallbladder and bile ducts are within normal limits. Pancreas: Unremarkable. No pancreatic ductal dilatation or surrounding inflammatory changes. Spleen: Normal in size without focal abnormality. Adrenals/Urinary Tract: There is an ill-defined patchy mixed hypodense and hyperdense area in the anterior right kidney measuring proximally 2.7 by 2.3 by 3.0 cm. There is surrounding inflammatory stranding. Microcystic areas may be present internally best seen on coronal image 5/44. There is no hydronephrosis. There is mild enhancement of the proximal right ureter. The adrenal glands, left kidney and bladder are within normal limits. Stomach/Bowel: Stomach is within normal limits. Appendix appears normal. No evidence of bowel wall thickening, distention, or inflammatory changes. Vascular/Lymphatic: No significant vascular findings are present. No enlarged abdominal or pelvic lymph nodes.  Reproductive: Uterus and bilateral adnexa are unremarkable. Other: No abdominal wall hernia or abnormality. No abdominopelvic ascites. Musculoskeletal: There are degenerative changes at L5-S1. IMPRESSION: 1. Findings compatible with focal right-sided pyelonephritis. Tiny microcystic areas in the region of infection may represent small abscesses. Recommend follow-up to confirm complete resolution and to exclude underlying lesion. 2. Fatty infiltration of the liver. These results were called by telephone at the time of interpretation on 06/29/2022 at 11:39 pm to [facility] who verbally acknowledged these results. Electronically Signed   By: ARonney AstersM.D.   On: 06/29/2022 23:39   DG Chest Portable 1 View  Result Date: 06/29/2022 CLINICAL DATA:  Cough, shortness of breath  EXAM: PORTABLE CHEST 1 VIEW COMPARISON:  04/01/2022 FINDINGS: Lungs are clear.  No pleural effusion or pneumothorax. The heart is normal in size. IMPRESSION: No evidence of acute cardiopulmonary disease. Electronically Signed   By: Julian Hy M.D.   On: 06/29/2022 22:49        Scheduled Meds:  amLODipine  10 mg Oral Daily   enoxaparin (LOVENOX) injection  40 mg Subcutaneous Q24H   insulin aspart  0-15 Units Subcutaneous Q4H   insulin detemir  15 Units Subcutaneous Daily   lisinopril  20 mg Oral Daily   nystatin  5 mL Oral QID   pantoprazole  40 mg Oral BID   phenazopyridine  200 mg Oral TID WC   rosuvastatin  20 mg Oral Daily   Continuous Infusions:  cefTRIAXone (ROCEPHIN)  IV     lactated ringers 125 mL/hr at 07/01/22 1022     LOS: 1 day    Time spent: 40 minutes    Irine Seal, MD [facility]   To contact the attending provider between 7A-7P or the covering provider during after hours 7P-7A, please log into the web site [email or web address] and access using universal [place] password for that web site. If you do not have the password, please call the hospital  operator.  07/01/2022, 1:26 PM

## 2022-07-02 DIAGNOSIS — N10 Acute tubulo-interstitial nephritis: Secondary | ICD-10-CM | POA: Diagnosis not present

## 2022-07-02 DIAGNOSIS — E871 Hypo-osmolality and hyponatremia: Secondary | ICD-10-CM | POA: Diagnosis not present

## 2022-07-02 DIAGNOSIS — I10 Essential (primary) hypertension: Secondary | ICD-10-CM | POA: Diagnosis not present

## 2022-07-02 DIAGNOSIS — D649 Anemia, unspecified: Secondary | ICD-10-CM | POA: Diagnosis not present

## 2022-07-02 DIAGNOSIS — E1165 Type 2 diabetes mellitus with hyperglycemia: Secondary | ICD-10-CM | POA: Diagnosis not present

## 2022-07-02 DIAGNOSIS — K760 Fatty (change of) liver, not elsewhere classified: Secondary | ICD-10-CM | POA: Diagnosis not present

## 2022-07-02 DIAGNOSIS — E876 Hypokalemia: Secondary | ICD-10-CM | POA: Diagnosis not present

## 2022-07-02 DIAGNOSIS — E1169 Type 2 diabetes mellitus with other specified complication: Secondary | ICD-10-CM | POA: Diagnosis not present

## 2022-07-02 DIAGNOSIS — R1310 Dysphagia, unspecified: Secondary | ICD-10-CM | POA: Diagnosis not present

## 2022-07-02 DIAGNOSIS — E669 Obesity, unspecified: Secondary | ICD-10-CM | POA: Diagnosis not present

## 2022-07-02 LAB — CBC WITH DIFFERENTIAL/PLATELET
Abs Immature Granulocytes: 0.13 10*3/uL — ABNORMAL HIGH (ref 0.00–0.07)
Basophils Absolute: 0.1 10*3/uL (ref 0.0–0.1)
Basophils Relative: 0 %
Eosinophils Absolute: 0.2 10*3/uL (ref 0.0–0.5)
Eosinophils Relative: 1 %
HCT: 34.8 % — ABNORMAL LOW (ref 36.0–46.0)
Hemoglobin: 11.6 g/dL — ABNORMAL LOW (ref 12.0–15.0)
Immature Granulocytes: 1 %
Lymphocytes Relative: 20 %
Lymphs Abs: 3.2 10*3/uL (ref 0.7–4.0)
MCH: 28.6 pg (ref 26.0–34.0)
MCHC: 33.3 g/dL (ref 30.0–36.0)
MCV: 85.7 fL (ref 80.0–100.0)
Monocytes Absolute: 1.5 10*3/uL — ABNORMAL HIGH (ref 0.1–1.0)
Monocytes Relative: 9 %
Neutro Abs: 11.1 10*3/uL — ABNORMAL HIGH (ref 1.7–7.7)
Neutrophils Relative %: 69 %
Platelets: 308 10*3/uL (ref 150–400)
RBC: 4.06 MIL/uL (ref 3.87–5.11)
RDW: 13.3 % (ref 11.5–15.5)
WBC: 16.1 10*3/uL — ABNORMAL HIGH (ref 4.0–10.5)
nRBC: 0 % (ref 0.0–0.2)

## 2022-07-02 LAB — RENAL FUNCTION PANEL
Albumin: 2.3 g/dL — ABNORMAL LOW (ref 3.5–5.0)
Anion gap: 10 (ref 5–15)
BUN: 9 mg/dL (ref 6–20)
CO2: 21 mmol/L — ABNORMAL LOW (ref 22–32)
Calcium: 8.4 mg/dL — ABNORMAL LOW (ref 8.9–10.3)
Chloride: 105 mmol/L (ref 98–111)
Creatinine, Ser: 0.5 mg/dL (ref 0.44–1.00)
GFR, Estimated: >60 mL/min (ref >60)
Glucose, Bld: 175 mg/dL — ABNORMAL HIGH (ref 70–99)
Phosphorus: 3.4 mg/dL (ref 2.5–4.6)
Potassium: 3.5 mmol/L (ref 3.5–5.1)
Sodium: 136 mmol/L (ref 135–145)

## 2022-07-02 LAB — URINE CULTURE: Culture: >=100000 — AB

## 2022-07-02 LAB — MAGNESIUM: Magnesium: 2.1 mg/dL (ref 1.7–2.4)

## 2022-07-02 LAB — GLUCOSE, CAPILLARY
Glucose-Capillary: 261 mg/dL — ABNORMAL HIGH (ref 70–99)
Glucose-Capillary: 169 mg/dL — ABNORMAL HIGH (ref 70–99)
Glucose-Capillary: 201 mg/dL — ABNORMAL HIGH (ref 70–99)
Glucose-Capillary: 369 mg/dL — ABNORMAL HIGH (ref 70–99)
Glucose-Capillary: 312 mg/dL — ABNORMAL HIGH (ref 70–99)
Glucose-Capillary: 334 mg/dL — ABNORMAL HIGH (ref 70–99)

## 2022-07-02 MED ORDER — MELATONIN 5 MG PO TABS
5.0000 mg | ORAL_TABLET | Freq: Every evening | ORAL | Status: DC | PRN
  Administered 2022-07-02: 5 mg via ORAL
  Filled 2022-07-02: qty 1

## 2022-07-02 MED ORDER — INSULIN DETEMIR 100 UNIT/ML ~~LOC~~ SOLN
18.0000 [IU] | Freq: Every day | SUBCUTANEOUS | Status: DC
  Administered 2022-07-02: 18 [IU] via SUBCUTANEOUS
  Filled 2022-07-02: qty 0.18

## 2022-07-02 MED ORDER — INSULIN STARTER KIT- PEN NEEDLES (ENGLISH)
1.0000 | Freq: Once | Status: AC
  Administered 2022-07-02: 1
  Filled 2022-07-02: qty 1

## 2022-07-02 MED ORDER — INSULIN ASPART PROT & ASPART (70-30 MIX) 100 UNIT/ML ~~LOC~~ SUSP
12.0000 [IU] | Freq: Two times a day (BID) | SUBCUTANEOUS | Status: DC
  Filled 2022-07-02: qty 10

## 2022-07-02 MED ORDER — LIVING WELL WITH DIABETES BOOK
Freq: Once | Status: AC
  Administered 2022-07-02: 1
  Filled 2022-07-02: qty 1

## 2022-07-02 MED ORDER — CEFADROXIL 500 MG PO CAPS
1000.0000 mg | ORAL_CAPSULE | Freq: Two times a day (BID) | ORAL | Status: DC
  Administered 2022-07-02 – 2022-07-03 (×3): 1000 mg via ORAL
  Filled 2022-07-02 (×3): qty 2

## 2022-07-02 NOTE — Progress Notes (Signed)
  Transition of Care (TOC) Screening Note   Patient Details  Name: Meegan Shanafelt Date of Birth: [birth year]   Transition of Care [facility]) CM/SW Contact:    Vassie Moselle, LCSW Phone Number: 07/02/2022, 10:09 AM    Transition of Care Department [facility]) has reviewed patient and no TOC needs have been identified at this time. We will continue to monitor patient advancement through interdisciplinary progression rounds. If new patient transition needs arise, please place a TOC consult.

## 2022-07-02 NOTE — Progress Notes (Signed)
PROGRESS NOTE    Samantha [name]  JKK:[id number] DOB: August 05, 1980 DOA: 06/29/2022 PCP: [name], NP    Chief Complaint  Patient presents with   Abdominal Pain    Brief Narrative:  Patient is a pleasant 42 year old female history of type 2 diabetes, hyperlipidemia, hypertension, ongoing tobacco use, fatty liver disease presented to the ED with chills, dysuria, right lower quadrant and right flank pain and right lower back pain with decreased oral intake.  Urinalysis done with WBC clumps, 11-20 WBCs, patient noted to have a leukocytosis with a white count of 17.7, influenza A and B-, COVID-19 PCR negative, RSV PCR negative.  Chest x-ray with no acute findings.  CT abdomen and pelvis concerning for focal right-sided pyelonephritis with tiny microcytic areas in the region of infection may represent small abscesses.  Patient pancultured and placed empirically on IV Rocephin.   Assessment & Plan:   Principal Problem:   Acute pyelonephritis Active Problems:   Essential hypertension   Type 2 diabetes mellitus with hyperglycemia (HCC)   Hyperlipidemia associated with type 2 diabetes mellitus (HCC)   Hypomagnesemia   Hypokalemia   Hypophosphatemia   Hypocalcemia   Protein-calorie malnutrition, mild (HCC)   Normocytic anemia   Hyponatremia   Odynophagia   Non-alcoholic fatty liver disease   Class 1 obesity   #1 acute pyelonephritis -Patient admitted with chills, leukocytosis, right flank, right lower back, right lower abdominal pain. -Urinalysis with glycosuria, trace hemoglobin, 15 ketones, nitrite negative, leukocytes negative, WBC clumps, 11-20 WBCs. -CT abdomen and pelvis concerning for focal right pyelonephritis with tiny microcytic areas in the region of infection may represent small abscesses. -Patient with a leukocytosis, currently afebrile. -Urine cultures preliminary with >100,000 colonies of Klebsiella pneumoniae which is sensitive to cephalosporins, fluoroquinolones,  gentamicin, imipenem, Bactrim, Unasyn, Zosyn.   -Transition from IV Rocephin to oral cefadroxil 1000 mg twice daily to complete a 14-day course of antibiotic treatment.   -Supportive care.    2.  Odynophagia -Continue PPI twice daily, Maalox with viscous lidocaine as needed. -Admitting physician discussed with GI who recommended trial of nystatin swish and swallow and if notes improvement will need GI consultation with outpatient follow-up.  3.  Nonalcoholic fatty liver disease -Patient noted to have made some progress with obesity. -Continue lifestyle modifications. -Statin. -Outpatient follow-up with PCP.  4.  Pseudohyponatremia -Patient noted to be on HCTZ prior to admission which will be discontinued on discharge. -DC HCTZ. -Hyponatremia improved.  5.  Hypomagnesemia/hypokalemia -In the setting of diuretic use. -Discontinued HCTZ. -Potassium currently at 3.5, magnesium at 2.1.   -Repeat labs in the AM.   6.  Hypophosphatemia -Repleted -Phosphorus at 3.4..  7.  Diabetes mellitus type 2 with hyperglycemia -Elevated CBGs likely secondary to acute infection. -CBG 169 this morning going up as high as 369 close to noon. -Hemoglobin A1c 9.7 (07/01/2022) -Increase Levemir to 18 units daily. -SSI. -Continue to hold home regimen of oral hypoglycemic agents. -Patient states lost her job in December and as such did not have any good insurance and home regimen of Trulicity now too expensive. -Patient being followed by diabetic coordinator, will change patient to Novolin 70/30 12 units twice daily starting tomorrow morning 07/03/2022. -Outpatient follow-up with PCP.  8.  Hyperlipidemia -Continue statin.  9.  Hypertension -Controlled on current regimen of Norvasc 10 mg daily, lisinopril 20 mg daily.   -HCTZ discontinued due to electrolyte abnormalities.  10.  Class I obesity -BMI 31.28 kg/m. -Lifestyle modification. -Outpatient follow-up with PCP.  11.  Normocytic  anemia -Patient with no overt bleeding. -Hemoglobin stable at 11.6.   DVT prophylaxis: Lovenox Code Status: Full Family Communication: Updated patient.  No family at bedside. Disposition: Home when clinically improved, cultures have resulted with sensitivities hopefully in the next 24 hours.  Status is: Inpatient Remains inpatient appropriate because: Severity of illness   Consultants:  None  Procedures:  CT abdomen and pelvis 06/29/2022   Antimicrobials:  IV Rocephin 06/30/2022>>>>> 07/02/2022 Cefadroxil 07/02/2022>>>>   Subjective: Patient laying in bed.  Denies any chest pain.  No shortness of breath.  No abdominal pain and flank pain improved.  Tolerating current diet.  Stated had been stressed this morning due to family issues and that she has been binge eating and that is why her blood glucose levels are elevated.  Feels diabetic medications need to be adjusted for better control.  Objective: Vitals:   07/01/22 1709 07/01/22 2024 07/02/22 0447 07/02/22 1018  BP: 123/84 105/73 122/86 132/88  Pulse: (!) 101 94 88 94  Resp: '18 18 18 17  '$ Temp: 98.7 F (37.1 C) 98.5 F (36.9 C) 98.3 F (36.8 C) 98.2 F (36.8 C)  TempSrc: Oral Oral Oral Oral  SpO2: 100% 98% 97% 98%  Weight:      Height:        Intake/Output Summary (Last 24 hours) at 07/02/2022 1245 Last data filed at 07/02/2022 0807 Gross per 24 hour  Intake 2555.57 ml  Output --  Net 2555.57 ml    Filed Weights   06/29/22 2036  Weight: 77.6 kg    Examination:  General exam: NAD. Respiratory system: CTAB.  No wheezes, no crackles, no rhonchi.  Fair air movement.  Speaking in full sentences.   Cardiovascular system: Regular rate rhythm no murmurs rubs or gallops.  No JVD.  No lower extremity edema.  Gastrointestinal system: Abdomen is soft, nontender, nondistended, positive bowel sounds.  No rebound.  No guarding.  No CVA tenderness. Central nervous system: Alert and oriented. No focal neurological  deficits. Extremities: Symmetric 5 x 5 power. Skin: No rashes, lesions or ulcers Psychiatry: Judgement and insight appear normal. Mood & affect appropriate.     Data Reviewed: I have personally reviewed following labs and imaging studies  CBC: Recent Labs  Lab 06/29/22 2046 06/29/22 2314 06/30/22 0532 07/01/22 0618 07/02/22 0538  WBC 17.7*  --  16.5* 17.1* 16.1*  NEUTROABS  --   --  13.3*  --  11.1*  HGB 13.7 13.6 11.6* 11.7* 11.6*  HCT 38.9 40.0 34.5* 34.7* 34.8*  MCV 81.4  --  85.6 85.7 85.7  PLT 346  --  275 284 308     Basic Metabolic Panel: Recent Labs  Lab 06/29/22 2046 06/29/22 2314 06/30/22 0532 07/01/22 0618 07/02/22 0538  NA 125* 129* 131* 134* 136  K 3.3* 3.1* 3.1* 3.5 3.5  CL 88*  --  97* 101 105  CO2 23  --  21* 23 21*  GLUCOSE 488*  --  335* 245* 175*  BUN 12  --  '8 8 9  '$ CREATININE 0.82  --  0.61 0.61 0.50  CALCIUM 9.0  --  7.9* 8.3* 8.4*  MG  --   --  1.5* 1.8 2.1  PHOS  --   --  2.2* 2.3* 3.4     GFR: Estimated Creatinine Clearance: 89.3 mL/min (by C-G formula based on SCr of 0.5 mg/dL).  Liver Function Tests: Recent Labs  Lab 06/29/22 2046 07/01/22 0618 07/02/22 0538  AST 31 26  --  ALT 22 16  --   ALKPHOS 80 49  --   BILITOT 0.8 0.4  --   PROT 8.0 6.2*  --   ALBUMIN 3.0* 2.2* 2.3*     CBG: Recent Labs  Lab 07/01/22 2144 07/02/22 0113 07/02/22 0444 07/02/22 0748 07/02/22 1151  GLUCAP 304* 261* 169* 201* 369*      Recent Results (from the past 240 hour(s))  Resp panel by RT-PCR (RSV, Flu A&B, Covid) Nasopharyngeal Swab     Status: None   Collection Time: 06/29/22  8:40 PM   Specimen: Nasopharyngeal Swab; Nasal Swab  Result Value Ref Range Status   SARS Coronavirus 2 by RT PCR NEGATIVE NEGATIVE Final    Comment: (NOTE) SARS-CoV-2 target nucleic acids are NOT DETECTED.  The SARS-CoV-2 RNA is generally detectable in upper respiratory specimens during the acute phase of infection. The lowest concentration of  SARS-CoV-2 viral copies this assay can detect is 138 copies/mL. A negative result does not preclude SARS-Cov-2 infection and should not be used as the sole basis for treatment or other patient management decisions. A negative result may occur with  improper specimen collection/handling, submission of specimen other than nasopharyngeal swab, presence of viral mutation(s) within the areas targeted by this assay, and inadequate number of viral copies(<138 copies/mL). A negative result must be combined with clinical observations, patient history, and epidemiological information. The expected result is Negative.  Fact Sheet for Patients:  EntrepreneurPulse.com.au  Fact Sheet for Healthcare Providers:  IncredibleEmployment.be  This test is no t yet approved or cleared by the [place] FDA and  has been authorized for detection and/or diagnosis of SARS-CoV-2 by FDA under an Emergency Use Authorization (EUA). This EUA will remain  in effect (meaning this test can be used) for the duration of the COVID-19 declaration under Section 564(b)(1) of the Act, 21 U.S.C.section 360bbb-3(b)(1), unless the authorization is terminated  or revoked sooner.       Influenza A by PCR NEGATIVE NEGATIVE Final   Influenza B by PCR NEGATIVE NEGATIVE Final    Comment: (NOTE) The Xpert Xpress SARS-CoV-2/FLU/RSV plus assay is intended as an aid in the diagnosis of influenza from Nasopharyngeal swab specimens and should not be used as a sole basis for treatment. Nasal washings and aspirates are unacceptable for Xpert Xpress SARS-CoV-2/FLU/RSV testing.  Fact Sheet for Patients: EntrepreneurPulse.com.au  Fact Sheet for Healthcare Providers: IncredibleEmployment.be  This test is not yet approved or cleared by the [place] FDA and has been authorized for detection and/or diagnosis of SARS-CoV-2 by FDA under an Emergency Use  Authorization (EUA). This EUA will remain in effect (meaning this test can be used) for the duration of the COVID-19 declaration under Section 564(b)(1) of the Act, 21 U.S.C. section 360bbb-3(b)(1), unless the authorization is terminated or revoked.     Resp Syncytial Virus by PCR NEGATIVE NEGATIVE Final    Comment: (NOTE) Fact Sheet for Patients: EntrepreneurPulse.com.au  Fact Sheet for Healthcare Providers: IncredibleEmployment.be  This test is not yet approved or cleared by the [place] FDA and has been authorized for detection and/or diagnosis of SARS-CoV-2 by FDA under an Emergency Use Authorization (EUA). This EUA will remain in effect (meaning this test can be used) for the duration of the COVID-19 declaration under Section 564(b)(1) of the Act, 21 U.S.C. section 360bbb-3(b)(1), unless the authorization is terminated or revoked.  Performed at [facility], [address] [place], [place] [address]   Urine Culture (for pregnant, neutropenic or urologic  patients or patients with an indwelling urinary catheter)     Status: Abnormal   Collection Time: 06/29/22  8:45 PM   Specimen: Urine, Clean Catch  Result Value Ref Range Status   Specimen Description   Final    URINE, CLEAN CATCH Performed at [facility], [address] [place], [place] [address]    Special Requests   Final    NONE Performed at [facility], [address] [place], [place] [address]    Culture >=100,000 COLONIES/mL KLEBSIELLA PNEUMONIAE (A)  Final   Report Status 07/02/2022 FINAL  Final   Organism ID, Bacteria KLEBSIELLA PNEUMONIAE (A)  Final      Susceptibility   Klebsiella pneumoniae - MIC*    AMPICILLIN RESISTANT Resistant     CEFAZOLIN <=4 SENSITIVE Sensitive     CEFEPIME <=0.12 SENSITIVE Sensitive     CEFTRIAXONE <=0.25 SENSITIVE Sensitive     CIPROFLOXACIN <=0.25 SENSITIVE Sensitive     GENTAMICIN <=1 SENSITIVE  Sensitive     IMIPENEM <=0.25 SENSITIVE Sensitive     NITROFURANTOIN 64 INTERMEDIATE Intermediate     TRIMETH/SULFA <=20 SENSITIVE Sensitive     AMPICILLIN/SULBACTAM 4 SENSITIVE Sensitive     PIP/TAZO <=4 SENSITIVE Sensitive     * >=100,000 COLONIES/mL KLEBSIELLA PNEUMONIAE  Blood culture (routine x 2)     Status: None (Preliminary result)   Collection Time: 06/30/22 12:06 AM   Specimen: BLOOD  Result Value Ref Range Status   Specimen Description   Final    BLOOD LEFT ANTECUBITAL Performed at [facility], [address] [place], [place] [address]    Special Requests   Final    BOTTLES DRAWN AEROBIC AND ANAEROBIC Blood Culture adequate volume Performed at [facility], [address] [place], [place] [address]    Culture   Final    NO GROWTH 2 DAYS Performed at [facility], [address], [place], [place] [address]    Report Status PENDING  Incomplete  Blood culture (routine x 2)     Status: None (Preliminary result)   Collection Time: 06/30/22 12:14 AM   Specimen: BLOOD LEFT FOREARM  Result Value Ref Range Status   Specimen Description   Final    BLOOD LEFT FOREARM Performed at [facility], [address] [place], [place] [address]    Special Requests   Final    BOTTLES DRAWN AEROBIC AND ANAEROBIC Blood Culture adequate volume Performed at [facility], [address] [place], [place] [address]    Culture   Final    NO GROWTH 2 DAYS Performed at [facility], [address] [place], [place] [address]    Report Status PENDING  Incomplete         Radiology Studies: No results found.      Scheduled Meds:  amLODipine  10 mg Oral Daily   cefadroxil  1,000 mg Oral BID   enoxaparin (LOVENOX) injection  40 mg Subcutaneous Q24H   insulin aspart  0-15 Units Subcutaneous Q4H   insulin detemir  18 Units Subcutaneous Daily   insulin starter kit- pen needles  1 kit Other Once   lisinopril  20 mg Oral Daily    living well with diabetes book   Does not apply Once   nystatin  5 mL Oral QID   pantoprazole  40 mg Oral BID   rosuvastatin  20 mg Oral  Daily   Continuous Infusions:  lactated ringers 125 mL/hr at 07/02/22 1027     LOS: 2 days    Time spent: 40 minutes    Irine Seal, MD [facility]   To contact the attending provider between 7A-7P or the covering provider during after hours 7P-7A, please log into the web site [email or web address] and access using universal [place] password for that web site. If you do not have the password, please call the hospital operator.  07/02/2022, 12:45 PM

## 2022-07-02 NOTE — Inpatient Diabetes Management (Signed)
Inpatient Diabetes Program Recommendations  AACE/ADA: New Consensus Statement on Inpatient Glycemic Control (2015)  Target Ranges:  Prepandial:   less than 140 mg/dL      Peak postprandial:   less than 180 mg/dL (1-2 hours)      Critically ill patients:  140 - 180 mg/dL   Lab Results  Component Value Date   GLUCAP 369 (H) 07/02/2022   HGBA1C 9.7 (H) 07/01/2022    Review of Glycemic Control  Diabetes history: DM2 Outpatient Diabetes medications: metformin 500 mg BID Current orders for Inpatient glycemic control: Levemir 18 QD, Novolog 0-15 Q4H  Inpatient Diabetes Program Recommendations:    Change Novolog to 0-15 TID with meals and 0-5 HS  For discharge:  Novolin ReliOn 70/30 12 units BID (#[id number]) Blood glucose meter kit (#[id number]) Insulin pen needles (#[id number]) Continue with metformin 500 BID  Spoke with pt at bedside regarding her diabetes control. States she has been stressed in the past few months and knows she needs to get back on track with controlling her blood sugars. Discussed A1C results (9.7%) and explained what an A1C is and informed patient that his current A1C indicates an average glucose of 232 mg/dl over the past 2-3 months. Discussed basic pathophysiology of DM Type 2, basic home care, importance of checking CBGs and maintaining good CBG control to prevent long-term and short-term complications. Reviewed glucose and A1C goals and explained that patient will need to continue to  Reviewed signs and symptoms of hyperglycemia and hypoglycemia along with treatment for both. Discussed impact of nutrition, exercise, stress, sickness, and medications on diabetes control. Reviewed Living Well with diabetes booklet and encouraged patient to read through entire book.  Educated patient on insulin pen use at home. Reviewed contents of insulin flexpen starter kit. Reviewed all steps if insulin pen including attachment of needle, 2-unit air shot, dialing up dose, giving injection,  removing needle, disposal of sharps, storage of unused insulin, disposal of insulin etc. Patient able to provide successful return demonstration. Also reviewed troubleshooting with insulin pen. MD to give patient Rxs for insulin pens and insulin pen needles.  Continue to follow.  Thank you. Lorenda Peck, RD, LDN, [place] Inpatient Diabetes Coordinator [phone]

## 2022-07-03 ENCOUNTER — Other Ambulatory Visit: Payer: Self-pay

## 2022-07-03 ENCOUNTER — Other Ambulatory Visit (HOSPITAL_COMMUNITY): Payer: Self-pay

## 2022-07-03 DIAGNOSIS — E1169 Type 2 diabetes mellitus with other specified complication: Secondary | ICD-10-CM | POA: Diagnosis not present

## 2022-07-03 DIAGNOSIS — E876 Hypokalemia: Secondary | ICD-10-CM | POA: Diagnosis not present

## 2022-07-03 DIAGNOSIS — D649 Anemia, unspecified: Secondary | ICD-10-CM | POA: Diagnosis not present

## 2022-07-03 DIAGNOSIS — N10 Acute tubulo-interstitial nephritis: Secondary | ICD-10-CM | POA: Diagnosis not present

## 2022-07-03 DIAGNOSIS — E871 Hypo-osmolality and hyponatremia: Secondary | ICD-10-CM | POA: Diagnosis not present

## 2022-07-03 DIAGNOSIS — R1310 Dysphagia, unspecified: Secondary | ICD-10-CM | POA: Diagnosis not present

## 2022-07-03 DIAGNOSIS — E1165 Type 2 diabetes mellitus with hyperglycemia: Secondary | ICD-10-CM | POA: Diagnosis not present

## 2022-07-03 DIAGNOSIS — E669 Obesity, unspecified: Secondary | ICD-10-CM | POA: Diagnosis not present

## 2022-07-03 DIAGNOSIS — K760 Fatty (change of) liver, not elsewhere classified: Secondary | ICD-10-CM | POA: Diagnosis not present

## 2022-07-03 DIAGNOSIS — I10 Essential (primary) hypertension: Secondary | ICD-10-CM | POA: Diagnosis not present

## 2022-07-03 LAB — CBC WITH DIFFERENTIAL/PLATELET
Abs Immature Granulocytes: 0.16 10*3/uL — ABNORMAL HIGH (ref 0.00–0.07)
Basophils Absolute: 0.1 10*3/uL (ref 0.0–0.1)
Basophils Relative: 0 %
Eosinophils Absolute: 0.2 10*3/uL (ref 0.0–0.5)
Eosinophils Relative: 1 %
HCT: 31.9 % — ABNORMAL LOW (ref 36.0–46.0)
Hemoglobin: 10.5 g/dL — ABNORMAL LOW (ref 12.0–15.0)
Immature Granulocytes: 1 %
Lymphocytes Relative: 20 %
Lymphs Abs: 3.2 10*3/uL (ref 0.7–4.0)
MCH: 28.4 pg (ref 26.0–34.0)
MCHC: 32.9 g/dL (ref 30.0–36.0)
MCV: 86.2 fL (ref 80.0–100.0)
Monocytes Absolute: 1.4 10*3/uL — ABNORMAL HIGH (ref 0.1–1.0)
Monocytes Relative: 9 %
Neutro Abs: 11 10*3/uL — ABNORMAL HIGH (ref 1.7–7.7)
Neutrophils Relative %: 69 %
Platelets: 322 10*3/uL (ref 150–400)
RBC: 3.7 MIL/uL — ABNORMAL LOW (ref 3.87–5.11)
RDW: 13.7 % (ref 11.5–15.5)
WBC: 16.1 10*3/uL — ABNORMAL HIGH (ref 4.0–10.5)
nRBC: 0.1 % (ref 0.0–0.2)

## 2022-07-03 LAB — BASIC METABOLIC PANEL
Anion gap: 8 (ref 5–15)
BUN: 8 mg/dL (ref 6–20)
CO2: 22 mmol/L (ref 22–32)
Calcium: 8.2 mg/dL — ABNORMAL LOW (ref 8.9–10.3)
Chloride: 106 mmol/L (ref 98–111)
Creatinine, Ser: 0.51 mg/dL (ref 0.44–1.00)
GFR, Estimated: >60 mL/min (ref >60)
Glucose, Bld: 189 mg/dL — ABNORMAL HIGH (ref 70–99)
Potassium: 3.8 mmol/L (ref 3.5–5.1)
Sodium: 136 mmol/L (ref 135–145)

## 2022-07-03 LAB — GLUCOSE, CAPILLARY
Glucose-Capillary: 230 mg/dL — ABNORMAL HIGH (ref 70–99)
Glucose-Capillary: 195 mg/dL — ABNORMAL HIGH (ref 70–99)
Glucose-Capillary: 297 mg/dL — ABNORMAL HIGH (ref 70–99)
Glucose-Capillary: 302 mg/dL — ABNORMAL HIGH (ref 70–99)

## 2022-07-03 MED ORDER — ACCU-CHEK SOFTCLIX LANCETS MISC
0 refills | Status: AC
  Filled 2022-07-03: qty 100, 25d supply, fill #0

## 2022-07-03 MED ORDER — CEFADROXIL 500 MG PO CAPS
1000.0000 mg | ORAL_CAPSULE | Freq: Two times a day (BID) | ORAL | 0 refills | Status: AC
  Filled 2022-07-03: qty 48, 12d supply, fill #0

## 2022-07-03 MED ORDER — GLUCOSE BLOOD VI STRP
ORAL_STRIP | 0 refills | Status: AC
  Filled 2022-07-03: qty 100, 25d supply, fill #0

## 2022-07-03 MED ORDER — NYSTATIN 100000 UNIT/ML MT SUSP
5.0000 mL | Freq: Four times a day (QID) | OROMUCOSAL | 0 refills | Status: AC
  Filled 2022-07-03: qty 60, 3d supply, fill #0

## 2022-07-03 MED ORDER — ACETAMINOPHEN 325 MG PO TABS: 650.0000 mg | ORAL_TABLET | Freq: Four times a day (QID) | ORAL | Status: DC | PRN

## 2022-07-03 MED ORDER — HUMULIN 70/30 KWIKPEN (70-30) 100 UNIT/ML ~~LOC~~ SUPN
14.0000 [IU] | PEN_INJECTOR | Freq: Two times a day (BID) | SUBCUTANEOUS | 1 refills | Status: DC
  Filled 2022-07-03: qty 9, 32d supply, fill #0
  Filled 2022-07-03 – 2022-08-25 (×3): qty 15, 54d supply, fill #0

## 2022-07-03 MED ORDER — ONDANSETRON 4 MG PO TBDP
ORAL_TABLET | ORAL | 0 refills | Status: DC
  Filled 2022-07-03: qty 12, 4d supply, fill #0

## 2022-07-03 MED ORDER — INSULIN ASPART PROT & ASPART (70-30 MIX) 100 UNIT/ML ~~LOC~~ SUSP
14.0000 [IU] | Freq: Two times a day (BID) | SUBCUTANEOUS | Status: DC
  Administered 2022-07-03: 14 [IU] via SUBCUTANEOUS
  Filled 2022-07-03: qty 10

## 2022-07-03 MED ORDER — BLOOD GLUCOSE METER KIT
PACK | 0 refills | Status: AC
  Filled 2022-07-03: qty 1, 30d supply, fill #0

## 2022-07-03 MED ORDER — LISINOPRIL 20 MG PO TABS
20.0000 mg | ORAL_TABLET | Freq: Every day | ORAL | 1 refills | Status: DC
  Filled 2022-07-03: qty 30, 30d supply, fill #0
  Filled 2022-08-07: qty 30, 30d supply, fill #1

## 2022-07-03 MED ORDER — PANTOPRAZOLE SODIUM 40 MG PO TBEC
40.0000 mg | DELAYED_RELEASE_TABLET | Freq: Every day | ORAL | 1 refills | Status: DC
  Filled 2022-07-03: qty 30, 30d supply, fill #0
  Filled 2022-08-07: qty 30, 30d supply, fill #1

## 2022-07-03 MED ORDER — "PEN NEEDLES 3/16"" 31G X 5 MM MISC"
14.0000 [IU] | Freq: Two times a day (BID) | 0 refills | Status: DC
  Filled 2022-07-03: qty 100, 34d supply, fill #0

## 2022-07-03 MED ORDER — NICOTINE 21 MG/24HR TD PT24
21.0000 mg | MEDICATED_PATCH | Freq: Every day | TRANSDERMAL | 0 refills | Status: DC | PRN
  Filled 2022-07-03: qty 28, 28d supply, fill #0

## 2022-07-03 NOTE — TOC Transition Note (Signed)
Transition of Care [facility]) - CM/SW Discharge Note   Patient Details  Name: Samantha [name] MRN: [id number] Date of Birth: [birth year]  Transition of Care [facility]) CM/SW Contact:  Vassie Moselle, LCSW Phone Number: 07/03/2022, 10:59 AM   Clinical Narrative:    Met with pt to discuss resources and transportation needs. Pt shares that she has 3 children from ages of 42y.o. to 99y.o. who live with her. She shares that she and her ex-husband both have custody of the children however, he has not been active in their lives and will typically text them occasionally. She shares that yesterday he picked up all of her children from school and did not communicate with anyone of where the children were and was not answering pt's calls. Pt's children did not show up to school today. While in the room pt's oldest son called her and told her that they were all at their fathers house. They confirmed that none of them were going to school today and shared that they were safe. CSW provided pt with resources for advocacy, custody, and family crisis concerns. Pt shares that her car is currently located at [place]. She reports she may have someone who can pick her up however, without having a set discharge time it is difficult to arrange and confirm transportation. If pt unable to secure ride at discharge [facility] will provide taxi voucher for pt to return to her car at [place].   Final next level of care: Home/Self Care Barriers to Discharge: No Barriers Identified   Patient Goals and CMS Choice CMS Medicare.gov Compare Post Acute Care list provided to:: Patient Choice offered to / list presented to : Patient  Discharge Placement                         Discharge Plan and Services Additional resources added to the After Visit Summary for                  DME Arranged: N/A DME Agency: NA                  Social Determinants of Health (SDOH) Interventions SDOH Screenings   Food  Insecurity: No Food Insecurity (06/30/2022)  Housing: Low Risk  (06/30/2022)  Transportation Needs: No Transportation Needs (06/30/2022)  Utilities: Not At Risk (06/30/2022)  Depression (PHQ2-9): Low Risk  (01/30/2022)  Tobacco Use: High Risk (06/30/2022)     Readmission Risk Interventions    07/02/2022   10:08 AM  Readmission Risk Prevention Plan  Transportation Screening Complete  PCP or Specialist Appt within 5-7 Days Complete  Home Care Screening Complete  Medication Review (RN CM) Complete

## 2022-07-03 NOTE — Inpatient Diabetes Management (Signed)
Inpatient Diabetes Program Recommendations  AACE/ADA: New Consensus Statement on Inpatient Glycemic Control (2015)  Target Ranges:  Prepandial:   less than 140 mg/dL      Peak postprandial:   less than 180 mg/dL (1-2 hours)      Critically ill patients:  140 - 180 mg/dL   Lab Results  Component Value Date   GLUCAP 302 (H) 07/03/2022   HGBA1C 9.7 (H) 07/01/2022    Review of Glycemic Control  Made f/u phone call to pt that was discharged earlier today, to make certain she was able to pick up her insulin at [place]. She did pick it up and son took her back to vehicle at [place]. Pt received video of insulin pen administration. Instructed to start the 70/30 before dinner meal tonight. Pt states she didn't have any questions and appreciative of the phone call.   Thank you. Lorenda Peck, RD, LDN, [place] Inpatient Diabetes Coordinator [phone]

## 2022-07-03 NOTE — Discharge Summary (Signed)
Physician Discharge Summary  Samantha [name] MVE:[id number] DOB: 10-Aug-1980 DOA: 06/29/2022  PCP: Samantha Pounds, NP  Admit date: 06/29/2022 Discharge date: 07/03/2022  Time spent: 60 minutes  Recommendations for Outpatient Follow-up:  Follow-up with Samantha Pounds, NP in 2 weeks.  On follow-up patient will need a basic metabolic profile, magnesium level, phosphorus level done to follow-up on electrolytes, renal function.  Patient will need a CBC done to follow-up on H&H.  Patient's diabetes will need to be reassessed as patient started on insulin during this hospitalization.  Patient's acute pyelonephritis will need to be followed up upon.  Patient's blood pressure also need to be reassessed as patient's HCTZ was discontinued.  Patient will need repeat CT abdomen and pelvis done in a few weeks to follow-up on acute pyelonephritis and tiny microcytic areas noted on CT scan.  Patient may benefit from outpatient diabetes education.  Patient's odynophagia will need to be followed up upon to see if improvement on PPI.    Discharge Diagnoses:  Principal Problem:   Acute pyelonephritis Active Problems:   Essential hypertension   Type 2 diabetes mellitus with hyperglycemia (HCC)   Hyperlipidemia associated with type 2 diabetes mellitus (HCC)   Hypomagnesemia   Hypokalemia   Hypophosphatemia   Hypocalcemia   Protein-calorie malnutrition, mild (HCC)   Normocytic anemia   Hyponatremia   Odynophagia   Non-alcoholic fatty liver disease   Class 1 obesity   Discharge Condition: Stable and improved.  Diet recommendation: Carb modified diet  Filed Weights   06/29/22 2036  Weight: 77.6 kg    History of present illness:  HPI per Dr. Darrick Meigs [name] is a 42 y.o. female with medical history significant of type 2 diabetes, hyperlipidemia, hypertension, active tobacco use, fatty liver disease who presented to the emergency department with history of feeling ill since Thursday evening  associated with chills, dysuria, right lower quadrant, right flank and right lower back pain, decreased appetite only unable to have fluids.  She has been having odynophagia when swallowing for the past few days.  She denied fever, night sweats, rhinorrhea, sore throat, wheezing or hemoptysis.  No typical chest pain, palpitations, diaphoresis, PND, orthopnea or pitting edema of the lower extremities.  No emesis, diarrhea, constipation, melena or hematochezia. No polyuria, polydipsia, polyphagia or blurred vision.    Lab work: Urinalysis with glucosuria more than 500 and ketonuria 50 mg deciliter with trace hemoglobinuria, many bacteria on microscopic examination along with 11-20 WBC and WBC clumps.  CBC showed a white count of 17.7, hemoglobin 13.7 g/dL platelets 346.  CMP with a sodium 125 (corrected to glucose level 134), potassium 3.3, chloride 88 and CO2 23 mmol/L.  Glucose 488 mg/dL and albumin 3.0 g/dL.  Calcium, renal function and the rest of the hepatic functions were normal.  Normal lipase.  Negative pregnancy urine test.  Negative coronavirus, influenza A/B and RSV PCR.   Imaging: Portable 1 view chest radiograph with no evidence of acute cardiopulmonary disease.  CT abdomen/pelvis with contrast with findings compatible with focal right-sided pyelonephritis.  Tiny microcytic areas in the region of infection may represent small abscesses.  Recommend follow-up to confirm complete resolution and to exclude underlying lesion.  There is also hepatic steatosis.   ED course: Initial vital signs were temperature 98.3 F, pulse 127, respiration 18, BP 133/90 mmHg O2 sat 99% on room air.  Hospital Course:  #1 acute pyelonephritis -Patient admitted with chills, leukocytosis, right flank, right lower back, right lower abdominal pain. -Urinalysis with  glycosuria, trace hemoglobin, 15 ketones, nitrite negative, leukocytes negative, WBC clumps, 11-20 WBCs. -CT abdomen and pelvis concerning for focal right  pyelonephritis with tiny microcytic areas in the region of infection may represent small abscesses. -Patient with a leukocytosis, patient remained afebrile. -Urine cultures with >100,000 colonies of Klebsiella pneumoniae which is sensitive to cephalosporins, fluoroquinolones, gentamicin, imipenem, Bactrim, Unasyn, Zosyn.   -Patient maintained on IV Rocephin during the hospitalization and subsequently transition to transitioned to cefadroxil 1000 mg twice daily to complete a 14-day course of antibiotic treatment.  -Outpatient follow-up with PCP. -Will need repeat CT scans to follow-up on tiny microcytic areas noted on CT/pyelonephritis.   2.  Odynophagia -Patient was placed on PPI twice daily, Maalox with viscous lidocaine as needed. -Admitting physician discussed with GI who recommended trial of nystatin swish and swallow and if no improvement will need GI consultation with outpatient follow-up. -Patient improved clinically during the hospitalization but discharged on PPI daily. -Outpatient follow-up with PCP.   3.  Nonalcoholic fatty liver disease -Patient noted to have made some progress with obesity. - lifestyle modifications. -Statin. -Outpatient follow-up with PCP.   4.  Pseudohyponatremia -Patient noted to be on HCTZ prior to admission which was discontinued on discharge. -Hyponatremia improved.   5.  Hypomagnesemia/hypokalemia -In the setting of diuretic use. -Discontinued HCTZ. -Potassium and magnesium repleted.   -HCTZ discontinued on discharge.   -Outpatient follow-up with PCP.     6.  Hypophosphatemia -Repleted   7.  Diabetes mellitus type 2 with hyperglycemia -Elevated CBGs likely secondary to acute infection during the hospitalization.. -Hemoglobin A1c 9.7 (07/01/2022) -Patient maintained on long-acting Levemir and dose adjusted for better blood glucose control.  Patient also maintained on SSI.   -Patient's oral hypoglycemic agents held during the hospitalization.    -Patient stated lost her job in December and as such did not have any good insurance and home regimen of Trulicity now too expensive. -Patient being followed by diabetic coordinator, patient subsequently changed to Novolin 70/30 14 units twice daily by day of discharge.   -Patient was discharged home on Novolin 70/30 40 units twice daily, metformin 500 mg twice daily as previously taking with close outpatient follow-up with PCP.   -Patient was discharged in stable and improved condition.    8.  Hyperlipidemia -Patient maintained on statin.   9.  Hypertension -HCTZ held during the hospitalization blood pressure controlled on Norvasc 10 mg daily, lisinopril 20 mg daily.   -HCTZ with discontinued on discharge.   -Outpatient follow-up with PCP.    10.  Class I obesity -BMI 31.28 kg/m. -Lifestyle modification. -Outpatient follow-up with PCP.   11.  Normocytic anemia -Patient with no overt bleeding. -Hemoglobin remained stable at 10.5 by day of discharge.        Procedures: CT abdomen and pelvis 06/29/2022.  Consultations: None  Discharge Exam: Vitals:   07/02/22 1402 07/03/22 0308  BP: 117/77 117/79  Pulse: (!) 107 88  Resp: 18 18  Temp: 98.1 F (36.7 C) 98.7 F (37.1 C)  SpO2: 98% 97%    General: NAD Cardiovascular: RRR no murmurs rubs or gallops.  No JVD.  No lower extremity edema. Respiratory: CTAB.  No wheezes, no crackles, no rhonchi.  Fair air movement.  Speaking in full sentences.  Discharge Instructions   Discharge Instructions     Diet Carb Modified   Complete by: As directed    Increase activity slowly   Complete by: As directed       Allergies as  of 07/03/2022   No Known Allergies      Medication List     STOP taking these medications    Kombiglyze XR 09-998 MG Tb24 Generic drug: Saxagliptin-Metformin   lisinopril-hydrochlorothiazide 20-25 MG tablet Commonly known as: ZESTORETIC   Trulicity 5.36 IW/8.0HO Sopn Generic drug: Dulaglutide        TAKE these medications    Accu-Chek Softclix Lancets lancets Use up to four times daily as directed   acetaminophen 325 MG tablet Commonly known as: TYLENOL Take 2 tablets (650 mg total) by mouth every 6 (six) hours as needed for mild pain, fever or headache (temp>100.8).   albuterol 108 (90 Base) MCG/ACT inhaler Commonly known as: VENTOLIN HFA Inhale 2 puffs into the lungs every 6 (six) hours as needed for wheezing or shortness of breath.   amLODipine 10 MG tablet Commonly known as: NORVASC Take 1 tablet (10 mg total) by mouth daily.   blood glucose meter kit and supplies Use up to four times daily as directed What changed: additional instructions   Blood Pressure Monitor Devi Please provide patient with insurance approved blood pressure monitor   busPIRone 5 MG tablet Commonly known as: BUSPAR Take 2 tablets (10 mg total) by mouth 3 (three) times daily as needed. What changed: reasons to take this   cefadroxil 500 MG capsule Commonly known as: DURICEF Take 2 capsules (1,000 mg total) by mouth 2 (two) times daily for 12 days.   etonogestrel-ethinyl estradiol 0.12-0.015 MG/24HR vaginal ring Commonly known as: NUVARING Insert vaginally and leave in place for 3 consecutive weeks, then remove for 1 week.   glucose blood test strip Use up to four times daily as directed   HumuLIN 70/30 KwikPen (70-30) 100 UNIT/ML KwikPen Generic drug: insulin isophane & regular human KwikPen Inject 14 Units into the skin 2 (two) times daily.   lisinopril 20 MG tablet Commonly known as: ZESTRIL Take 1 tablet (20 mg total) by mouth daily.   metFORMIN 500 MG 24 hr tablet Commonly known as: GLUCOPHAGE-XR Take 1 tablet (500 mg total) by mouth 2 (two) times daily with a meal.   Misc. Devices Misc Please provide patient with insurance approved blood pressure monitor   nicotine 21 mg/24hr patch Commonly known as: NICODERM CQ - dosed in mg/24 hours Place 1 patch (21 mg total) onto  the skin daily as needed (Nicotine withdrawal symptoms.).   nystatin 100000 UNIT/ML suspension Commonly known as: MYCOSTATIN Take 5 mLs (500,000 Units total) by mouth 4 (four) times daily for 5 days.   ondansetron 4 MG disintegrating tablet Commonly known as: ZOFRAN-ODT Dissolve 1 tablet by mouth every 8 hour for up to 7 days as needed for nausea.   pantoprazole 40 MG tablet Commonly known as: PROTONIX Take 1 tablet (40 mg total) by mouth daily.   PARoxetine 20 MG tablet Commonly known as: Paxil Take 1 tablet (20 mg total) by mouth daily.   Pen Needles 3/16" 31G X 5 MM Misc Use 2 times a day as directed   rosuvastatin 20 MG tablet Commonly known as: CRESTOR Take 1 tablet (20 mg total) by mouth daily.        No Known Allergies  Follow-up Information     Samantha Pounds, NP. Schedule an appointment as soon as possible for a visit in 2 week(s).   Specialty: Nurse Practitioner Contact information: [address] [place] [address] [phone]  The results of significant diagnostics from this hospitalization (including imaging, microbiology, ancillary and laboratory) are listed below for reference.    Significant Diagnostic Studies: CT ABDOMEN PELVIS W CONTRAST  Result Date: 06/29/2022 CLINICAL DATA:  Right lower quadrant abdominal pain. Burning with urination. EXAM: CT ABDOMEN AND PELVIS WITH CONTRAST TECHNIQUE: Multidetector CT imaging of the abdomen and pelvis was performed using the standard protocol following bolus administration of intravenous contrast. RADIATION DOSE REDUCTION: This exam was performed according to the departmental dose-optimization program which includes automated exposure control, adjustment of the mA and/or kV according to patient size and/or use of iterative reconstruction technique. CONTRAST:  17m OMNIPAQUE IOHEXOL 300 MG/ML  SOLN COMPARISON:  None Available. FINDINGS: Lower chest: No acute abnormality.  Hepatobiliary: There is diffuse fatty infiltration of the liver. Gallbladder and bile ducts are within normal limits. Pancreas: Unremarkable. No pancreatic ductal dilatation or surrounding inflammatory changes. Spleen: Normal in size without focal abnormality. Adrenals/Urinary Tract: There is an ill-defined patchy mixed hypodense and hyperdense area in the anterior right kidney measuring proximally 2.7 by 2.3 by 3.0 cm. There is surrounding inflammatory stranding. Microcystic areas may be present internally best seen on coronal image 5/44. There is no hydronephrosis. There is mild enhancement of the proximal right ureter. The adrenal glands, left kidney and bladder are within normal limits. Stomach/Bowel: Stomach is within normal limits. Appendix appears normal. No evidence of bowel wall thickening, distention, or inflammatory changes. Vascular/Lymphatic: No significant vascular findings are present. No enlarged abdominal or pelvic lymph nodes. Reproductive: Uterus and bilateral adnexa are unremarkable. Other: No abdominal wall hernia or abnormality. No abdominopelvic ascites. Musculoskeletal: There are degenerative changes at L5-S1. IMPRESSION: 1. Findings compatible with focal right-sided pyelonephritis. Tiny microcystic areas in the region of infection may represent small abscesses. Recommend follow-up to confirm complete resolution and to exclude underlying lesion. 2. Fatty infiltration of the liver. These results were called by telephone at the time of interpretation on 06/29/2022 at 11:39 pm [facility], who verbally acknowledged these results. Electronically Signed   By: ARonney AstersM.D.   On: 06/29/2022 23:39   DG Chest Portable 1 View  Result Date: 06/29/2022 CLINICAL DATA:  Cough, shortness of breath EXAM: PORTABLE CHEST 1 VIEW COMPARISON:  04/01/2022 FINDINGS: Lungs are clear.  No pleural effusion or pneumothorax. The heart is normal in size. IMPRESSION: No evidence of acute  cardiopulmonary disease. Electronically Signed   By: SJulian HyM.D.   On: 06/29/2022 22:49    Microbiology: Recent Results (from the past 240 hour(s))  Resp panel by RT-PCR (RSV, Flu A&B, Covid) Nasopharyngeal Swab     Status: None   Collection Time: 06/29/22  8:40 PM   Specimen: Nasopharyngeal Swab; Nasal Swab  Result Value Ref Range Status   SARS Coronavirus 2 by RT PCR NEGATIVE NEGATIVE Final    Comment: (NOTE) SARS-CoV-2 target nucleic acids are NOT DETECTED.  The SARS-CoV-2 RNA is generally detectable in upper respiratory specimens during the acute phase of infection. The lowest concentration of SARS-CoV-2 viral copies this assay can detect is 138 copies/mL. A negative result does not preclude SARS-Cov-2 infection and should not be used as the sole basis for treatment or other patient management decisions. A negative result may occur with  improper specimen collection/handling, submission of specimen other than nasopharyngeal swab, presence of viral mutation(s) within the areas targeted by this assay, and inadequate number of viral copies(<138 copies/mL). A negative result must be combined with clinical observations, patient history, and epidemiological  information. The expected result is Negative.  Fact Sheet for Patients:  EntrepreneurPulse.com.au  Fact Sheet for Healthcare Providers:  IncredibleEmployment.be  This test is no t yet approved or cleared by the [place] FDA and  has been authorized for detection and/or diagnosis of SARS-CoV-2 by FDA under an Emergency Use Authorization (EUA). This EUA will remain  in effect (meaning this test can be used) for the duration of the COVID-19 declaration under Section 564(b)(1) of the Act, 21 U.S.C.section 360bbb-3(b)(1), unless the authorization is terminated  or revoked sooner.       Influenza A by PCR NEGATIVE NEGATIVE Final   Influenza B by PCR NEGATIVE NEGATIVE Final     Comment: (NOTE) The Xpert Xpress SARS-CoV-2/FLU/RSV plus assay is intended as an aid in the diagnosis of influenza from Nasopharyngeal swab specimens and should not be used as a sole basis for treatment. Nasal washings and aspirates are unacceptable for Xpert Xpress SARS-CoV-2/FLU/RSV testing.  Fact Sheet for Patients: EntrepreneurPulse.com.au  Fact Sheet for Healthcare Providers: IncredibleEmployment.be  This test is not yet approved or cleared by the [place] FDA and has been authorized for detection and/or diagnosis of SARS-CoV-2 by FDA under an Emergency Use Authorization (EUA). This EUA will remain in effect (meaning this test can be used) for the duration of the COVID-19 declaration under Section 564(b)(1) of the Act, 21 U.S.C. section 360bbb-3(b)(1), unless the authorization is terminated or revoked.     Resp Syncytial Virus by PCR NEGATIVE NEGATIVE Final    Comment: (NOTE) Fact Sheet for Patients: EntrepreneurPulse.com.au  Fact Sheet for Healthcare Providers: IncredibleEmployment.be  This test is not yet approved or cleared by the [place] FDA and has been authorized for detection and/or diagnosis of SARS-CoV-2 by FDA under an Emergency Use Authorization (EUA). This EUA will remain in effect (meaning this test can be used) for the duration of the COVID-19 declaration under Section 564(b)(1) of the Act, 21 U.S.C. section 360bbb-3(b)(1), unless the authorization is terminated or revoked.  Performed at [facility], [address] [place], [place] [address]   Urine Culture (for pregnant, neutropenic or urologic patients or patients with an indwelling urinary catheter)     Status: Abnormal   Collection Time: 06/29/22  8:45 PM   Specimen: Urine, Clean Catch  Result Value Ref Range Status   Specimen Description   Final    URINE, CLEAN CATCH Performed at [facility],  [address] [place], [place] [address]    Special Requests   Final    NONE Performed at [facility], [address] [place], [place] [address]    Culture >=100,000 COLONIES/mL KLEBSIELLA PNEUMONIAE (A)  Final   Report Status 07/02/2022 FINAL  Final   Organism ID, Bacteria KLEBSIELLA PNEUMONIAE (A)  Final      Susceptibility   Klebsiella pneumoniae - MIC*    AMPICILLIN RESISTANT Resistant     CEFAZOLIN <=4 SENSITIVE Sensitive     CEFEPIME <=0.12 SENSITIVE Sensitive     CEFTRIAXONE <=0.25 SENSITIVE Sensitive     CIPROFLOXACIN <=0.25 SENSITIVE Sensitive     GENTAMICIN <=1 SENSITIVE Sensitive     IMIPENEM <=0.25 SENSITIVE Sensitive     NITROFURANTOIN 64 INTERMEDIATE Intermediate     TRIMETH/SULFA <=20 SENSITIVE Sensitive     AMPICILLIN/SULBACTAM 4 SENSITIVE Sensitive     PIP/TAZO <=4 SENSITIVE Sensitive     * >=100,000 COLONIES/mL KLEBSIELLA PNEUMONIAE  Blood culture (routine x 2)     Status:  None (Preliminary result)   Collection Time: 06/30/22 12:06 AM   Specimen: BLOOD  Result Value Ref Range Status   Specimen Description   Final    BLOOD LEFT ANTECUBITAL Performed at [facility], [address] [place], [place] [address]    Special Requests   Final    BOTTLES DRAWN AEROBIC AND ANAEROBIC Blood Culture adequate volume Performed at [facility], [address] [place], [place] [address]    Culture   Final    NO GROWTH 2 DAYS Performed at [facility], [place] [address] [place], [place] [address]    Report Status PENDING  Incomplete  Blood culture (routine x 2)     Status: None (Preliminary result)   Collection Time: 06/30/22 12:14 AM   Specimen: BLOOD LEFT FOREARM  Result Value Ref Range Status   Specimen Description   Final    BLOOD LEFT FOREARM Performed at [facility], [address] [place], [place] [address]    Special Requests   Final    BOTTLES DRAWN AEROBIC AND ANAEROBIC Blood Culture adequate  volume Performed at [facility], [address] [place], [place] [address]    Culture   Final    NO GROWTH 2 DAYS Performed at [facility], [place] [address], [place], [place] [address]    Report Status PENDING  Incomplete     Labs: Basic Metabolic Panel: Recent Labs  Lab 06/29/22 2046 06/29/22 2314 06/30/22 0532 07/01/22 0618 07/02/22 0538 07/03/22 0445  NA 125* 129* 131* 134* 136 136  K 3.3* 3.1* 3.1* 3.5 3.5 3.8  CL 88*  --  97* 101 105 106  CO2 23  --  21* 23 21* 22  GLUCOSE 488*  --  335* 245* 175* 189*  BUN 12  --  '8 8 9 8  '$ CREATININE 0.82  --  0.61 0.61 0.50 0.51  CALCIUM 9.0  --  7.9* 8.3* 8.4* 8.2*  MG  --   --  1.5* 1.8 2.1  --   PHOS  --   --  2.2* 2.3* 3.4  --    Liver Function Tests: Recent Labs  Lab 06/29/22 2046 07/01/22 0618 07/02/22 0538  AST 31 26  --   ALT 22 16  --   ALKPHOS 80 49  --   BILITOT 0.8 0.4  --   PROT 8.0 6.2*  --   ALBUMIN 3.0* 2.2* 2.3*   Recent Labs  Lab 06/29/22 2046  LIPASE 31   No results for input(s): "AMMONIA" in the last 168 hours. CBC: Recent Labs  Lab 06/29/22 2046 06/29/22 2314 06/30/22 0532 07/01/22 0618 07/02/22 0538 07/03/22 0445  WBC 17.7*  --  16.5* 17.1* 16.1* 16.1*  NEUTROABS  --   --  13.3*  --  11.1* 11.0*  HGB 13.7 13.6 11.6* 11.7* 11.6* 10.5*  HCT 38.9 40.0 34.5* 34.7* 34.8* 31.9*  MCV 81.4  --  85.6 85.7 85.7 86.2  PLT 346  --  275 284 308 322   Cardiac Enzymes: No results for input(s): "CKTOTAL", "CKMB", "CKMBINDEX", "TROPONINI" in the last 168 hours. BNP: BNP (last 3 results) No results for input(s): "BNP" in the last 8760 hours.  ProBNP (last 3 results) No results for input(s): "PROBNP" in the last 8760 hours.  CBG: Recent Labs  Lab 07/02/22 1829 07/02/22 1950 07/03/22 0020 07/03/22 0446 07/03/22 0814  GLUCAP 312* 334* 230* 195* 297*  Signed:  Irine Seal MD.  [facility] 07/03/2022, 10:14 AM

## 2022-07-04 LAB — BLOOD CULTURE ID PANEL (REFLEXED) - BCID2

## 2022-07-05 LAB — CULTURE, BLOOD (ROUTINE X 2)
Culture: NO GROWTH
Special Requests: ADEQUATE

## 2022-07-06 ENCOUNTER — Telehealth: Payer: Self-pay

## 2022-07-06 LAB — CULTURE, BLOOD (ROUTINE X 2): Special Requests: ADEQUATE

## 2022-07-06 NOTE — Telephone Encounter (Signed)
Transition Care Management Unsuccessful Follow-up Telephone Call  Date of discharge and from where:  07/03/2022, [facility]   Attempts:  1st Attempt  Reason for unsuccessful TCM follow-up call:  Left voice message - [phone], call back requested   Need to schedule a hospital follow up appointment.

## 2022-07-07 ENCOUNTER — Telehealth: Payer: Self-pay

## 2022-07-07 NOTE — Telephone Encounter (Signed)
Transition Care Management Unsuccessful Follow-up Telephone Call  Date of discharge and from where:  07/03/2022, [facility]   Attempts:  2nd Attempt  Reason for unsuccessful TCM follow-up call:  Left voice message - [phone], call back requested.   The patient has scheduled a hospital follow up appointment at [facility] [place] [facility] with Lazaro Arms, NP - 07/10/2022.

## 2022-07-08 ENCOUNTER — Telehealth: Payer: Self-pay

## 2022-07-08 NOTE — Telephone Encounter (Signed)
Transition Care Management Unsuccessful Follow-up Telephone Call  Date of discharge and from where:  07/03/2022, [facility]    Attempts:  3rd Attempt  Reason for unsuccessful TCM follow-up call:  Left voice message - [phone], call back requested.     The patient has scheduled a hospital follow up appointment at [facility] with Lazaro Arms, NP - 07/10/2022.

## 2022-07-10 ENCOUNTER — Inpatient Hospital Stay: Payer: Self-pay | Admitting: Nurse Practitioner

## 2022-07-20 ENCOUNTER — Other Ambulatory Visit (HOSPITAL_COMMUNITY): Payer: Self-pay

## 2022-07-28 ENCOUNTER — Other Ambulatory Visit: Payer: Self-pay

## 2022-08-07 ENCOUNTER — Other Ambulatory Visit (HOSPITAL_COMMUNITY): Payer: Self-pay

## 2022-08-07 ENCOUNTER — Other Ambulatory Visit: Payer: Self-pay

## 2022-08-11 ENCOUNTER — Ambulatory Visit: Payer: Medicaid Other | Admitting: Physician Assistant

## 2022-08-11 ENCOUNTER — Other Ambulatory Visit: Payer: Self-pay

## 2022-08-11 ENCOUNTER — Inpatient Hospital Stay: Payer: 59 | Admitting: Internal Medicine

## 2022-08-11 ENCOUNTER — Encounter: Payer: Self-pay | Admitting: Physician Assistant

## 2022-08-11 VITALS — BP 131/87 | HR 106 | Ht 62.0 in | Wt 182.0 lb

## 2022-08-11 DIAGNOSIS — E1165 Type 2 diabetes mellitus with hyperglycemia: Secondary | ICD-10-CM | POA: Diagnosis not present

## 2022-08-11 DIAGNOSIS — D649 Anemia, unspecified: Secondary | ICD-10-CM

## 2022-08-11 DIAGNOSIS — R300 Dysuria: Secondary | ICD-10-CM | POA: Diagnosis not present

## 2022-08-11 DIAGNOSIS — I10 Essential (primary) hypertension: Secondary | ICD-10-CM | POA: Diagnosis not present

## 2022-08-11 DIAGNOSIS — B3731 Acute candidiasis of vulva and vagina: Secondary | ICD-10-CM

## 2022-08-11 DIAGNOSIS — R935 Abnormal findings on diagnostic imaging of other abdominal regions, including retroperitoneum: Secondary | ICD-10-CM

## 2022-08-11 DIAGNOSIS — N3000 Acute cystitis without hematuria: Secondary | ICD-10-CM | POA: Diagnosis not present

## 2022-08-11 DIAGNOSIS — E871 Hypo-osmolality and hyponatremia: Secondary | ICD-10-CM | POA: Diagnosis not present

## 2022-08-11 DIAGNOSIS — N2889 Other specified disorders of kidney and ureter: Secondary | ICD-10-CM

## 2022-08-11 DIAGNOSIS — R1310 Dysphagia, unspecified: Secondary | ICD-10-CM

## 2022-08-11 DIAGNOSIS — Z7985 Long-term (current) use of injectable non-insulin antidiabetic drugs: Secondary | ICD-10-CM

## 2022-08-11 DIAGNOSIS — E876 Hypokalemia: Secondary | ICD-10-CM | POA: Diagnosis not present

## 2022-08-11 DIAGNOSIS — Z7984 Long term (current) use of oral hypoglycemic drugs: Secondary | ICD-10-CM

## 2022-08-11 LAB — POCT URINALYSIS DIP (CLINITEK)
Bilirubin, UA: NEGATIVE
Glucose, UA: 500 mg/dL — AB
Nitrite, UA: POSITIVE — AB
POC PROTEIN,UA: NEGATIVE
Spec Grav, UA: 1.02 (ref 1.010–1.025)
Urobilinogen, UA: 0.2 E.U./dL
pH, UA: 5.5 (ref 5.0–8.0)

## 2022-08-11 MED ORDER — JANUMET 50-1000 MG PO TABS
1.0000 | ORAL_TABLET | Freq: Two times a day (BID) | ORAL | 1 refills | Status: DC
  Filled 2022-08-11: qty 60, 30d supply, fill #0
  Filled 2022-09-20: qty 60, 30d supply, fill #1

## 2022-08-11 MED ORDER — FLUCONAZOLE 150 MG PO TABS
150.0000 mg | ORAL_TABLET | Freq: Once | ORAL | 0 refills | Status: AC
  Filled 2022-08-11: qty 2, 4d supply, fill #0

## 2022-08-11 MED ORDER — SULFAMETHOXAZOLE-TRIMETHOPRIM 800-160 MG PO TABS
1.0000 | ORAL_TABLET | Freq: Two times a day (BID) | ORAL | 0 refills | Status: AC
  Filled 2022-08-11: qty 10, 5d supply, fill #0

## 2022-08-11 NOTE — Progress Notes (Signed)
Established Patient Office Visit  Subjective   Patient ID: Samantha [name], female    DOB: 1981-02-12  Age: 42 y.o. MRN: [id number]  Chief Complaint  Patient presents with   Dysuria    Was on antibiotics for 2+ weeks, c/o dysuria and vaginal itching      Admit date: 06/29/2022 Discharge date: 07/03/2022   Time spent: 60 minutes   Recommendations for Outpatient Follow-up:  1. Follow-up with Samantha Pounds, NP in 2 weeks.  On follow-up patient will need a basic metabolic profile, magnesium level, phosphorus level done to follow-up on electrolytes, renal function.  Patient will need a CBC done to follow-up on H&H.  Patient's diabetes will need to be reassessed as patient started on insulin during this hospitalization.  Patient's acute pyelonephritis will need to be followed up upon.  Patient's blood pressure also need to be reassessed as patient's HCTZ was discontinued.  Patient will need repeat CT abdomen and pelvis done in a few weeks to follow-up on acute pyelonephritis and tiny microcytic areas noted on CT scan.  Patient may benefit from outpatient diabetes education.  Patient's odynophagia will need to be followed up upon to see if improvement on PPI.       Discharge Diagnoses:  Principal Problem:   Acute pyelonephritis Active Problems:   Essential hypertension   Type 2 diabetes mellitus with hyperglycemia (HCC)   Hyperlipidemia associated with type 2 diabetes mellitus (HCC)   Hypomagnesemia   Hypokalemia   Hypophosphatemia   Hypocalcemia   Protein-calorie malnutrition, mild (HCC)   Normocytic anemia   Hyponatremia   Odynophagia   Non-alcoholic fatty liver disease   Class 1 obesity     Discharge Condition: Stable and improved.   Diet recommendation: Carb modified diet   Filed Weights   06/29/22 2036 Weight: 77.6 kg     History of present illness:  HPI per [name] is a 42 y.o. female with medical history significant of type 2 diabetes,  hyperlipidemia, hypertension, active tobacco use, fatty liver disease who presented to the emergency department with history of feeling ill since Thursday evening associated with chills, dysuria, right lower quadrant, right flank and right lower back pain, decreased appetite only unable to have fluids.  She has been having odynophagia when swallowing for the past few days.  She denied fever, night sweats, rhinorrhea, sore throat, wheezing or hemoptysis.  No typical chest pain, palpitations, diaphoresis, PND, orthopnea or pitting edema of the lower extremities.  No emesis, diarrhea, constipation, melena or hematochezia. No polyuria, polydipsia, polyphagia or blurred vision.    Lab work: Urinalysis with glucosuria more than 500 and ketonuria 50 mg deciliter with trace hemoglobinuria, many bacteria on microscopic examination along with 11-20 WBC and WBC clumps.  CBC showed a white count of 17.7, hemoglobin 13.7 g/dL platelets 346.  CMP with a sodium 125 (corrected to glucose level 134), potassium 3.3, chloride 88 and CO2 23 mmol/L.  Glucose 488 mg/dL and albumin 3.0 g/dL.  Calcium, renal function and the rest of the hepatic functions were normal.  Normal lipase.  Negative pregnancy urine test.  Negative coronavirus, influenza A/B and RSV PCR.   Imaging: Portable 1 view chest radiograph with no evidence of acute cardiopulmonary disease.  CT abdomen/pelvis with contrast with findings compatible with focal right-sided pyelonephritis.  Tiny microcytic areas in the region of infection may represent small abscesses.  Recommend follow-up to confirm complete resolution and to exclude underlying lesion.  There is also hepatic steatosis.  ED course: Initial vital signs were temperature 98.3 F, pulse 127, respiration 18, BP 133/90 mmHg O2 sat 99% on room air.   Hospital Course:  #1 acute pyelonephritis -Patient admitted with chills, leukocytosis, right flank, right lower back, right lower abdominal pain. -Urinalysis  with glycosuria, trace hemoglobin, 15 ketones, nitrite negative, leukocytes negative, WBC clumps, 11-20 WBCs. -CT abdomen and pelvis concerning for focal right pyelonephritis with tiny microcytic areas in the region of infection may represent small abscesses. -Patient with a leukocytosis, patient remained afebrile. -Urine cultures with >100,000 colonies of Klebsiella pneumoniae which is sensitive to cephalosporins, fluoroquinolones, gentamicin, imipenem, Bactrim, Unasyn, Zosyn.   -Patient maintained on IV Rocephin during the hospitalization and subsequently transition to transitioned to cefadroxil 1000 mg twice daily to complete a 14-day course of antibiotic treatment.  -Outpatient follow-up with PCP. -Will need repeat CT scans to follow-up on tiny microcytic areas noted on CT/pyelonephritis.   2.  Odynophagia -Patient was placed on PPI twice daily, Maalox with viscous lidocaine as needed. -Admitting physician discussed with GI who recommended trial of nystatin swish and swallow and if no improvement will need GI consultation with outpatient follow-up. -Patient improved clinically during the hospitalization but discharged on PPI daily. -Outpatient follow-up with PCP.   3.  Nonalcoholic fatty liver disease -Patient noted to have made some progress with obesity. - lifestyle modifications. -Statin. -Outpatient follow-up with PCP.   4.  Pseudohyponatremia -Patient noted to be on HCTZ prior to admission which was discontinued on discharge. -Hyponatremia improved.   5.  Hypomagnesemia/hypokalemia -In the setting of diuretic use. -Discontinued HCTZ. -Potassium and magnesium repleted.   -HCTZ discontinued on discharge.   -Outpatient follow-up with PCP.     6.  Hypophosphatemia -Repleted   7.  Diabetes mellitus type 2 with hyperglycemia -Elevated CBGs likely secondary to acute infection during the hospitalization.. -Hemoglobin A1c 9.7 (07/01/2022) -Patient maintained on long-acting Levemir  and dose adjusted for better blood glucose control.  Patient also maintained on SSI.   -Patient's oral hypoglycemic agents held during the hospitalization.   -Patient stated lost her job in December and as such did not have any good insurance and home regimen of Trulicity now too expensive. -Patient being followed by diabetic coordinator, patient subsequently changed to Novolin 70/30 14 units twice daily by day of discharge.   -Patient was discharged home on Novolin 70/30 40 units twice daily, metformin 500 mg twice daily as previously taking with close outpatient follow-up with PCP.   -Patient was discharged in stable and improved condition.    8.  Hyperlipidemia -Patient maintained on statin.   9.  Hypertension -HCTZ held during the hospitalization blood pressure controlled on Norvasc 10 mg daily, lisinopril 20 mg daily.   -HCTZ with discontinued on discharge.   -Outpatient follow-up with PCP.    10.  Class I obesity -BMI 31.28 kg/m. -Lifestyle modification. -Outpatient follow-up with PCP.   11.  Normocytic anemia -Patient with no overt bleeding. -Hemoglobin remained stable at 10.5 by day of discharge.         States today that she has been continuing to have burning after urination.  States that she has been having some white discharge as well as vaginal itching.  States that this has been ongoing since her hospitalization, states that she did complete the course of oral antibiotics that she was discharged on.  States that she has been taking her metformin and Humulin as prescribed during her hospitalization.  States that she will check her blood glucose  levels at home but is not consistent with it.  States her blood glucose level this morning was 187.  States that she would like to restart Janumet, states that she felt her blood glucose levels were much better controlled when she was taking that medication.  States that she does not check her blood pressure at home.  States  that she is no longer having difficulty swallowing, states that she did finish her last pantoprazole this morning, states that refill is available for her but she is unsure if she should continue this.  States that she has started going to CBT twice weekly, states that she did not feel the BuSpar or Paxil was making any difference and does not want to resume it at this time.         01/30/2022    1:50 PM 01/27/2022    9:38 AM 01/03/2021   10:17 AM 10/25/2019    8:59 AM 08/03/2019    2:55 PM  Depression screen PHQ 2/9  Decreased Interest 0 0 1 0 0  Down, Depressed, Hopeless 0 0 1 0 0  PHQ - 2 Score 0 0 2 0 0  Altered sleeping 0 0 '2 1 2  '$ Tired, decreased energy 0 0 '1 1 2  '$ Change in appetite 0 0 2 0 0  Feeling bad or failure about yourself  0 0 2 0 0  Trouble concentrating 0 0 2 0 0  Moving slowly or fidgety/restless 0 0 0 0 0  Suicidal thoughts 0 0 0 0 0  PHQ-9 Score 0 0 '11 2 4      '$ 01/30/2022    1:50 PM 01/27/2022    9:39 AM 01/03/2021   10:18 AM 11/20/2019    4:13 PM  GAD 7 : Generalized Anxiety Score  Nervous, Anxious, on Edge 0 0 1 0  Control/stop worrying 0 0 2 0  Worry too much - different things 0 0 2 0  Trouble relaxing 0 0 2 2  Restless 0 0 2 0  Easily annoyed or irritable 0 0 2 2  Afraid - awful might happen 0 0 0 0  Total GAD 7 Score 0 0 11 4     Past Medical History:  Diagnosis Date   Diabetes mellitus without complication (HCC)    Hyperlipidemia    Hypertension    Social History   Socioeconomic History   Marital status: Single    Spouse name: Not on file   Number of children: Not on file   Years of education: Not on file   Highest education level: Not on file  Occupational History   Not on file  Tobacco Use   Smoking status: Every Day    Packs/day: 0.50    Years: 0.40    Total pack years: 0.20    Types: Cigarettes   Smokeless tobacco: Never  Vaping Use   Vaping Use: Never used  Substance and Sexual Activity   Alcohol use: No   Drug use: No   Sexual  activity: Yes    Birth control/protection: None  Other Topics Concern   Not on file  Social History Narrative   Not on file   Social Determinants of Health   Financial Resource Strain: Not on file  Food Insecurity: No Food Insecurity (06/30/2022)   Hunger Vital Sign    Worried About Running Out of Food in the Last Year: Never true    Ran Out of Food in the Last Year: Never true  Transportation  Needs: No Transportation Needs (06/30/2022)   PRAPARE - Hydrologist (Medical): No    Lack of Transportation (Non-Medical): No  Physical Activity: Not on file  Stress: Not on file  Social Connections: Not on file  Intimate Partner Violence: Not At Risk (06/30/2022)   Humiliation, Afraid, Rape, and Kick questionnaire    Fear of Current or Ex-Partner: No    Emotionally Abused: No    Physically Abused: No    Sexually Abused: No   Family History  Problem Relation Age of Onset   Heart disease Mother    Lung cancer Father    Diabetes type II Other    No Known Allergies  Review of Systems  Constitutional:  Negative for chills and fever.  HENT: Negative.    Eyes: Negative.   Respiratory:  Negative for shortness of breath.   Cardiovascular:  Negative for chest pain.  Gastrointestinal:  Negative for abdominal pain, nausea and vomiting.  Genitourinary:  Positive for dysuria. Negative for frequency and urgency.  Musculoskeletal:  Negative for back pain.  Skin: Negative.   Neurological: Negative.   Endo/Heme/Allergies: Negative.   Psychiatric/Behavioral:  Negative for depression. The patient is not nervous/anxious and does not have insomnia.       Objective:     BP 131/87 (BP Location: Left Arm, Patient Position: Sitting, Cuff Size: Large)   Pulse (!) 106   Ht '5\' 2"'$  (1.575 m)   Wt 182 lb (82.6 kg)   SpO2 98%   BMI 33.29 kg/m    Physical Exam Vitals and nursing note reviewed.  Constitutional:      Appearance: Normal appearance.  HENT:     Head:  Normocephalic and atraumatic.     Right Ear: External ear normal.     Left Ear: External ear normal.     Nose: Nose normal.     Mouth/Throat:     Mouth: Mucous membranes are moist.     Pharynx: Oropharynx is clear.  Eyes:     Extraocular Movements: Extraocular movements intact.     Conjunctiva/sclera: Conjunctivae normal.     Pupils: Pupils are equal, round, and reactive to light.  Cardiovascular:     Rate and Rhythm: Normal rate and regular rhythm.     Pulses: Normal pulses.     Heart sounds: Normal heart sounds.  Pulmonary:     Effort: Pulmonary effort is normal.     Breath sounds: Normal breath sounds.  Abdominal:     Tenderness: There is no abdominal tenderness. There is no right CVA tenderness or left CVA tenderness.  Musculoskeletal:        General: Normal range of motion.     Cervical back: Normal range of motion and neck supple.  Skin:    General: Skin is warm and dry.  Neurological:     General: No focal deficit present.     Mental Status: She is alert and oriented to person, place, and time.  Psychiatric:        Mood and Affect: Mood normal.        Behavior: Behavior normal.        Thought Content: Thought content normal.        Judgment: Judgment normal.        Assessment & Plan:   Problem List Items Addressed This Visit       Cardiovascular and Mediastinum   Essential hypertension     Endocrine   Type 2 diabetes mellitus with hyperglycemia (  [place])   Relevant Medications   sitaGLIPtin-metformin (JANUMET) 50-1000 MG tablet     Other   Hypomagnesemia   Relevant Orders   Magnesium   Hypokalemia   Hypophosphatemia   Relevant Orders   Phosphorus   Hypocalcemia   Normocytic anemia   Relevant Orders   CBC with Differential/Platelet   Hyponatremia   Relevant Orders   Basic metabolic panel   Odynophagia   Other Visit Diagnoses     Acute cystitis without hematuria    -  Primary   Relevant Medications   sulfamethoxazole-trimethoprim (BACTRIM DS)  800-160 MG tablet   Other Relevant Orders   POCT URINALYSIS DIP (CLINITEK) (Completed)   Urine Culture   Yeast infection involving the vagina and surrounding area       Relevant Medications   fluconazole (DIFLUCAN) 150 MG tablet   sulfamethoxazole-trimethoprim (BACTRIM DS) 800-160 MG tablet   Abnormal CT of the abdomen       Other specified disorders of kidney and ureter       Relevant Orders   CT ABDOMEN W WO CONTRAST     1. Acute cystitis without hematuria UA positive for nitrates and leuks.  Patient given trial of Bactrim based on previous urine culture results.  Patient encouraged to continue drinking lots of water, supportive care information given.  Red flags given for prompt reevaluation.  [facility] and would like to continue with that however would like to be able to discuss her type 2 diabetes and potential medication changes. - POCT URINALYSIS DIP (CLINITEK) - Urine Culture - sulfamethoxazole-trimethoprim (BACTRIM DS) 800-160 MG tablet; Take 1 tablet by mouth 2 (two) times daily for 5 days.  Dispense: 10 tablet; Refill: 0  2. Yeast infection involving the vagina and surrounding area Trial Diflucan patient education given on supportive care - fluconazole (DIFLUCAN) 150 MG tablet; Take 1 tablet (150 mg total) by mouth once for 1 dose. May repeat in 3 days if no relief  Dispense: 2 tablet; Refill: 0  3. Type 2 diabetes mellitus with hyperglycemia, without long-term current use of insulin (HCC) Resume janument, stop metformin.  Patient encouraged to check blood glucose levels at home, given information regarding Ozempic and freestyle libre.  Patient would like to have further discussions about this in the future. - sitaGLIPtin-metformin (JANUMET) 50-1000 MG tablet; Take 1 tablet by mouth 2 (two) times daily with a meal.  Dispense: 60 tablet; Refill: 1  4. Abnormal CT of the abdomen IMPRESSION: 1. Findings  compatible with focal right-sided pyelonephritis. Tiny microcystic areas in the region of infection may represent small abscesses. Recommend follow-up to confirm complete resolution and to exclude underlying lesion. 2. Fatty infiltration of the liver.   These results were called by telephone at the time of interpretation on 06/29/2022 at 11:39 pm to provider [facility] , who verbally acknowledged these results.     Electronically Signed   By: Ronney Asters M.D.   On: 06/29/2022 23:39  5. Other specified disorders of kidney and ureter  - CT ABDOMEN W WO CONTRAST; Future  6. Odynophagia Patient has refill available of Protonix.  Encouraged patient to continue a trial of Protonix if she feels symptoms have returned.  7. Essential hypertension Continue current regimen  8. Hypocalcemia   9. Hyponatremia  - Basic metabolic panel  10. Hypokalemia   11. Hypophosphatemia  - Phosphorus  12. Hypomagnesemia  - Magnesium  13. Normocytic anemia  -  CBC with Differential/Platelet    I have reviewed the patient's medical history (PMH, PSH, Social History, Family History, Medications, and allergies) , and have been updated if relevant. I spent 30 minutes reviewing chart and  face to face time with patient.   Return if symptoms worsen or fail to improve.    [place] Grip Mayers, PA-C

## 2022-08-11 NOTE — Patient Instructions (Addendum)
You are going to discontinue the metformin and restart Janumet.  It is okay to not continue the pantoprazole at this time, however if your symptoms of difficulty swallowing resume, I encourage you to resume that medication.  To help with your vaginal itching, you are going to take Diflucan once, you can repeat it in 3 days if it does not resolve the issue.  To help with your urinary tract infection, you are going to take Bactrim twice daily for 5 days.  We will call you with the results of your urine culture.  The continuous blood sugar monitor is called freestyle libre.  The once weekly injectable is called Ozempic.  We will call you with today's lab results.  I encourage you to keep your appointment with Levada Dy in 2 weeks for further discussion.  Kennieth Rad, PA-C Physician Assistant [facility] [email or web address]   Vaginal Yeast Infection, Adult  Vaginal yeast infection is a condition that causes vaginal discharge as well as soreness, swelling, and redness (inflammation) of the vagina. This is a common condition. Some women get this infection frequently. What are the causes? This condition is caused by a change in the normal balance of the yeast (Candida) and normal bacteria that live in the vagina. This change causes an overgrowth of yeast, which causes the inflammation. What increases the risk? The condition is more likely to develop in women who: Take antibiotic medicines. Have diabetes. Take birth control pills. Are pregnant. Douche often. Have a weak body defense system (immune system). Have been taking steroid medicines for a long time. Frequently wear tight clothing. What are the signs or symptoms? Symptoms of this condition include: White, thick, creamy vaginal discharge. Swelling, itching, redness, and irritation of the vagina. The lips of the vagina (labia) may be affected as well. Pain or a burning feeling  while urinating. Pain during sex. How is this diagnosed? This condition is diagnosed based on: Your medical history. A physical exam. A pelvic exam. Your health care provider will examine a sample of your vaginal discharge under a microscope. Your health care provider may send this sample for testing to confirm the diagnosis. How is this treated? This condition is treated with medicine. Medicines may be over-the-counter or prescription. You may be told to use one or more of the following: Medicine that is taken by mouth (orally). Medicine that is applied as a cream (topically). Medicine that is inserted directly into the vagina (suppository). Follow these instructions at home: Take or apply over-the-counter and prescription medicines only as told by your health care provider. Do not use tampons until your health care provider approves. Do not have sex until your infection has cleared. Sex can prolong or worsen your symptoms of infection. Ask your health care provider when it is safe to resume sexual activity. Keep all follow-up visits. This is important. How is this prevented?  Do not wear tight clothes, such as pantyhose or tight pants. Wear breathable cotton underwear. Do not use douches, perfumed soap, creams, or powders. Wipe from front to back after using the toilet. If you have diabetes, keep your blood sugar levels under control. Ask your health care provider for other ways to prevent yeast infections. Contact a health care provider if: You have a fever. Your symptoms go away and then return. Your symptoms do not get better with treatment. Your symptoms get worse. You have new symptoms. You develop blisters in or around your vagina. You have blood coming from your vagina and  it is not your menstrual period. You develop pain in your abdomen. Summary Vaginal yeast infection is a condition that causes discharge as well as soreness, swelling, and redness (inflammation) of the  vagina. This condition is treated with medicine. Medicines may be over-the-counter or prescription. Take or apply over-the-counter and prescription medicines only as told by your health care provider. Do not douche. Resume sexual activity or use of tampons as instructed by your health care provider. Contact a health care provider if your symptoms do not get better with treatment or your symptoms go away and then return. This information is not intended to replace advice given to you by your health care provider. Make sure you discuss any questions you have with your health care provider. Document Revised: 08/05/2020 Document Reviewed: 08/05/2020 Elsevier Patient Education  Samantha [name].

## 2022-08-12 ENCOUNTER — Other Ambulatory Visit (HOSPITAL_BASED_OUTPATIENT_CLINIC_OR_DEPARTMENT_OTHER): Payer: Medicaid Other

## 2022-08-12 LAB — CBC WITH DIFFERENTIAL/PLATELET
Basophils Absolute: 0.1 10*3/uL (ref 0.0–0.2)
Basos: 1 %
EOS (ABSOLUTE): 0.1 10*3/uL (ref 0.0–0.4)
Eos: 1 %
Hematocrit: 42.4 % (ref 34.0–46.6)
Hemoglobin: 13.9 g/dL (ref 11.1–15.9)
Immature Grans (Abs): 0 10*3/uL (ref 0.0–0.1)
Immature Granulocytes: 0 %
Lymphocytes Absolute: 3.3 10*3/uL — ABNORMAL HIGH (ref 0.7–3.1)
Lymphs: 20 %
MCH: 28.6 pg (ref 26.6–33.0)
MCHC: 32.8 g/dL (ref 31.5–35.7)
MCV: 87 fL (ref 79–97)
Monocytes Absolute: 0.7 10*3/uL (ref 0.1–0.9)
Monocytes: 4 %
Neutrophils Absolute: 12.2 10*3/uL — ABNORMAL HIGH (ref 1.4–7.0)
Neutrophils: 74 %
Platelets: 487 10*3/uL — ABNORMAL HIGH (ref 150–450)
RBC: 4.86 x10E6/uL (ref 3.77–5.28)
RDW: 14.2 % (ref 11.7–15.4)
WBC: 16.5 10*3/uL — ABNORMAL HIGH (ref 3.4–10.8)

## 2022-08-12 LAB — MAGNESIUM: Magnesium: 1.8 mg/dL (ref 1.6–2.3)

## 2022-08-12 LAB — BASIC METABOLIC PANEL
BUN/Creatinine Ratio: 16 (ref 9–23)
BUN: 9 mg/dL (ref 6–24)
CO2: 18 mmol/L — ABNORMAL LOW (ref 20–29)
Calcium: 9.3 mg/dL (ref 8.7–10.2)
Chloride: 101 mmol/L (ref 96–106)
Creatinine, Ser: 0.56 mg/dL — ABNORMAL LOW (ref 0.57–1.00)
Glucose: 167 mg/dL — ABNORMAL HIGH (ref 70–99)
Potassium: 4 mmol/L (ref 3.5–5.2)
Sodium: 138 mmol/L (ref 134–144)
eGFR: 118 mL/min/{1.73_m2} (ref >59)

## 2022-08-12 LAB — PHOSPHORUS: Phosphorus: 3.2 mg/dL (ref 3.0–4.3)

## 2022-08-13 ENCOUNTER — Telehealth: Payer: Self-pay | Admitting: Nurse Practitioner

## 2022-08-13 LAB — URINE CULTURE

## 2022-08-13 NOTE — Telephone Encounter (Signed)
Dawn calling from [place] is scheduled to have CT Abdomin ordered by Carrolyn Meiers, PA is need pre authorization by her insurance. CB- [phone] X [id number]

## 2022-08-14 ENCOUNTER — Telehealth (HOSPITAL_BASED_OUTPATIENT_CLINIC_OR_DEPARTMENT_OTHER): Payer: Self-pay

## 2022-08-14 NOTE — Telephone Encounter (Signed)
PA was submitted today, 08/14/2022 at 1014. Pending status as of 1455.

## 2022-08-15 ENCOUNTER — Ambulatory Visit (HOSPITAL_BASED_OUTPATIENT_CLINIC_OR_DEPARTMENT_OTHER): Payer: Medicaid Other

## 2022-08-17 ENCOUNTER — Ambulatory Visit: Payer: Medicaid Other | Admitting: Nurse Practitioner

## 2022-08-20 ENCOUNTER — Telehealth: Payer: Self-pay

## 2022-08-20 NOTE — Telephone Encounter (Signed)
Patient is scheduled for CT- Abdomen W WO Contrast on 03.23.2024. A Prior Authorization was originally submitted by Team Lead [place] on 03.18.2024.  Spoke with Marcelino Duster., [employer] representative, for claim number [id number], this claim has been cancelled ([facility]) due to patient having a Optometrist. She states the claim will have to submitted through her primary insurance due to Medicaid serving as her secondary insurance.   Informed [place], Pre-Service Specialist for further assistance.

## 2022-08-21 ENCOUNTER — Telehealth (HOSPITAL_BASED_OUTPATIENT_CLINIC_OR_DEPARTMENT_OTHER): Payer: Self-pay

## 2022-08-22 ENCOUNTER — Ambulatory Visit (HOSPITAL_BASED_OUTPATIENT_CLINIC_OR_DEPARTMENT_OTHER): Payer: Medicaid Other

## 2022-08-25 ENCOUNTER — Other Ambulatory Visit: Payer: Self-pay

## 2022-08-26 NOTE — Telephone Encounter (Signed)
Patient is scheduled for CT- Abdomen W WO Contrast on 03.23.2024. A Prior Authorization was originally submitted by Team Lead Travia on 03.18.2024.   Spoke with Marcelino Duster., [employer] representative, for claim number [id number], this claim has been cancelled ([facility]) due to patient having a Optometrist. She states the claim will have to submitted through her primary insurance due to Medicaid serving as her secondary insurance.    Informed [place], Pre-Service Specialist for further assistance.    As of 3.27.24, this issue has not been resolved by patient, and claim has been cancelled by [facility].  Patient will have to contact [facility] for cancellation documentation, and submit to [employer] to move forward with Clearance fir CT imaging.

## 2022-08-27 ENCOUNTER — Inpatient Hospital Stay: Payer: 59 | Admitting: Physician Assistant

## 2022-08-27 ENCOUNTER — Other Ambulatory Visit: Payer: Self-pay | Admitting: Nurse Practitioner

## 2022-08-27 ENCOUNTER — Other Ambulatory Visit: Payer: Self-pay

## 2022-08-27 DIAGNOSIS — E1165 Type 2 diabetes mellitus with hyperglycemia: Secondary | ICD-10-CM

## 2022-08-27 MED ORDER — "PEN NEEDLES 3/16"" 31G X 5 MM MISC"
14.0000 [IU] | Freq: Two times a day (BID) | 0 refills | Status: DC
  Filled 2022-08-27 – 2022-09-20 (×2): qty 100, 34d supply, fill #0

## 2022-09-02 ENCOUNTER — Other Ambulatory Visit: Payer: Self-pay

## 2022-09-20 ENCOUNTER — Other Ambulatory Visit: Payer: Self-pay | Admitting: Nurse Practitioner

## 2022-09-20 DIAGNOSIS — F17200 Nicotine dependence, unspecified, uncomplicated: Secondary | ICD-10-CM

## 2022-09-21 ENCOUNTER — Other Ambulatory Visit: Payer: Self-pay

## 2022-09-21 ENCOUNTER — Other Ambulatory Visit (HOSPITAL_COMMUNITY): Payer: Self-pay

## 2022-09-21 NOTE — Telephone Encounter (Signed)
Requested medication (s) are due for refill today - expired Rx  Requested medication (s) are on the active medication list -yes  Future visit scheduled -no  Last refill: 08/13/21 8.5g 2RF  Notes to clinic: expired Rx  Requested Prescriptions  Pending Prescriptions Disp Refills   albuterol (VENTOLIN HFA) 108 (90 Base) MCG/ACT inhaler 8.5 g 2    Sig: Inhale 2 puffs into the lungs every 6 (six) hours as needed for wheezing or shortness of breath.     Pulmonology:  Beta Agonists 2 Passed - 09/20/2022  2:12 PM      Passed - Last BP in normal range    BP Readings from Last 1 Encounters:  08/11/22 131/87         Passed - Last Heart Rate in normal range    Pulse Readings from Last 1 Encounters:  08/11/22 (!) 106         Passed - Valid encounter within last 12 months    Recent Outpatient Visits           7 months ago Anxiety and depression   [place] [facility] [place], [place] W, NP   7 months ago Type 2 diabetes mellitus with hyperglycemia, without long-term current use of insulin [facility])   [place] [facility] [place], Shea Stakes, NP   7 months ago Vaginal itching   [place] [facility] Storm Frisk, MD   1 year ago Essential hypertension   [place] [facility] [place], [place] W, NP   1 year ago Essential hypertension   [place] [facility] Georganna Skeans, MD                 Requested Prescriptions  Pending Prescriptions Disp Refills   albuterol (VENTOLIN HFA) 108 (90 Base) MCG/ACT inhaler 8.5 g 2    Sig: Inhale 2 puffs into the lungs every 6 (six) hours as needed for wheezing or shortness of breath.     Pulmonology:  Beta Agonists 2 Passed - 09/20/2022  2:12 PM      Passed - Last BP in normal range    BP Readings from Last 1 Encounters:  08/11/22 131/87         Passed - Last Heart Rate in normal range    Pulse Readings from Last 1  Encounters:  08/11/22 (!) 106         Passed - Valid encounter within last 12 months    Recent Outpatient Visits           7 months ago Anxiety and depression   [place] [facility] [place], [place] W, NP   7 months ago Type 2 diabetes mellitus with hyperglycemia, without long-term current use of insulin [facility])   [place] [facility] [place], Shea Stakes, NP   7 months ago Vaginal itching   [facility] Storm Frisk, MD   1 year ago Essential hypertension   [place] [facility] [place], Shea Stakes, NP   1 year ago Essential hypertension   [place] [facility] Georganna Skeans, MD

## 2022-09-22 ENCOUNTER — Other Ambulatory Visit: Payer: Self-pay

## 2022-09-27 ENCOUNTER — Other Ambulatory Visit (HOSPITAL_COMMUNITY): Payer: Self-pay

## 2022-09-27 ENCOUNTER — Other Ambulatory Visit: Payer: Self-pay | Admitting: Critical Care Medicine

## 2022-09-27 ENCOUNTER — Other Ambulatory Visit: Payer: Self-pay | Admitting: Nurse Practitioner

## 2022-09-27 DIAGNOSIS — I10 Essential (primary) hypertension: Secondary | ICD-10-CM

## 2022-09-27 DIAGNOSIS — F17200 Nicotine dependence, unspecified, uncomplicated: Secondary | ICD-10-CM

## 2022-09-28 ENCOUNTER — Other Ambulatory Visit (HOSPITAL_COMMUNITY): Payer: Self-pay

## 2022-10-01 ENCOUNTER — Other Ambulatory Visit: Payer: Self-pay | Admitting: Nurse Practitioner

## 2022-10-01 ENCOUNTER — Other Ambulatory Visit: Payer: Self-pay

## 2022-10-01 ENCOUNTER — Other Ambulatory Visit: Payer: Self-pay | Admitting: Critical Care Medicine

## 2022-10-01 DIAGNOSIS — F17200 Nicotine dependence, unspecified, uncomplicated: Secondary | ICD-10-CM

## 2022-10-01 DIAGNOSIS — I10 Essential (primary) hypertension: Secondary | ICD-10-CM

## 2022-10-01 MED ORDER — ROSUVASTATIN CALCIUM 20 MG PO TABS
20.0000 mg | ORAL_TABLET | Freq: Every day | ORAL | 0 refills | Status: DC
  Filled 2022-10-01: qty 30, 30d supply, fill #0

## 2022-10-01 MED ORDER — AMLODIPINE BESYLATE 10 MG PO TABS
10.0000 mg | ORAL_TABLET | Freq: Every day | ORAL | 0 refills | Status: DC
  Filled 2022-10-01: qty 30, 30d supply, fill #0

## 2022-10-01 MED ORDER — ALBUTEROL SULFATE HFA 108 (90 BASE) MCG/ACT IN AERS
2.0000 | INHALATION_SPRAY | Freq: Four times a day (QID) | RESPIRATORY_TRACT | 0 refills | Status: DC | PRN
  Filled 2022-10-01: qty 18, 25d supply, fill #0

## 2022-10-02 ENCOUNTER — Other Ambulatory Visit: Payer: Self-pay

## 2022-10-07 ENCOUNTER — Other Ambulatory Visit (HOSPITAL_COMMUNITY): Payer: Self-pay

## 2022-10-21 ENCOUNTER — Other Ambulatory Visit (HOSPITAL_COMMUNITY): Payer: Self-pay

## 2022-10-21 ENCOUNTER — Other Ambulatory Visit: Payer: Self-pay

## 2022-10-21 ENCOUNTER — Other Ambulatory Visit: Payer: Self-pay | Admitting: Nurse Practitioner

## 2022-10-21 MED ORDER — LISINOPRIL 20 MG PO TABS
20.0000 mg | ORAL_TABLET | Freq: Every day | ORAL | 0 refills | Status: DC
  Filled 2022-10-21 (×4): qty 30, 30d supply, fill #0

## 2022-10-22 ENCOUNTER — Other Ambulatory Visit: Payer: Self-pay

## 2022-10-22 ENCOUNTER — Other Ambulatory Visit: Payer: Self-pay | Admitting: Physician Assistant

## 2022-10-22 ENCOUNTER — Other Ambulatory Visit: Payer: Self-pay | Admitting: Nurse Practitioner

## 2022-10-22 ENCOUNTER — Other Ambulatory Visit (HOSPITAL_COMMUNITY): Payer: Self-pay

## 2022-10-22 DIAGNOSIS — E1165 Type 2 diabetes mellitus with hyperglycemia: Secondary | ICD-10-CM

## 2022-10-22 MED ORDER — HUMULIN 70/30 KWIKPEN (70-30) 100 UNIT/ML ~~LOC~~ SUPN
14.0000 [IU] | PEN_INJECTOR | Freq: Two times a day (BID) | SUBCUTANEOUS | 1 refills | Status: DC
  Filled 2022-10-22: qty 15, 54d supply, fill #0
  Filled 2023-01-07: qty 15, 54d supply, fill #1

## 2022-10-22 MED ORDER — JANUMET 50-1000 MG PO TABS
1.0000 | ORAL_TABLET | Freq: Two times a day (BID) | ORAL | 1 refills | Status: DC
  Filled 2022-10-22: qty 60, 30d supply, fill #0
  Filled 2022-12-07: qty 60, 30d supply, fill #1

## 2022-11-04 ENCOUNTER — Other Ambulatory Visit: Payer: Self-pay | Admitting: Family Medicine

## 2022-11-04 DIAGNOSIS — I10 Essential (primary) hypertension: Secondary | ICD-10-CM

## 2022-11-04 NOTE — Telephone Encounter (Signed)
Requested medication (s) are due for refill today: yes  Requested medication (s) are on the active medication list: yes  Last refill:  10/01/22  Future visit scheduled: no  Notes to clinic:  Unable to refill per protocol, courtesy refill already given, routing for provider approval.      Requested Prescriptions  Pending Prescriptions Disp Refills   amLODipine (NORVASC) 10 MG tablet 30 tablet 0    Sig: Take 1 tablet (10 mg total) by mouth daily.     Cardiovascular: Calcium Channel Blockers 2 Failed - 11/04/2022  8:29 AM      Failed - Valid encounter within last 6 months    Recent Outpatient Visits           8 months ago Anxiety and depression   [facility] [place], [place] W, NP   9 months ago Type 2 diabetes mellitus with hyperglycemia, without long-term current use of insulin [facility])   [place] [facility] [place], Shea Stakes, NP   9 months ago Vaginal itching   [place] [facility] Storm Frisk, MD   1 year ago Essential hypertension   [place] [facility] Claiborne Rigg, NP   1 year ago Essential hypertension   [place] [facility] Georganna Skeans, MD              Passed - Last BP in normal range    BP Readings from Last 1 Encounters:  08/11/22 131/87         Passed - Last Heart Rate in normal range    Pulse Readings from Last 1 Encounters:  08/11/22 (!) 106          rosuvastatin (CRESTOR) 20 MG tablet 30 tablet 0    Sig: Take 1 tablet (20 mg total) by mouth daily.     Cardiovascular:  Antilipid - Statins 2 Failed - 11/04/2022  8:29 AM      Failed - Cr in normal range and within 360 days    Creatinine  Date Value Ref Range Status  08/23/2019 0.74 0.44 - 1.00 mg/dL Final   Creat  Date Value Ref Range Status  07/06/2016 0.71 0.50 - 1.10 mg/dL Final   Creatinine, Ser  Date Value Ref Range Status  08/11/2022 0.56 (L) 0.57  - 1.00 mg/dL Final   Creatinine, Urine  Date Value Ref Range Status  07/06/2016 122 20 - 320 mg/dL Final         Failed - Lipid Panel in normal range within the last 12 months    Cholesterol, Total  Date Value Ref Range Status  01/03/2021 190 100 - 199 mg/dL Final   LDL Chol Calc (NIH)  Date Value Ref Range Status  01/03/2021 107 (H) 0 - 99 mg/dL Final   HDL  Date Value Ref Range Status  01/03/2021 48 >39 mg/dL Final   Triglycerides  Date Value Ref Range Status  01/03/2021 205 (H) 0 - 149 mg/dL Final         Passed - Patient is not pregnant      Passed - Valid encounter within last 12 months    Recent Outpatient Visits           8 months ago Anxiety and depression    [facility] [place], [place] W, NP   9 months ago Type 2 diabetes mellitus with hyperglycemia, without long-term current  use of insulin [facility])   [place] [facility] [place], Shea Stakes, NP   9 months ago Vaginal itching   [place] [facility] Storm Frisk, MD   1 year ago Essential hypertension   [place] [facility] Claiborne Rigg, NP   1 year ago Essential hypertension   [place] [facility] Georganna Skeans, MD               lisinopril (ZESTRIL) 20 MG tablet 30 tablet 0    Sig: Take 1 tablet (20 mg total) by mouth daily. (Must have office visit for refills)     Cardiovascular:  ACE Inhibitors Failed - 11/04/2022  8:29 AM      Failed - Cr in normal range and within 180 days    Creatinine  Date Value Ref Range Status  08/23/2019 0.74 0.44 - 1.00 mg/dL Final   Creat  Date Value Ref Range Status  07/06/2016 0.71 0.50 - 1.10 mg/dL Final   Creatinine, Ser  Date Value Ref Range Status  08/11/2022 0.56 (L) 0.57 - 1.00 mg/dL Final   Creatinine, Urine  Date Value Ref Range Status  07/06/2016 122 20 - 320 mg/dL Final         Failed - Valid encounter within  last 6 months    Recent Outpatient Visits           8 months ago Anxiety and depression   [place] [facility] [place], [place] W, NP   9 months ago Type 2 diabetes mellitus with hyperglycemia, without long-term current use of insulin [facility])   [place] [facility] [place], Shea Stakes, NP   9 months ago Vaginal itching   [place] [facility] Storm Frisk, MD   1 year ago Essential hypertension   [place] [place] [facility] Claiborne Rigg, NP   1 year ago Essential hypertension    [facility] Georganna Skeans, MD              Passed - K in normal range and within 180 days    Potassium  Date Value Ref Range Status  08/11/2022 4.0 3.5 - 5.2 mmol/L Final         Passed - Patient is not pregnant      Passed - Last BP in normal range    BP Readings from Last 1 Encounters:  08/11/22 131/87

## 2022-11-05 ENCOUNTER — Other Ambulatory Visit: Payer: Self-pay

## 2022-11-05 ENCOUNTER — Telehealth: Payer: Self-pay | Admitting: Nurse Practitioner

## 2022-11-05 DIAGNOSIS — I10 Essential (primary) hypertension: Secondary | ICD-10-CM

## 2022-11-05 MED ORDER — AMLODIPINE BESYLATE 10 MG PO TABS
10.0000 mg | ORAL_TABLET | Freq: Every day | ORAL | 0 refills | Status: DC
  Filled 2022-11-05: qty 30, 30d supply, fill #0

## 2022-11-05 MED ORDER — LISINOPRIL 20 MG PO TABS
20.0000 mg | ORAL_TABLET | Freq: Every day | ORAL | 0 refills | Status: DC
  Filled 2022-11-05 – 2022-12-14 (×3): qty 30, 30d supply, fill #0

## 2022-11-05 MED ORDER — ROSUVASTATIN CALCIUM 20 MG PO TABS
20.0000 mg | ORAL_TABLET | Freq: Every day | ORAL | 0 refills | Status: DC
  Filled 2022-11-05: qty 30, 30d supply, fill #0

## 2022-11-05 NOTE — Telephone Encounter (Signed)
Medication Refill - Medication: rosuvastatin (CRESTOR) 20 MG tablet amLODipine (NORVASC) 10 MG tablet   Has the patient contacted their pharmacy? Yes.   (Agent: If no, request that the patient contact the pharmacy for the refill. If patient does not wish to contact the pharmacy document the reason why and proceed with request.) Will not refill without appt/ pt made appt Sat 6/22 Pt request enough meds to get her to appt and have mailed to her. Call [phone] She is out of meds as of today (Agent: If yes, when and what did the pharmacy advise?) Call dr Pt not seen by PCP since Sept 23  Preferred Pharmacy (with phone number or street name):  [facility] Phone: [phone]  Fax: [phone]     Has the patient been seen for an appointment in the last year OR does the patient have an upcoming appointment? Yes.   11/21/22  Agent: Please be advised that RX refills may take up to 3 business days. We ask that you follow-up with your pharmacy.

## 2022-11-05 NOTE — Telephone Encounter (Signed)
Rxns sent.  

## 2022-11-21 ENCOUNTER — Ambulatory Visit: Payer: Medicaid Other | Admitting: Family

## 2022-11-21 ENCOUNTER — Telehealth: Payer: Self-pay | Admitting: Family

## 2022-11-21 ENCOUNTER — Encounter: Payer: Self-pay | Admitting: Family

## 2022-11-21 NOTE — Telephone Encounter (Signed)
Called pt to remind of scheduled appt, pt said she will not make it to appt due to family emergency and will be out of town. Pt requested to be scheduled for next Saturday available 07/27.

## 2022-11-30 ENCOUNTER — Other Ambulatory Visit: Payer: Self-pay | Admitting: Nurse Practitioner

## 2022-12-01 ENCOUNTER — Other Ambulatory Visit (HOSPITAL_COMMUNITY): Payer: Self-pay

## 2022-12-01 MED ORDER — ETONOGESTREL-ETHINYL ESTRADIOL 0.12-0.015 MG/24HR VA RING
VAGINAL_RING | VAGINAL | 0 refills | Status: DC
  Filled 2022-12-01: qty 3, 84d supply, fill #0

## 2022-12-02 ENCOUNTER — Other Ambulatory Visit: Payer: Self-pay

## 2022-12-07 ENCOUNTER — Other Ambulatory Visit (HOSPITAL_COMMUNITY): Payer: Self-pay

## 2022-12-07 ENCOUNTER — Encounter: Payer: Self-pay | Admitting: Pharmacist

## 2022-12-07 ENCOUNTER — Other Ambulatory Visit: Payer: Self-pay

## 2022-12-07 ENCOUNTER — Other Ambulatory Visit: Payer: Self-pay | Admitting: Family Medicine

## 2022-12-07 DIAGNOSIS — I10 Essential (primary) hypertension: Secondary | ICD-10-CM

## 2022-12-09 ENCOUNTER — Other Ambulatory Visit (HOSPITAL_COMMUNITY): Payer: Self-pay

## 2022-12-09 MED ORDER — ROSUVASTATIN CALCIUM 20 MG PO TABS
20.0000 mg | ORAL_TABLET | Freq: Every day | ORAL | 0 refills | Status: DC
  Filled 2022-12-09: qty 30, 30d supply, fill #0

## 2022-12-09 MED ORDER — AMLODIPINE BESYLATE 10 MG PO TABS
10.0000 mg | ORAL_TABLET | Freq: Every day | ORAL | 0 refills | Status: DC
  Filled 2022-12-09: qty 30, 30d supply, fill #0

## 2022-12-10 ENCOUNTER — Other Ambulatory Visit: Payer: Self-pay

## 2022-12-10 ENCOUNTER — Other Ambulatory Visit (HOSPITAL_COMMUNITY): Payer: Self-pay

## 2022-12-14 ENCOUNTER — Other Ambulatory Visit: Payer: Self-pay

## 2022-12-25 ENCOUNTER — Other Ambulatory Visit: Payer: Self-pay | Admitting: Nurse Practitioner

## 2022-12-25 DIAGNOSIS — E1165 Type 2 diabetes mellitus with hyperglycemia: Secondary | ICD-10-CM

## 2022-12-25 NOTE — Telephone Encounter (Signed)
Medication Refill - Medication: Rx #: [id number]  insulin isophane & regular human KwikPen (HUMULIN 70/30 KWIKPEN) (70-30) 100 UNIT/ML KwikPen [[id number]]   Medication sitaGLIPtin-metformin (JANUMET) 50-1000 MG tablet [[id number]] Rx #: [id number] sitaGLIPtin-metformin (JANUMET) 50-1000 MG tablet [[id number]]     Has the patient contacted their pharmacy? Yes.   (Agent: If no, request that the patient contact the pharmacy for the refill. If patient does not wish to contact the pharmacy document the reason why and proceed with request.) (Agent: If yes, when and what did the pharmacy advise?)  Preferred Pharmacy (with phone number or street name):  [facility] Phone: [phone]  Fax: [phone]     Has the patient been seen for an appointment in the last year OR does the patient have an upcoming appointment? Yes.  01/13/23  Agent: Please be advised that RX refills may take up to 3 business days. We ask that you follow-up with your pharmacy.

## 2022-12-26 ENCOUNTER — Ambulatory Visit: Payer: Medicaid Other | Admitting: Family

## 2022-12-28 ENCOUNTER — Encounter: Payer: Self-pay | Admitting: Pharmacist

## 2022-12-28 ENCOUNTER — Other Ambulatory Visit: Payer: Self-pay

## 2022-12-28 MED ORDER — JANUMET 50-1000 MG PO TABS
1.0000 | ORAL_TABLET | Freq: Two times a day (BID) | ORAL | 0 refills | Status: DC
  Filled 2022-12-28 – 2023-01-07 (×2): qty 60, 30d supply, fill #0

## 2022-12-28 NOTE — Telephone Encounter (Signed)
Requested medication (s) are due for refill today:   Yes for both  Requested medication (s) are on the active medication list:   Yes for both  Future visit scheduled:   Yes 8/14 with Marylene Land   Last ordered: Janumet 10/22/2022 #60, 1 refill;   Insulin isophane and regular Kwikkpen Humulin 70/30 Kwikpen 10/22/2022 15 ml, 1 refill  Returned because labs are due per protocol.   Provider to review for refill with upcoming appt.   Requested Prescriptions  Pending Prescriptions Disp Refills   sitaGLIPtin-metformin (JANUMET) 50-1000 MG tablet 60 tablet 1    Sig: Take 1 tablet by mouth 2 (two) times daily with a meal.     Endocrinology:  Diabetes - Biguanide + DPP-4 Inhibitor Combos Failed - 12/28/2022  8:17 AM      Failed - HBA1C is between 0 and 7.9 and within 180 days    HbA1c, POC (controlled diabetic range)  Date Value Ref Range Status  08/13/2021 11.9 (A) 0.0 - 7.0 % Final   Hgb A1c MFr Bld  Date Value Ref Range Status  07/01/2022 9.7 (H) 4.8 - 5.6 % Final    Comment:    (NOTE) Pre diabetes:          5.7%-6.4%  Diabetes:              >6.4%  Glycemic control for   <7.0% adults with diabetes          Failed - Cr in normal range and within 360 days    Creatinine  Date Value Ref Range Status  08/23/2019 0.74 0.44 - 1.00 mg/dL Final   Creat  Date Value Ref Range Status  07/06/2016 0.71 0.50 - 1.10 mg/dL Final   Creatinine, Ser  Date Value Ref Range Status  08/11/2022 0.56 (L) 0.57 - 1.00 mg/dL Final   Creatinine, Urine  Date Value Ref Range Status  07/06/2016 122 20 - 320 mg/dL Final         Failed - B12 Level in normal range and within 720 days    No results found for: "VITAMINB12"       Failed - Valid encounter within last 6 months    Recent Outpatient Visits           1 month ago Essential hypertension   [place] [facility] Eleonore Chiquito, FNP   10 months ago Anxiety and depression   [place] [facility]  [place], [place] W, NP   11 months ago Type 2 diabetes mellitus with hyperglycemia, without long-term current use of insulin [facility])   [place] [facility] [place], Shea Stakes, NP   11 months ago Vaginal itching   [place] [facility] Storm Frisk, MD   1 year ago Essential hypertension   [place] [facility] [place], Shea Stakes, NP       Future Appointments             In 2 weeks Anders Simmonds, [place] [facility]            Passed - eGFR in normal range and within 360 days    GFR, Est African American  Date Value Ref Range Status  07/06/2016 >89 >=60 mL/min Final   GFR, Est AFR Am  Date Value Ref Range Status  08/23/2019 >60 >60 mL/min Final   GFR calc Af Denyse Dago  Date Value  Ref Range Status  02/28/2020 120 >59 mL/min/1.73 Final    Comment:    **[facility] currently reports eGFR in compliance with the current**   recommendations of the [employer]. [facility] will   update reporting as new guidelines are published from the NKF-ASN   Task force.    GFR, Est Non African American  Date Value Ref Range Status  07/06/2016 >89 >=60 mL/min Final   GFR, Estimated  Date Value Ref Range Status  07/03/2022 >60 >60 mL/min Final    Comment:    (NOTE) Calculated using the CKD-EPI Creatinine Equation (2021)   08/23/2019 >60 >60 mL/min Final   eGFR  Date Value Ref Range Status  08/11/2022 118 >59 mL/min/1.73 Final         Passed - CBC within normal limits and completed in the last 12 months    WBC  Date Value Ref Range Status  08/11/2022 16.5 (H) 3.4 - 10.8 x10E3/uL Final  07/03/2022 16.1 (H) 4.0 - 10.5 K/uL Final   RBC  Date Value Ref Range Status  08/11/2022 4.86 3.77 - 5.28 x10E6/uL Final  07/03/2022 3.70 (L) 3.87 - 5.11 MIL/uL Final   Hemoglobin  Date Value Ref Range Status  08/11/2022 13.9 11.1 - 15.9 g/dL Final   Hematocrit  Date Value Ref  Range Status  08/11/2022 42.4 34.0 - 46.6 % Final   MCHC  Date Value Ref Range Status  08/11/2022 32.8 31.5 - 35.7 g/dL Final  29/51/8841 66.0 30.0 - 36.0 g/dL Final   [facility]  Date Value Ref Range Status  08/11/2022 28.6 26.6 - 33.0 pg Final  07/03/2022 28.4 26.0 - 34.0 pg Final   MCV  Date Value Ref Range Status  08/11/2022 87 79 - 97 fL Final   No results found for: "PLTCOUNTKUC", "LABPLAT", "POCPLA" RDW  Date Value Ref Range Status  08/11/2022 14.2 11.7 - 15.4 % Final

## 2022-12-31 ENCOUNTER — Other Ambulatory Visit: Payer: Self-pay

## 2023-01-07 ENCOUNTER — Other Ambulatory Visit (HOSPITAL_COMMUNITY): Payer: Self-pay

## 2023-01-07 ENCOUNTER — Other Ambulatory Visit: Payer: Self-pay

## 2023-01-12 ENCOUNTER — Other Ambulatory Visit: Payer: Self-pay | Admitting: Nurse Practitioner

## 2023-01-12 ENCOUNTER — Other Ambulatory Visit: Payer: Self-pay | Admitting: Family Medicine

## 2023-01-12 DIAGNOSIS — I10 Essential (primary) hypertension: Secondary | ICD-10-CM

## 2023-01-12 MED ORDER — LISINOPRIL 20 MG PO TABS
20.0000 mg | ORAL_TABLET | Freq: Every day | ORAL | 0 refills | Status: DC
  Filled 2023-01-12: qty 30, 30d supply, fill #0

## 2023-01-12 MED ORDER — ROSUVASTATIN CALCIUM 20 MG PO TABS
20.0000 mg | ORAL_TABLET | Freq: Every day | ORAL | 0 refills | Status: DC
  Filled 2023-01-12: qty 30, 30d supply, fill #0

## 2023-01-12 MED ORDER — AMLODIPINE BESYLATE 10 MG PO TABS
10.0000 mg | ORAL_TABLET | Freq: Every day | ORAL | 0 refills | Status: DC
  Filled 2023-01-12: qty 30, 30d supply, fill #0

## 2023-01-13 ENCOUNTER — Ambulatory Visit: Payer: Medicaid Other | Admitting: Physician Assistant

## 2023-01-13 ENCOUNTER — Other Ambulatory Visit (HOSPITAL_COMMUNITY): Payer: Self-pay

## 2023-01-25 ENCOUNTER — Emergency Department (HOSPITAL_BASED_OUTPATIENT_CLINIC_OR_DEPARTMENT_OTHER)
Admission: EM | Admit: 2023-01-25 | Discharge: 2023-01-26 | Disposition: A | Discharge status: Home / Self Care | Payer: Medicaid Other | Source: Home / Self Care | Attending: Emergency Medicine | Admitting: Emergency Medicine

## 2023-01-25 ENCOUNTER — Other Ambulatory Visit: Payer: Self-pay

## 2023-01-25 ENCOUNTER — Encounter (HOSPITAL_BASED_OUTPATIENT_CLINIC_OR_DEPARTMENT_OTHER): Payer: Self-pay

## 2023-01-25 ENCOUNTER — Emergency Department (HOSPITAL_BASED_OUTPATIENT_CLINIC_OR_DEPARTMENT_OTHER): Payer: Medicaid Other

## 2023-01-25 DIAGNOSIS — Z7984 Long term (current) use of oral hypoglycemic drugs: Secondary | ICD-10-CM | POA: Diagnosis not present

## 2023-01-25 DIAGNOSIS — Z794 Long term (current) use of insulin: Secondary | ICD-10-CM | POA: Insufficient documentation

## 2023-01-25 DIAGNOSIS — N3000 Acute cystitis without hematuria: Secondary | ICD-10-CM | POA: Diagnosis not present

## 2023-01-25 DIAGNOSIS — N12 Tubulo-interstitial nephritis, not specified as acute or chronic: Secondary | ICD-10-CM | POA: Diagnosis not present

## 2023-01-25 DIAGNOSIS — Z79899 Other long term (current) drug therapy: Secondary | ICD-10-CM | POA: Diagnosis not present

## 2023-01-25 DIAGNOSIS — R000 Tachycardia, unspecified: Secondary | ICD-10-CM | POA: Diagnosis not present

## 2023-01-25 DIAGNOSIS — R0602 Shortness of breath: Secondary | ICD-10-CM | POA: Diagnosis present

## 2023-01-25 DIAGNOSIS — E119 Type 2 diabetes mellitus without complications: Secondary | ICD-10-CM | POA: Diagnosis not present

## 2023-01-25 DIAGNOSIS — U071 COVID-19: Secondary | ICD-10-CM | POA: Diagnosis not present

## 2023-01-25 DIAGNOSIS — I10 Essential (primary) hypertension: Secondary | ICD-10-CM | POA: Insufficient documentation

## 2023-01-25 DIAGNOSIS — N3289 Other specified disorders of bladder: Secondary | ICD-10-CM | POA: Diagnosis not present

## 2023-01-25 LAB — URINALYSIS, MICROSCOPIC (REFLEX)

## 2023-01-25 LAB — CBC WITH DIFFERENTIAL/PLATELET
Abs Immature Granulocytes: 0.06 10*3/uL (ref 0.00–0.07)
Basophils Absolute: 0.1 10*3/uL (ref 0.0–0.1)
Basophils Relative: 1 %
Eosinophils Absolute: 0 10*3/uL (ref 0.0–0.5)
Eosinophils Relative: 0 %
HCT: 40.5 % (ref 36.0–46.0)
Hemoglobin: 13.9 g/dL (ref 12.0–15.0)
Immature Granulocytes: 1 %
Lymphocytes Relative: 18 %
Lymphs Abs: 2.2 10*3/uL (ref 0.7–4.0)
MCH: 28.3 pg (ref 26.0–34.0)
MCHC: 34.3 g/dL (ref 30.0–36.0)
MCV: 82.3 fL (ref 80.0–100.0)
Monocytes Absolute: 1.4 10*3/uL — ABNORMAL HIGH (ref 0.1–1.0)
Monocytes Relative: 12 %
Neutro Abs: 8.1 10*3/uL — ABNORMAL HIGH (ref 1.7–7.7)
Neutrophils Relative %: 68 %
Platelets: 367 10*3/uL (ref 150–400)
RBC: 4.92 MIL/uL (ref 3.87–5.11)
RDW: 14.4 % (ref 11.5–15.5)
WBC: 11.8 10*3/uL — ABNORMAL HIGH (ref 4.0–10.5)
nRBC: 0 % (ref 0.0–0.2)

## 2023-01-25 LAB — RESP PANEL BY RT-PCR (RSV, FLU A&B, COVID)  RVPGX2
Influenza A by PCR: NEGATIVE
Influenza B by PCR: NEGATIVE
Resp Syncytial Virus by PCR: NEGATIVE
SARS Coronavirus 2 by RT PCR: POSITIVE — AB

## 2023-01-25 LAB — COMPREHENSIVE METABOLIC PANEL
ALT: 32 U/L (ref 0–44)
AST: 48 U/L — ABNORMAL HIGH (ref 15–41)
Albumin: 3.4 g/dL — ABNORMAL LOW (ref 3.5–5.0)
Alkaline Phosphatase: 52 U/L (ref 38–126)
Anion gap: 15 (ref 5–15)
BUN: 9 mg/dL (ref 6–20)
CO2: 17 mmol/L — ABNORMAL LOW (ref 22–32)
Calcium: 9.2 mg/dL (ref 8.9–10.3)
Chloride: 103 mmol/L (ref 98–111)
Creatinine, Ser: 0.81 mg/dL (ref 0.44–1.00)
GFR, Estimated: >60 mL/min (ref >60)
Glucose, Bld: 291 mg/dL — ABNORMAL HIGH (ref 70–99)
Potassium: 3.7 mmol/L (ref 3.5–5.1)
Sodium: 135 mmol/L (ref 135–145)
Total Bilirubin: 0.7 mg/dL (ref 0.3–1.2)
Total Protein: 7.5 g/dL (ref 6.5–8.1)

## 2023-01-25 LAB — URINALYSIS, ROUTINE W REFLEX MICROSCOPIC
Bilirubin Urine: NEGATIVE
Glucose, UA: 250 mg/dL — AB
Ketones, ur: 15 mg/dL — AB
Nitrite: POSITIVE — AB
Protein, ur: NEGATIVE mg/dL
Specific Gravity, Urine: 1.025 (ref 1.005–1.030)
pH: 5.5 (ref 5.0–8.0)

## 2023-01-25 LAB — PREGNANCY, URINE: Preg Test, Ur: NEGATIVE

## 2023-01-25 MED ORDER — CEPHALEXIN 500 MG PO CAPS
500.0000 mg | ORAL_CAPSULE | Freq: Three times a day (TID) | ORAL | 0 refills | Status: AC
  Filled 2023-01-25 – 2023-01-26 (×2): qty 21, 7d supply, fill #0

## 2023-01-25 MED ORDER — SODIUM CHLORIDE 0.9 % IV BOLUS
1000.0000 mL | Freq: Once | INTRAVENOUS | Status: AC
  Administered 2023-01-25: 1000 mL via INTRAVENOUS

## 2023-01-25 MED ORDER — IOHEXOL 300 MG/ML  SOLN
100.0000 mL | Freq: Once | INTRAMUSCULAR | Status: AC | PRN
  Administered 2023-01-25: 100 mL via INTRAVENOUS

## 2023-01-25 MED ORDER — CEPHALEXIN 250 MG PO CAPS
500.0000 mg | ORAL_CAPSULE | Freq: Once | ORAL | Status: AC
  Administered 2023-01-26: 500 mg via ORAL
  Filled 2023-01-25: qty 2

## 2023-01-25 NOTE — ED Provider Notes (Signed)
[address] [facility] Provider Note   CSN: [id number] Arrival date & time: 01/25/23  2104     History  Chief Complaint  Patient presents with   Shortness of Breath    Crystina Flasher is a 42 y.o. female.   Shortness of Breath Patient with foot tired fatigue chills.  Started feeling bad yesterday.  History of diabetes.  Also had sepsis around 8 months ago.  No definite sick contacts.  States she has social anxiety mostly stays at home.  States she ate some orange juice earlier today thinks her sugar may be up from that.  Denies dysuria but does not know if she has a urinary tract infection.    Past Medical History:  Diagnosis Date   Diabetes mellitus without complication (HCC)    Hyperlipidemia    Hypertension     Home Medications Prior to Admission medications   Medication Sig Start Date End Date Taking? Authorizing Provider  Accu-Chek Softclix Lancets lancets Use up to four times daily as directed 07/03/22   Rodolph Bong, MD  acetaminophen (TYLENOL) 325 MG tablet Take 2 tablets (650 mg total) by mouth every 6 (six) hours as needed for mild pain, fever or headache (temp>100.8). Patient not taking: Reported on 08/11/2022 07/03/22   Rodolph Bong, MD  albuterol (VENTOLIN HFA) 108 (90 Base) MCG/ACT inhaler Inhale 2 puffs into the lungs every 6 (six) hours as needed for wheezing or shortness of breath. 10/01/22   Hoy Register, MD  amLODipine (NORVASC) 10 MG tablet Take 1 tablet (10 mg total) by mouth daily. 01/12/23   Hoy Register, MD  blood glucose meter kit and supplies Use up to four times daily as directed 07/03/22   Rodolph Bong, MD  Blood Pressure Monitor DEVI Please provide patient with insurance approved blood pressure monitor 12/24/18   Claiborne Rigg, NP  etonogestrel-ethinyl estradiol (NUVARING) 0.12-0.015 MG/24HR vaginal ring Insert vaginally and leave in place for 3 consecutive weeks, then remove for 1 week. 12/01/22    Hoy Register, MD  glucose blood test strip Use up to four times daily as directed 07/03/22   Rodolph Bong, MD  insulin isophane & regular human KwikPen (HUMULIN 70/30 KWIKPEN) (70-30) 100 UNIT/ML KwikPen Inject 14 Units into the skin 2 (two) times daily. 10/22/22   Claiborne Rigg, NP  Insulin Pen Needle (PEN NEEDLES 3/16") 31G X 5 MM MISC Use 2 times a day as directed 08/27/22   Claiborne Rigg, NP  lisinopril (ZESTRIL) 20 MG tablet Take 1 tablet (20 mg total) by mouth daily. (Must have office visit for refills) 01/12/23   Hoy Register, MD  Misc. Devices MISC Please provide patient with insurance approved blood pressure monitor Patient not taking: Reported on 08/11/2022 12/24/18   Claiborne Rigg, NP  nicotine (NICODERM CQ - DOSED IN MG/24 HOURS) 21 mg/24hr patch Place 1 patch (21 mg total) onto the skin daily as needed (Nicotine withdrawal symptoms.). Patient not taking: Reported on 08/11/2022 07/03/22   Rodolph Bong, MD  ondansetron (ZOFRAN-ODT) 4 MG disintegrating tablet Dissolve 1 tablet by mouth every 8 hour for up to 7 days as needed for nausea. Patient not taking: Reported on 08/11/2022 07/03/22   Rodolph Bong, MD  pantoprazole (PROTONIX) 40 MG tablet Take 1 tablet (40 mg total) by mouth daily. Patient not taking: Reported on 08/11/2022 07/03/22   Rodolph Bong, MD  rosuvastatin (CRESTOR) 20 MG tablet Take 1 tablet (20 mg  total) by mouth daily. 01/12/23   Hoy Register, MD  sitaGLIPtin-metformin (JANUMET) 50-1000 MG tablet Take 1 tablet by mouth 2 (two) times daily with a meal. 12/28/22   Hoy Register, MD      Allergies    Patient has no known allergies.    Review of Systems   Review of Systems  Respiratory:  Positive for shortness of breath.     Physical Exam Updated Vital Signs BP 134/88   Pulse (!) 109   Temp 98 F (36.7 C)   Resp 17   Ht 5\' 2"  (1.575 m)   Wt 81.6 kg   SpO2 98%   BMI 32.92 kg/m  Physical Exam Vitals and nursing note reviewed.   Pulmonary:     Breath sounds: No rhonchi or rales.  Chest:     Chest wall: No tenderness.  Abdominal:     Tenderness: There is no abdominal tenderness.  Musculoskeletal:     Cervical back: Neck supple.     Right lower leg: No edema.     Left lower leg: No edema.  Neurological:     Mental Status: She is alert.     ED Results / Procedures / Treatments   Labs (all labs ordered are listed, but only abnormal results are displayed) Labs Reviewed  RESP PANEL BY RT-PCR (RSV, FLU A&B, COVID)  RVPGX2 - Abnormal; Notable for the following components:      Result Value   SARS Coronavirus 2 by RT PCR POSITIVE (*)    All other components within normal limits  COMPREHENSIVE METABOLIC PANEL - Abnormal; Notable for the following components:   CO2 17 (*)    Glucose, Bld 291 (*)    Albumin 3.4 (*)    AST 48 (*)    All other components within normal limits  CBC WITH DIFFERENTIAL/PLATELET - Abnormal; Notable for the following components:   WBC 11.8 (*)    Neutro Abs 8.1 (*)    Monocytes Absolute 1.4 (*)    All other components within normal limits  URINALYSIS, ROUTINE W REFLEX MICROSCOPIC - Abnormal; Notable for the following components:   APPearance CLOUDY (*)    Glucose, UA 250 (*)    Hgb urine dipstick MODERATE (*)    Ketones, ur 15 (*)    Nitrite POSITIVE (*)    Leukocytes,Ua TRACE (*)    All other components within normal limits  URINALYSIS, MICROSCOPIC (REFLEX) - Abnormal; Notable for the following components:   Bacteria, UA MANY (*)    All other components within normal limits  PREGNANCY, URINE  URINALYSIS, W/ REFLEX TO CULTURE (INFECTION SUSPECTED)    EKG None  Radiology CT ABDOMEN PELVIS W CONTRAST  Result Date: 01/25/2023 CLINICAL DATA:  COVID-19 positivity with generalized fatigue and body aches, history of pyelonephritis, initial encounter EXAM: CT ABDOMEN AND PELVIS WITH CONTRAST TECHNIQUE: Multidetector CT imaging of the abdomen and pelvis was performed using the  standard protocol following bolus administration of intravenous contrast. RADIATION DOSE REDUCTION: This exam was performed according to the departmental dose-optimization program which includes automated exposure control, adjustment of the mA and/or kV according to patient size and/or use of iterative reconstruction technique. CONTRAST:  OMNIPAQUE IOHEXOL 300 MG/ML  SOLN COMPARISON:  06/29/2022 FINDINGS: Lower chest: No acute abnormality. Hepatobiliary: No focal liver abnormality is seen. No gallstones, gallbladder wall thickening, or biliary dilatation. Pancreas: Unremarkable. No pancreatic ductal dilatation or surrounding inflammatory changes. Spleen: Normal in size without focal abnormality. Adrenals/Urinary Tract: Adrenal glands are within normal  limits. Kidneys demonstrate a normal enhancement pattern bilaterally. No renal calculi are noted. No obstructive changes are seen. The bladder is partially distended. Stomach/Bowel: No obstructive or inflammatory changes of the colon are noted. The appendix is within normal limits. Small bowel and stomach are unremarkable. Vascular/Lymphatic: No significant vascular findings are present. No enlarged abdominal or pelvic lymph nodes. Reproductive: Uterus and bilateral adnexa are unremarkable. NuvaRing is noted in place. Other: No abdominal wall hernia or abnormality. No abdominopelvic ascites. Musculoskeletal: No acute or significant osseous findings. IMPRESSION: No acute abnormality is noted to correspond with the given clinical history. Specifically no findings to suggest pyelonephritis are noted. Electronically Signed   By: Alcide Clever M.D.   On: 01/25/2023 23:00    Procedures Procedures    Medications Ordered in ED Medications  sodium chloride 0.9 % bolus 1,000 mL (1,000 mLs Intravenous New Bag/Given 01/25/23 2216)  iohexol (OMNIPAQUE) 300 MG/ML solution 100 mL (100 mLs Intravenous Contrast Given 01/25/23 2235)    ED Course/ Medical Decision Making/  A&P                                 Medical Decision Making Amount and/or Complexity of Data Reviewed Labs: ordered. Radiology: ordered.  Risk Prescription drug management.   Patient with various complaints.  Fatigue shortness of breath.  Feeling rundown.  Differential diagnose includes various infections.  Denies dysuria but does not know if she has urinary tract infection.  Did have sepsis back few months ago from UTI.  Reviewed discharge note and radiology report and had potential renal abscesses.  Repeat CT scan had been recommended and will be done today.  Initial urinalysis contaminated with skin cells but showed potential infection.  Has had previous Klebsiella UTI both in January and again in March.  Will recheck urine. Lungs are clear but does have positive COVID test which could do definitely the fatigue and shortness of breath.  CT scan reassuring.  No pyelonephritis.  Urinalysis still pending.  Care turned over to Dr.Bero.         Final Clinical Impression(s) / ED Diagnoses Final diagnoses:  None    Rx / DC Orders ED Discharge Orders     None         Benjiman Core, MD 01/25/23 2306

## 2023-01-25 NOTE — ED Notes (Signed)
Report given to Sean

## 2023-01-25 NOTE — ED Notes (Signed)
Pt. Reports she started feeling tired yesterday and run down with no fever.  Pt. Reports a past history of sepsis and was scared of happening again.  Pt. Had no diarrhea or vomiting.   Pt. Reports her urine being dark.   Pt. Reports drinking lots of orange juice.

## 2023-01-25 NOTE — Discharge Instructions (Signed)
You were evaluated in the Emergency Department and after careful evaluation, we did not find any emergent condition requiring admission or further testing in the hospital.  Your exam/testing today was overall reassuring.  Symptoms likely due to Covid-19.  You may also have a UTI.  Take the keflex antibiotic as directed.  Plenty of fluids and rest at home.  Please return to the Emergency Department if you experience any worsening of your condition.  Thank you for allowing [place] to be a part of your care.

## 2023-01-25 NOTE — ED Triage Notes (Addendum)
Pt states she is having SHOB and fatigued C/o generalized body aches Decreased appetite

## 2023-01-25 NOTE — ED Provider Notes (Signed)
  Provider Note MRN:  [id number]  Arrival date & time: 01/25/23    ED Course and Medical Decision Making  Assumed care from Dr. Rubin Payor at shift change.  Fatigue and SOB, body aches.  Positive for covid.  Also h/o uti with sepsis awaiting ct and UA.  12:00am update:  patient with normal vitals and feeling better after fluids.  CT unremarkable.  UA is nitrite positive, will treat.  No indication for further testing or admission, appropriate for discharge.  Procedures  Final Clinical Impressions(s) / ED Diagnoses     ICD-10-CM   1. COVID-19  U07.1     2. Acute cystitis without hematuria  N30.00       ED Discharge Orders          Ordered    cephALEXin (KEFLEX) 500 MG capsule  3 times daily        01/25/23 2351              Discharge Instructions      You were evaluated in the Emergency Department and after careful evaluation, we did not find any emergent condition requiring admission or further testing in the hospital.  Your exam/testing today was overall reassuring.  Symptoms likely due to Covid-19.  You may also have a UTI.  Take the keflex antibiotic as directed.  Plenty of fluids and rest at home.  Please return to the Emergency Department if you experience any worsening of your condition.  Thank you for allowing [place] to be a part of your care.     Elmer Sow. Pilar Plate, MD [facility] Emergency Medicine [facility] [email or web address]    Sabas Sous, MD 01/25/23 [id number]

## 2023-01-25 NOTE — ED Notes (Signed)
Pt. Aware that we need another UA .

## 2023-01-26 ENCOUNTER — Other Ambulatory Visit (HOSPITAL_COMMUNITY): Payer: Self-pay

## 2023-01-26 ENCOUNTER — Other Ambulatory Visit: Payer: Self-pay

## 2023-01-26 ENCOUNTER — Other Ambulatory Visit: Payer: Self-pay | Admitting: Nurse Practitioner

## 2023-01-26 DIAGNOSIS — F17200 Nicotine dependence, unspecified, uncomplicated: Secondary | ICD-10-CM

## 2023-01-26 MED ORDER — ALBUTEROL SULFATE HFA 108 (90 BASE) MCG/ACT IN AERS
2.0000 | INHALATION_SPRAY | Freq: Four times a day (QID) | RESPIRATORY_TRACT | 0 refills | Status: DC | PRN
  Filled 2023-01-26: qty 18, 25d supply, fill #0

## 2023-01-27 ENCOUNTER — Other Ambulatory Visit: Payer: Self-pay

## 2023-01-27 ENCOUNTER — Ambulatory Visit: Payer: Medicaid Other | Admitting: Physician Assistant

## 2023-01-28 ENCOUNTER — Other Ambulatory Visit: Payer: Self-pay

## 2023-01-28 LAB — URINE CULTURE: Culture: >=100000 — AB

## 2023-01-29 ENCOUNTER — Telehealth (HOSPITAL_BASED_OUTPATIENT_CLINIC_OR_DEPARTMENT_OTHER): Payer: Self-pay

## 2023-01-29 NOTE — Telephone Encounter (Signed)
Post ED Visit - Positive Culture Follow-up  Culture report reviewed by antimicrobial stewardship pharmacist: [facility] [x]  Ivery Quale, [place].D. []  Celedonio Miyamoto, Pharm.D., BCPS AQ-ID []  Garvin Fila, Pharm.D., BCPS []  Georgina Pillion, Pharm.D., BCPS []  [place], [address].D., BCPS, AAHIVP []  Estella Husk, Pharm.D., BCPS, AAHIVP []  Lysle Pearl, PharmD, BCPS []  Phillips Climes, PharmD, BCPS []  Agapito Games, PharmD, BCPS []  Verlan Friends, PharmD []  Mervyn Gay, PharmD, BCPS []  Vinnie Level, PharmD  [facility] []  Len Childs, PharmD []  Greer Pickerel, PharmD []  Adalberto Cole, PharmD []  Perlie Gold, Rph []  Lonell Face) Jean Rosenthal, PharmD []  Earl Many, PharmD []  Junita Push, PharmD []  Dorna Leitz, PharmD []  Terrilee Files, PharmD []  Lynann Beaver, PharmD []  Keturah Barre, PharmD []  Loralee Pacas, PharmD []  Bernadene Person, PharmD   Positive urine culture Treated with Cephalexin, organism sensitive to the same and no further patient follow-up is required at this time.  Sandria Senter 01/29/2023, 10:53 AM

## 2023-02-10 ENCOUNTER — Ambulatory Visit: Payer: Medicaid Other | Admitting: Physician Assistant

## 2023-02-16 ENCOUNTER — Other Ambulatory Visit: Payer: Self-pay | Admitting: Nurse Practitioner

## 2023-02-16 ENCOUNTER — Other Ambulatory Visit: Payer: Self-pay | Admitting: Family Medicine

## 2023-02-16 DIAGNOSIS — E1165 Type 2 diabetes mellitus with hyperglycemia: Secondary | ICD-10-CM

## 2023-02-16 DIAGNOSIS — I10 Essential (primary) hypertension: Secondary | ICD-10-CM

## 2023-02-17 ENCOUNTER — Other Ambulatory Visit (HOSPITAL_COMMUNITY): Payer: Self-pay

## 2023-02-17 MED ORDER — ETONOGESTREL-ETHINYL ESTRADIOL 0.12-0.015 MG/24HR VA RING
VAGINAL_RING | VAGINAL | 0 refills | Status: DC
  Filled 2023-02-17: qty 3, 84d supply, fill #0

## 2023-02-17 MED ORDER — ROSUVASTATIN CALCIUM 20 MG PO TABS
20.0000 mg | ORAL_TABLET | Freq: Every day | ORAL | 0 refills | Status: DC
  Filled 2023-02-17: qty 30, 30d supply, fill #0

## 2023-02-17 MED ORDER — LISINOPRIL 20 MG PO TABS
20.0000 mg | ORAL_TABLET | Freq: Every day | ORAL | 0 refills | Status: DC
  Filled 2023-02-17: qty 30, 30d supply, fill #0

## 2023-02-17 MED ORDER — PEN NEEDLES 3/16" 31G X 5 MM MISC
14.0000 [IU] | Freq: Two times a day (BID) | 0 refills | Status: AC
Start: 2023-02-17 — End: ?
  Filled 2023-02-17: qty 100, 34d supply, fill #0

## 2023-02-17 MED ORDER — JANUMET 50-1000 MG PO TABS
1.0000 | ORAL_TABLET | Freq: Two times a day (BID) | ORAL | 0 refills | Status: AC
Start: 2023-02-17 — End: ?
  Filled 2023-02-17: qty 60, 30d supply, fill #0

## 2023-02-17 MED ORDER — AMLODIPINE BESYLATE 10 MG PO TABS
10.0000 mg | ORAL_TABLET | Freq: Every day | ORAL | 0 refills | Status: DC
  Filled 2023-02-17: qty 30, 30d supply, fill #0

## 2023-02-17 NOTE — Telephone Encounter (Signed)
Pt. Has appointment 03/03/23. Requested Prescriptions  Pending Prescriptions Disp Refills   etonogestrel-ethinyl estradiol (NUVARING) 0.12-0.015 MG/24HR vaginal ring 3 each 0    Sig: Insert vaginally and leave in place for 3 consecutive weeks, then remove for 1 week.     OB/GYN:  Contraceptives Failed - 02/16/2023 10:25 AM      Failed - Valid encounter within last 12 months    Recent Outpatient Visits           2 months ago Essential hypertension   [place] [facility] [place], Jomarie Longs, [place]   11 months ago Anxiety and depression   [place] [facility] [place], Shea Stakes, NP   1 year ago Type 2 diabetes mellitus with hyperglycemia, without long-term current use of insulin (HCC)   [place] [facility] [place], Shea Stakes, NP   1 year ago Vaginal itching   [place] [facility] Storm Frisk, MD   1 year ago Essential hypertension   [place] [facility] [place], Shea Stakes, NP       Future Appointments             In 2 weeks Anders Simmonds, [place] [facility]            Failed - Patient is not a smoker      Passed - Last BP in normal range    BP Readings from Last 1 Encounters:  01/26/23 130/84          sitaGLIPtin-metformin (JANUMET) 50-1000 MG tablet 60 tablet 0    Sig: Take 1 tablet by mouth 2 (two) times daily with a meal.     Endocrinology:  Diabetes - Biguanide + DPP-4 Inhibitor Combos Failed - 02/16/2023 10:25 AM      Failed - HBA1C is between 0 and 7.9 and within 180 days    HbA1c, POC (controlled diabetic range)  Date Value Ref Range Status  08/13/2021 11.9 (A) 0.0 - 7.0 % Final   Hgb A1c MFr Bld  Date Value Ref Range Status  07/01/2022 9.7 (H) 4.8 - 5.6 % Final    Comment:    (NOTE) Pre diabetes:          5.7%-6.4%  Diabetes:              >6.4%  Glycemic control for   <7.0% adults with  diabetes          Failed - B12 Level in normal range and within 720 days    No results found for: "VITAMINB12"       Failed - Valid encounter within last 6 months    Recent Outpatient Visits           2 months ago Essential hypertension   [facility] Eleonore Chiquito, FNP   11 months ago Anxiety and depression   [place] [facility] [place], [place] W, NP   1 year ago Type 2 diabetes mellitus with hyperglycemia, without long-term current use of insulin [facility])   [place] [facility] [place], Shea Stakes, NP   1 year ago Vaginal itching   [place] [facility] Storm Frisk, MD   1 year ago Essential hypertension    [facility] [place], Shea Stakes, NP  Future Appointments             In 2 weeks Sharon Seller [place]            Passed - Cr in normal range and within 360 days    Creatinine  Date Value Ref Range Status  08/23/2019 0.74 0.44 - 1.00 mg/dL Final   Creat  Date Value Ref Range Status  07/06/2016 0.71 0.50 - 1.10 mg/dL Final   Creatinine, Ser  Date Value Ref Range Status  01/25/2023 0.81 0.44 - 1.00 mg/dL Final   Creatinine, Urine  Date Value Ref Range Status  07/06/2016 122 20 - 320 mg/dL Final         Passed - eGFR in normal range and within 360 days    GFR, Est African American  Date Value Ref Range Status  07/06/2016 >89 >=60 mL/min Final   GFR, Est AFR Am  Date Value Ref Range Status  08/23/2019 >60 >60 mL/min Final   GFR calc Af Amer  Date Value Ref Range Status  02/28/2020 120 >59 mL/min/1.73 Final    Comment:    **[facility] currently reports eGFR in compliance with the current**   recommendations of the [employer]. [facility] will   update reporting as new guidelines are published from the NKF-ASN   Task force.    GFR, Est Non  African American  Date Value Ref Range Status  07/06/2016 >89 >=60 mL/min Final   GFR, Estimated  Date Value Ref Range Status  01/25/2023 >60 >60 mL/min Final    Comment:    (NOTE) Calculated using the CKD-EPI Creatinine Equation (2021)   08/23/2019 >60 >60 mL/min Final   eGFR  Date Value Ref Range Status  08/11/2022 118 >59 mL/min/1.73 Final         Passed - CBC within normal limits and completed in the last 12 months    WBC  Date Value Ref Range Status  01/25/2023 11.8 (H) 4.0 - 10.5 K/uL Final   RBC  Date Value Ref Range Status  01/25/2023 4.92 3.87 - 5.11 MIL/uL Final   Hemoglobin  Date Value Ref Range Status  01/25/2023 13.9 12.0 - 15.0 g/dL Final  08/65/7846 96.2 11.1 - 15.9 g/dL Final   HCT  Date Value Ref Range Status  01/25/2023 40.5 36.0 - 46.0 % Final   Hematocrit  Date Value Ref Range Status  08/11/2022 42.4 34.0 - 46.6 % Final   MCHC  Date Value Ref Range Status  01/25/2023 34.3 30.0 - 36.0 g/dL Final   [facility]  Date Value Ref Range Status  01/25/2023 28.3 26.0 - 34.0 pg Final   MCV  Date Value Ref Range Status  01/25/2023 82.3 80.0 - 100.0 fL Final  08/11/2022 87 79 - 97 fL Final   No results found for: "PLTCOUNTKUC", "LABPLAT", "POCPLA" RDW  Date Value Ref Range Status  01/25/2023 14.4 11.5 - 15.5 % Final  08/11/2022 14.2 11.7 - 15.4 % Final          lisinopril (ZESTRIL) 20 MG tablet 30 tablet 0    Sig: Take 1 tablet (20 mg total) by mouth daily. (Must have office visit for refills)     Cardiovascular:  ACE Inhibitors Failed - 02/16/2023 10:25 AM      Failed - Valid encounter within last 6 months    Recent Outpatient Visits           2 months ago Essential hypertension  [place] [facility] [place], Jomarie Longs, [place]   11 months ago Anxiety and depression   [place] [facility] [place], [place] W, NP   1 year ago Type 2 diabetes mellitus with hyperglycemia, without long-term current use  of insulin [facility])   [place] [facility] [place], Shea Stakes, NP   1 year ago Vaginal itching   [place] [facility] Storm Frisk, MD   1 year ago Essential hypertension   [place] [facility] [place], Shea Stakes, NP       Future Appointments             In 2 weeks Anders Simmonds, [place] [facility]            Passed - Cr in normal range and within 180 days    Creatinine  Date Value Ref Range Status  08/23/2019 0.74 0.44 - 1.00 mg/dL Final   Creat  Date Value Ref Range Status  07/06/2016 0.71 0.50 - 1.10 mg/dL Final   Creatinine, Ser  Date Value Ref Range Status  01/25/2023 0.81 0.44 - 1.00 mg/dL Final   Creatinine, Urine  Date Value Ref Range Status  07/06/2016 122 20 - 320 mg/dL Final         Passed - K in normal range and within 180 days    Potassium  Date Value Ref Range Status  01/25/2023 3.7 3.5 - 5.1 mmol/L Final         Passed - Patient is not pregnant      Passed - Last BP in normal range    BP Readings from Last 1 Encounters:  01/26/23 130/84          amLODipine (NORVASC) 10 MG tablet 30 tablet 0    Sig: Take 1 tablet (10 mg total) by mouth daily.     Cardiovascular: Calcium Channel Blockers 2 Failed - 02/16/2023 10:25 AM      Failed - Valid encounter within last 6 months    Recent Outpatient Visits           2 months ago Essential hypertension   [place] [facility] Eleonore Chiquito, [place]   11 months ago Anxiety and depression   [place] [facility] [place], [place] W, NP   1 year ago Type 2 diabetes mellitus with hyperglycemia, without long-term current use of insulin [facility])   [place] [facility] [place], Shea Stakes, NP   1 year ago Vaginal itching   [place] [facility] Storm Frisk, MD   1 year ago Essential hypertension    [place] [facility] Claiborne Rigg, NP       Future Appointments             In 2 weeks Anders Simmonds, [place]  [facility]            Passed - Last BP in normal range    BP Readings from Last 1 Encounters:  01/26/23 130/84         Passed - Last Heart Rate in normal range    Pulse Readings from Last 1 Encounters:  01/26/23 92          rosuvastatin (CRESTOR) 20 MG tablet 30 tablet 0    Sig: Take  1 tablet (20 mg total) by mouth daily.     Cardiovascular:  Antilipid - Statins 2 Failed - 02/16/2023 10:25 AM      Failed - Valid encounter within last 12 months    Recent Outpatient Visits           2 months ago Essential hypertension   [place] [facility] [place], Jomarie Longs, [place]   11 months ago Anxiety and depression   [place] [facility] [place], [place] W, NP   1 year ago Type 2 diabetes mellitus with hyperglycemia, without long-term current use of insulin [facility])   [place] [facility] [place], [place] W, NP   1 year ago Vaginal itching   [place] [facility] Storm Frisk, MD   1 year ago Essential hypertension   [place] [facility] [place], Shea Stakes, NP       Future Appointments             In 2 weeks Anders Simmonds, [place] [facility]            Failed - Lipid Panel in normal range within the last 12 months    Cholesterol, Total  Date Value Ref Range Status  01/03/2021 190 100 - 199 mg/dL Final   LDL Chol Calc (NIH)  Date Value Ref Range Status  01/03/2021 107 (H) 0 - 99 mg/dL Final   HDL  Date Value Ref Range Status  01/03/2021 48 >39 mg/dL Final   Triglycerides  Date Value Ref Range Status  01/03/2021 205 (H) 0 - 149 mg/dL Final         Passed - Cr in normal range and within 360 days    Creatinine  Date Value Ref  Range Status  08/23/2019 0.74 0.44 - 1.00 mg/dL Final   Creat  Date Value Ref Range Status  07/06/2016 0.71 0.50 - 1.10 mg/dL Final   Creatinine, Ser  Date Value Ref Range Status  01/25/2023 0.81 0.44 - 1.00 mg/dL Final   Creatinine, Urine  Date Value Ref Range Status  07/06/2016 122 20 - 320 mg/dL Final         Passed - Patient is not pregnant

## 2023-02-17 NOTE — Telephone Encounter (Signed)
Requested Prescriptions  Pending Prescriptions Disp Refills   Insulin Pen Needle (PEN NEEDLES 3/16") 31G X 5 MM MISC 100 each 0    Sig: Use 2 times a day as directed     Endocrinology: Diabetes - Testing Supplies Failed - 02/16/2023 10:25 AM      Failed - Valid encounter within last 12 months    Recent Outpatient Visits           2 months ago Essential hypertension   [employer] [facility] Eleonore Chiquito, [place]   11 months ago Anxiety and depression   [place] [facility] [place], [place] W, NP   1 year ago Type 2 diabetes mellitus with hyperglycemia, without long-term current use of insulin [place] [facility])   [place] [facility] [place], Shea Stakes, NP   1 year ago Vaginal itching   [place] [facility] Storm Frisk, MD   1 year ago Essential hypertension   [place] [facility] Claiborne Rigg, NP       Future Appointments             In 2 weeks Sharon Seller, [place] [facility]

## 2023-03-03 ENCOUNTER — Ambulatory Visit: Payer: Medicaid Other | Attending: Family | Admitting: Physician Assistant

## 2023-03-03 ENCOUNTER — Other Ambulatory Visit (HOSPITAL_COMMUNITY): Payer: Self-pay

## 2023-03-03 ENCOUNTER — Encounter: Payer: Self-pay | Admitting: Physician Assistant

## 2023-03-03 ENCOUNTER — Other Ambulatory Visit: Payer: Self-pay

## 2023-03-03 VITALS — BP 142/94 | HR 106 | Wt 187.8 lb

## 2023-03-03 DIAGNOSIS — I10 Essential (primary) hypertension: Secondary | ICD-10-CM | POA: Diagnosis not present

## 2023-03-03 DIAGNOSIS — Z8616 Personal history of COVID-19: Secondary | ICD-10-CM | POA: Diagnosis not present

## 2023-03-03 DIAGNOSIS — E1169 Type 2 diabetes mellitus with other specified complication: Secondary | ICD-10-CM

## 2023-03-03 DIAGNOSIS — E1165 Type 2 diabetes mellitus with hyperglycemia: Secondary | ICD-10-CM | POA: Diagnosis not present

## 2023-03-03 DIAGNOSIS — Z794 Long term (current) use of insulin: Secondary | ICD-10-CM | POA: Diagnosis not present

## 2023-03-03 DIAGNOSIS — Z09 Encounter for follow-up examination after completed treatment for conditions other than malignant neoplasm: Secondary | ICD-10-CM | POA: Diagnosis not present

## 2023-03-03 DIAGNOSIS — E785 Hyperlipidemia, unspecified: Secondary | ICD-10-CM | POA: Diagnosis not present

## 2023-03-03 DIAGNOSIS — R110 Nausea: Secondary | ICD-10-CM | POA: Diagnosis not present

## 2023-03-03 DIAGNOSIS — Z7984 Long term (current) use of oral hypoglycemic drugs: Secondary | ICD-10-CM

## 2023-03-03 DIAGNOSIS — F17200 Nicotine dependence, unspecified, uncomplicated: Secondary | ICD-10-CM

## 2023-03-03 DIAGNOSIS — M79673 Pain in unspecified foot: Secondary | ICD-10-CM

## 2023-03-03 LAB — POCT GLYCOSYLATED HEMOGLOBIN (HGB A1C): HbA1c, POC (controlled diabetic range): 7.4 % — AB (ref 0.0–7.0)

## 2023-03-03 LAB — GLUCOSE, POCT (MANUAL RESULT ENTRY): POC Glucose: 278 mg/dL — AB (ref 70–99)

## 2023-03-03 MED ORDER — ONDANSETRON HCL 4 MG PO TABS
4.0000 mg | ORAL_TABLET | Freq: Three times a day (TID) | ORAL | 0 refills | Status: DC | PRN
  Filled 2023-03-03 (×2): qty 20, 7d supply, fill #0

## 2023-03-03 MED ORDER — HUMULIN 70/30 KWIKPEN (70-30) 100 UNIT/ML ~~LOC~~ SUPN
14.0000 [IU] | PEN_INJECTOR | Freq: Two times a day (BID) | SUBCUTANEOUS | 3 refills | Status: DC
  Filled 2023-03-03: qty 15, 53d supply, fill #0
  Filled 2023-03-03 – 2023-03-11 (×2): qty 15, 54d supply, fill #0
  Filled 2023-05-19: qty 15, 54d supply, fill #1
  Filled 2023-06-28 – 2023-06-30 (×2): qty 15, 54d supply, fill #2
  Filled 2023-09-27: qty 15, 54d supply, fill #3

## 2023-03-03 MED ORDER — "PEN NEEDLES 3/16"" 31G X 5 MM MISC"
14.0000 [IU] | Freq: Two times a day (BID) | 0 refills | Status: DC
  Filled 2023-03-03: qty 100, 34d supply, fill #0

## 2023-03-03 MED ORDER — PANTOPRAZOLE SODIUM 40 MG PO TBEC
40.0000 mg | DELAYED_RELEASE_TABLET | Freq: Every day | ORAL | 1 refills | Status: AC
  Filled 2023-03-03 (×2): qty 90, 90d supply, fill #0

## 2023-03-03 MED ORDER — JANUMET 50-1000 MG PO TABS
1.0000 | ORAL_TABLET | Freq: Two times a day (BID) | ORAL | 0 refills | Status: DC
  Filled 2023-03-03 – 2023-03-18 (×2): qty 180, 90d supply, fill #0

## 2023-03-03 MED ORDER — AMLODIPINE BESYLATE 10 MG PO TABS
10.0000 mg | ORAL_TABLET | Freq: Every day | ORAL | 0 refills | Status: DC
  Filled 2023-03-03 – 2023-03-18 (×2): qty 90, 90d supply, fill #0

## 2023-03-03 MED ORDER — LISINOPRIL 20 MG PO TABS
20.0000 mg | ORAL_TABLET | Freq: Every day | ORAL | 1 refills | Status: DC
  Filled 2023-03-03 – 2023-03-18 (×2): qty 90, 90d supply, fill #0
  Filled 2023-06-28: qty 90, 90d supply, fill #1

## 2023-03-03 MED ORDER — ROSUVASTATIN CALCIUM 20 MG PO TABS
20.0000 mg | ORAL_TABLET | Freq: Every day | ORAL | 1 refills | Status: DC
  Filled 2023-03-03 – 2023-03-18 (×2): qty 90, 90d supply, fill #0
  Filled 2023-06-28: qty 90, 90d supply, fill #1

## 2023-03-03 MED ORDER — VENTOLIN HFA 108 (90 BASE) MCG/ACT IN AERS
1.0000 | INHALATION_SPRAY | Freq: Four times a day (QID) | RESPIRATORY_TRACT | 0 refills | Status: DC | PRN
  Filled 2023-03-03: qty 54, 75d supply, fill #0
  Filled 2023-03-03: qty 54, fill #0

## 2023-03-03 NOTE — Progress Notes (Signed)
Patient ID: Samantha [name], female   DOB: [birth year], 42 y.o.   MRN: [id number]    Samantha [name], is a 41 y.o. female  [id number]  [id number]  DOB - 01-30-1981  Chief Complaint  Patient presents with   Medication Refill   Medical Management of Chronic Issues       Subjective:   Samantha [name] is a 42 y.o. female here today for a follow up visit ater being seen in the Ed for Covid on 01/25/2023.  She was also treated with cephalexin for a UTI.  She is doing much better from all of that.  She does have some nausea last night and today.  No residual cough.    She has not taken insulin today.  Says she usu takes her insulin 90% of the time.  She is pretty much out of everything and has not taken meds today.  Does not check blood sugars regularly.  No new issues or concerns today.  She does want to see a podiatrist.    No problems updated.  ALLERGIES: No Known Allergies  PAST MEDICAL HISTORY: Past Medical History:  Diagnosis Date   Diabetes mellitus without complication (HCC)    Hyperlipidemia    Hypertension     MEDICATIONS AT HOME: Prior to Admission medications   Medication Sig Start Date End Date Taking? Authorizing Provider  Accu-Chek Softclix Lancets lancets Use up to four times daily as directed 07/03/22  Yes Rodolph Bong, MD  albuterol (VENTOLIN HFA) 108 (90 Base) MCG/ACT inhaler Inhale 1-2 puffs into the lungs every 6 (six) hours as needed for wheezing or shortness of breath. 03/03/23  Yes Georgian Co M, PA-C  blood glucose meter kit and supplies Use up to four times daily as directed 07/03/22  Yes Rodolph Bong, MD  Blood Pressure Monitor DEVI Please provide patient with insurance approved blood pressure monitor 12/24/18  Yes Claiborne Rigg, NP  etonogestrel-ethinyl estradiol (NUVARING) 0.12-0.015 MG/24HR vaginal ring Insert vaginally and leave in place for 3 consecutive weeks, then remove for 1 week. 02/17/23  Yes Claiborne Rigg, NP  glucose blood  test strip Use up to four times daily as directed 07/03/22  Yes Rodolph Bong, MD  Misc. Devices MISC Please provide patient with insurance approved blood pressure monitor 12/24/18  Yes Claiborne Rigg, NP  ondansetron (ZOFRAN) 4 MG tablet Take 1 tablet (4 mg total) by mouth every 8 (eight) hours as needed for nausea or vomiting. 03/03/23  Yes Georgian Co M, PA-C  acetaminophen (TYLENOL) 325 MG tablet Take 2 tablets (650 mg total) by mouth every 6 (six) hours as needed for mild pain, fever or headache (temp>100.8). Patient not taking: Reported on 08/11/2022 07/03/22   Rodolph Bong, MD  amLODipine (NORVASC) 10 MG tablet Take 1 tablet (10 mg total) by mouth daily. 03/03/23   Anders Simmonds, PA-C  insulin isophane & regular human KwikPen (HUMULIN 70/30 KWIKPEN) (70-30) 100 UNIT/ML KwikPen Inject 14 Units into the skin 2 (two) times daily. 03/03/23   Anders Simmonds, PA-C  Insulin Pen Needle (PEN NEEDLES 3/16") 31G X 5 MM MISC Use 2 times a day as directed 03/03/23   Anders Simmonds, PA-C  lisinopril (ZESTRIL) 20 MG tablet Take 1 tablet (20 mg total) by mouth daily. (Must have office visit for refills) 03/03/23   Anders Simmonds, PA-C  nicotine (NICODERM CQ - DOSED IN MG/24 HOURS) 21 mg/24hr patch Place 1 patch (21 mg total) onto the skin daily  as needed (Nicotine withdrawal symptoms.). Patient not taking: Reported on 08/11/2022 07/03/22   Rodolph Bong, MD  ondansetron (ZOFRAN-ODT) 4 MG disintegrating tablet Dissolve 1 tablet by mouth every 8 hour for up to 7 days as needed for nausea. Patient not taking: Reported on 08/11/2022 07/03/22   Rodolph Bong, MD  pantoprazole (PROTONIX) 40 MG tablet Take 1 tablet (40 mg total) by mouth daily. 03/03/23   Anders Simmonds, PA-C  rosuvastatin (CRESTOR) 20 MG tablet Take 1 tablet (20 mg total) by mouth daily. 03/03/23   Anders Simmonds, PA-C  sitaGLIPtin-metformin (JANUMET) 50-1000 MG tablet Take 1 tablet by mouth 2 (two) times daily with a  meal. 03/03/23   Jehad Bisono, Marzella Schlein, PA-C    ROS: Neg HEENT Neg resp Neg cardiac Neg GU Neg MS Neg psych Neg neuro  Objective:   Vitals:   03/03/23 1112 03/03/23 1133  BP: (!) 154/110 (!) 142/94  Pulse: (!) 117 (!) 106  SpO2: 98%   Weight: 187 lb 12.8 oz (85.2 kg)    Exam General appearance : Awake, alert, not in any distress. Speech Clear. Not toxic looking HEENT: Atraumatic and Normocephalic Neck: Supple, no JVD. No cervical lymphadenopathy.  Chest: Good air entry bilaterally, CTAB.  No rales/rhonchi/wheezing CVS: S1 S2 regular, no murmurs. Rate at 92 on exam Extremities: B/L Lower Ext shows no edema, both legs are warm to touch Neurology: Awake alert, and oriented X 3, CN II-XII intact, Non focal Skin: No Rash  Data Review Lab Results  Component Value Date   HGBA1C 7.4 (A) 03/03/2023   HGBA1C 9.7 (H) 07/01/2022   HGBA1C 7.2 (A) 01/30/2022    Assessment & Plan   1. Type 2 diabetes mellitus with hyperglycemia, unspecified whether long term insulin use (HCC) Increase water intake.  Work on 100% compliance and diabetic diet - POCT glucose (manual entry) - POCT glycosylated hemoglobin (Hb A1C) - insulin isophane & regular human KwikPen (HUMULIN 70/30 KWIKPEN) (70-30) 100 UNIT/ML KwikPen; Inject 14 Units into the skin 2 (two) times daily.  Dispense: 15 mL; Refill: 3 - sitaGLIPtin-metformin (JANUMET) 50-1000 MG tablet; Take 1 tablet by mouth 2 (two) times daily with a meal.  Dispense: 180 tablet; Refill: 0 - Insulin Pen Needle (PEN NEEDLES 3/16") 31G X 5 MM MISC; Use 2 times a day as directed  Dispense: 100 each; Refill: 0  2. Essential hypertension Resume meds(says Bp is controlled OOO) - amLODipine (NORVASC) 10 MG tablet; Take 1 tablet (10 mg total) by mouth daily.  Dispense: 90 tablet; Refill: 0 - lisinopril (ZESTRIL) 20 MG tablet; Take 1 tablet (20 mg total) by mouth daily. (Must have office visit for refills)  Dispense: 90 tablet; Refill: 1  3. Hyperlipidemia  associated with type 2 diabetes mellitus (HCC) - rosuvastatin (CRESTOR) 20 MG tablet; Take 1 tablet (20 mg total) by mouth daily.  Dispense: 90 tablet; Refill: 1  4. Encounter for examination following treatment at hospital  5. 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 Nausea occasional - ondansetron (ZOFRAN) 4 MG tablet; Take 1 tablet (4 mg total) by mouth every 8 (eight) hours as needed for nausea or vomiting.  Dispense: 20 tablet; Refill: 0  7. Tobacco dependence Smoking and dangers of nicotine have been discussed at length. Long term health consequences of smoking reviewed in detail.  Methods for helping with cessation have been reviewed.  Patient expresses understanding.   8. Long term current use of oral hypoglycemic drug On Janumet-  9. Long term (current) use of  insulin (HCC) -on 70/30 bid  10. Pain of foot, unspecified laterality Refer podiatry    Return in about 3 months (around 06/03/2023) for PCP for chronic conditions-[place].  The patient was given clear instructions to go to ER or return to medical center if symptoms don't improve, worsen or new problems develop. The patient verbalized understanding. The patient was told to call to get lab results if they haven't heard anything in the next week.      Georgian Co, PA-C [facility] [place], [place] [phone]   03/03/2023, 11:40 AM

## 2023-03-03 NOTE — Patient Instructions (Signed)
Drink 64 to 80 ounces water daily.   Work at a goal of eliminating sugary drinks, candy, desserts, sweets, refined sugars, processed foods, and white carbohydrates.

## 2023-03-04 ENCOUNTER — Other Ambulatory Visit: Payer: Self-pay

## 2023-03-04 ENCOUNTER — Other Ambulatory Visit (HOSPITAL_COMMUNITY): Payer: Self-pay

## 2023-03-11 ENCOUNTER — Other Ambulatory Visit: Payer: Self-pay

## 2023-03-11 ENCOUNTER — Other Ambulatory Visit (HOSPITAL_COMMUNITY): Payer: Self-pay

## 2023-03-16 ENCOUNTER — Encounter: Payer: Self-pay | Admitting: Podiatry

## 2023-03-16 ENCOUNTER — Other Ambulatory Visit: Payer: Self-pay

## 2023-03-16 ENCOUNTER — Other Ambulatory Visit (HOSPITAL_COMMUNITY): Payer: Self-pay

## 2023-03-16 ENCOUNTER — Ambulatory Visit: Payer: Medicaid Other | Admitting: Podiatry

## 2023-03-16 VITALS — Ht 62.0 in | Wt 187.0 lb

## 2023-03-16 DIAGNOSIS — B351 Tinea unguium: Secondary | ICD-10-CM

## 2023-03-16 DIAGNOSIS — E1142 Type 2 diabetes mellitus with diabetic polyneuropathy: Secondary | ICD-10-CM

## 2023-03-16 MED ORDER — TERBINAFINE HCL 250 MG PO TABS
250.0000 mg | ORAL_TABLET | Freq: Every day | ORAL | 0 refills | Status: AC
  Filled 2023-03-16: qty 30, 30d supply, fill #0
  Filled 2023-03-16: qty 90, 90d supply, fill #0

## 2023-03-16 NOTE — Progress Notes (Signed)
Subjective:  Patient ID: Samantha [name], female    DOB: 08-03-1980,  MRN: [id number]  Chief Complaint  Patient presents with   Diabetes    Patient is here for bilateral foot pain    Discussed the use of AI scribe software for clinical note transcription with the patient, who gave verbal consent to proceed.  History of Present Illness   The patient, a diabetic for 4-5 years, presents with tingling in the arch of her feet. She denies any burning, numbness, or pins and needles sensation. She reports that her most recent hemoglobin A1c was 7.4, a significant improvement from previous readings. She was hospitalized for sepsis a couple of months ago, but the cause was unknown. The patient also expresses concern about the discoloration and hardening of her toenails, which she has not had a pedicure for in a long time due to her condition.          Objective:    Physical Exam   EXTREMITIES: Pulses palpable, foot warm, well perfused. NEUROLOGICAL: Decreased peripheral sensation on plantar forefoot and pulse of toes. SKIN: Dystrophic, thickened, elongated, yellow-brown discolored toenails with pincer nail deformity of the right great toenail.            Results   LABS HbA1c: 7.4% (03/09/2023)      Assessment:   1. Onychomycosis   2. Diabetic peripheral neuropathy (HCC)      Plan:  Patient was evaluated and treated and all questions answered.  Assessment and Plan    Diabetic Peripheral Neuropathy   She reports a tingling sensation in the arch of her foot without accompanying burning or numbness. Her A1c has recently improved to 7.4% from higher levels. We discussed the importance of maintaining A1c below 7% to prevent further nerve damage and potentially improve symptoms. We will consider starting Gabapentin if symptoms persist or worsen over the next 3-6 months.  Onychomycosis   She presents with thickened, discolored toenails and a pincer nail deformity on the right great  toenail, likely secondary to a fungal infection. We will start Lamisil for the nail fungus and monitor for potential GI side effects. A follow-up in 3 months is scheduled to assess the response to treatment.  General Health Maintenance / Followup Plans   She should continue to monitor her feet daily for any new injuries or changes and consider medical-grade pedicures with trusted providers. A follow-up in 3 months or sooner if any worsening of symptoms or side effects from medication is advised.          Return in about 3 months (around 06/16/2023) for follow up after nail fungus treatment.

## 2023-03-16 NOTE — Patient Instructions (Signed)
VISIT SUMMARY:  During your visit, we discussed your diabetes and the tingling sensation in your feet. We also talked about the discoloration and hardening of your toenails. Your blood sugar levels have improved, which is great news. However, we need to keep working on it to prevent further nerve damage in your feet. We also decided to start treatment for the fungal infection in your toenails.  YOUR PLAN:  -DIABETIC PERIPHERAL NEUROPATHY: This is a condition where high blood sugar levels damage the nerves in your feet, causing sensations like tingling. We discussed the importance of keeping your blood sugar levels below 7% to prevent further nerve damage. If the tingling in your feet continues or gets worse, we may start you on a medication called Gabapentin.  -ONYCHOMYCOSIS: This is a fungal infection that has caused your toenails to become discolored and hard. We will start you on a medication called Lamisil to treat this. We will monitor you for any side effects, particularly in your stomach and intestines.  INSTRUCTIONS:  Please continue to check your feet every day for any new injuries or changes. Consider getting medical-grade pedicures from trusted providers. We will have a follow-up appointment in 3 months to see how you're doing with the treatment. If your symptoms get worse or you experience side effects from the medication, please contact us sooner.

## 2023-03-18 ENCOUNTER — Other Ambulatory Visit (HOSPITAL_COMMUNITY): Payer: Self-pay

## 2023-03-31 ENCOUNTER — Ambulatory Visit: Payer: Medicaid Other | Admitting: Nurse Practitioner

## 2023-04-01 ENCOUNTER — Other Ambulatory Visit (HOSPITAL_COMMUNITY)
Admission: RE | Admit: 2023-04-01 | Discharge: 2023-04-01 | Disposition: A | Discharge status: Home / Self Care | Payer: Medicaid Other | Source: Ambulatory Visit | Attending: Internal Medicine | Admitting: Internal Medicine

## 2023-04-01 ENCOUNTER — Encounter: Payer: Self-pay | Admitting: Internal Medicine

## 2023-04-01 ENCOUNTER — Ambulatory Visit: Payer: Medicaid Other | Attending: Internal Medicine | Admitting: Internal Medicine

## 2023-04-01 VITALS — BP 140/86 | HR 102 | Ht 62.0 in | Wt 185.0 lb

## 2023-04-01 DIAGNOSIS — I10 Essential (primary) hypertension: Secondary | ICD-10-CM | POA: Diagnosis not present

## 2023-04-01 DIAGNOSIS — Z124 Encounter for screening for malignant neoplasm of cervix: Secondary | ICD-10-CM

## 2023-04-01 NOTE — Progress Notes (Signed)
Patient ID: Samantha [name], female    DOB: 01-Aug-1980  MRN: [id number]  CC: Gynecologic Exam (Pap./Requesting a TB test/No to flu vax. )   Subjective: Samantha [name] is a 42 y.o. female who presents for chronic ds management. Her concerns today include:    GYN History:  Pt is G9P8 (1abortion) Any hx of abn paps?: no Menses regular or irregular?: only when she takes out Nuva ring. How long does menses last? 6-7 days Menstrual flow light or heavy?: moderate but heavy last times.  LNMP last wk Method of birth control?:  Nuva ring Any vaginal dischg at this time?: no Dysuria?: no Any hx of STI?: no Sexually active with how many partners: one female partner Desires STI screen: yes Last MMG: wants to wait until 65 Family hx of uterine, cervical or breast cancer?:  cousin with breast CA  HTN: on Norvasc 10 mg and Lisinopril.  Took meds 30 mins ago. Does not check BP.  Thinks BP elev because she ate sausage egg and biscuit this morning. Patient Active Problem List   Diagnosis Date Noted   Acute pyelonephritis 06/30/2022   Hypomagnesemia 06/30/2022   Hypokalemia 06/30/2022   Hypophosphatemia 06/30/2022   Hypocalcemia 06/30/2022   Protein-calorie malnutrition, mild (HCC) 06/30/2022   Normocytic anemia 06/30/2022   Hyponatremia 06/30/2022   Odynophagia 06/30/2022   Non-alcoholic fatty liver disease 06/30/2022   Class 1 obesity 06/30/2022   Vaginal itching 01/27/2022   Dysuria 01/27/2022   Colitis 01/27/2022   Chronic diarrhea 01/27/2022   Hyperlipidemia associated with type 2 diabetes mellitus (HCC) 04/26/2017   Type 2 diabetes mellitus with hyperglycemia (HCC) 11/12/2015   Essential hypertension 11/04/2014     Current Outpatient Medications on File Prior to Visit  Medication Sig Dispense Refill   Accu-Chek Softclix Lancets lancets Use up to four times daily as directed 100 each 0   albuterol (VENTOLIN HFA) 108 (90 Base) MCG/ACT inhaler Inhale 1-2 puffs into the lungs  every 6 (six) hours as needed for wheezing or shortness of breath. 54 g 0   amLODipine (NORVASC) 10 MG tablet Take 1 tablet (10 mg total) by mouth daily. 90 tablet 0   blood glucose meter kit and supplies Use up to four times daily as directed 1 each 0   etonogestrel-ethinyl estradiol (NUVARING) 0.12-0.015 MG/24HR vaginal ring Insert vaginally and leave in place for 3 consecutive weeks, then remove for 1 week. 3 each 0   glucose blood test strip Use up to four times daily as directed 100 each 0   insulin isophane & regular human KwikPen (HUMULIN 70/30 KWIKPEN) (70-30) 100 UNIT/ML KwikPen Inject 14 Units into the skin 2 (two) times daily. 15 mL 3   Insulin Pen Needle (PEN NEEDLES 3/16") 31G X 5 MM MISC Use 2 times a day as directed 100 each 0   lisinopril (ZESTRIL) 20 MG tablet Take 1 tablet (20 mg total) by mouth daily. (Must have office visit for refills) 90 tablet 1   rosuvastatin (CRESTOR) 20 MG tablet Take 1 tablet (20 mg total) by mouth daily. 90 tablet 1   sitaGLIPtin-metformin (JANUMET) 50-1000 MG tablet Take 1 tablet by mouth 2 (two) times daily with a meal. 180 tablet 0   terbinafine (LAMISIL) 250 MG tablet Take 1 tablet (250 mg total) by mouth daily. 90 tablet 0   Blood Pressure Monitor DEVI Please provide patient with insurance approved blood pressure monitor (Patient not taking: Reported on 04/01/2023) 1 Device 0   Misc. Devices MISC  Please provide patient with insurance approved blood pressure monitor (Patient not taking: Reported on 04/01/2023) 1 each 0   ondansetron (ZOFRAN) 4 MG tablet Take 1 tablet (4 mg total) by mouth every 8 (eight) hours as needed for nausea or vomiting. (Patient not taking: Reported on 04/01/2023) 20 tablet 0   pantoprazole (PROTONIX) 40 MG tablet Take 1 tablet (40 mg total) by mouth daily. (Patient not taking: Reported on 04/01/2023) 90 tablet 1   No current facility-administered medications on file prior to visit.    No Known Allergies  Social History    Socioeconomic History   Marital status: Single    Spouse name: Not on file   Number of children: Not on file   Years of education: Not on file   Highest education level: Not on file  Occupational History   Not on file  Tobacco Use   Smoking status: Every Day    Current packs/day: 0.50    Average packs/day: 0.5 packs/day for 0.4 years (0.2 ttl pk-yrs)    Types: Cigarettes   Smokeless tobacco: Never  Vaping Use   Vaping status: Never Used  Substance and Sexual Activity   Alcohol use: No   Drug use: No   Sexual activity: Yes    Birth control/protection: None  Other Topics Concern   Not on file  Social History Narrative   Not on file   Social Determinants of Health   Financial Resource Strain: Medium Risk (04/01/2023)   Overall Financial Resource Strain (CARDIA)    Difficulty of Paying Living Expenses: Somewhat hard  Food Insecurity: Food Insecurity Present (04/01/2023)   Hunger Vital Sign    Worried About Running Out of Food in the Last Year: Sometimes true    Ran Out of Food in the Last Year: Sometimes true  Transportation Needs: No Transportation Needs (04/01/2023)   PRAPARE - Administrator, Civil Service (Medical): No    Lack of Transportation (Non-Medical): No  Physical Activity: Inactive (04/01/2023)   Exercise Vital Sign    Days of Exercise per Week: 0 days    Minutes of Exercise per Session: 0 min  Stress: Stress Concern Present (04/01/2023)   [employer] of Occupational Health - Occupational Stress Questionnaire    Feeling of Stress : Rather much  Social Connections: Moderately Isolated (04/01/2023)   Social Connection and Isolation Panel [NHANES]    Frequency of Communication with Friends and Family: Once a week    Frequency of Social Gatherings with Friends and Family: Never    Attends Religious Services: More than 4 times per year    Active Member of [employer] or Organizations: Yes    Attends Banker Meetings: Never     Marital Status: Divorced  Catering manager Violence: Not At Risk (04/01/2023)   Humiliation, Afraid, Rape, and Kick questionnaire    Fear of Current or Ex-Partner: No    Emotionally Abused: No    Physically Abused: No    Sexually Abused: No    Family History  Problem Relation Age of Onset   Heart disease Mother    Lung cancer Father    Diabetes type II Other     No past surgical history on file.  ROS: Review of Systems Negative except as stated above  PHYSICAL EXAM: BP (!) 140/86   Pulse (!) 102   Ht 5\' 2"  (1.575 m)   Wt 185 lb (83.9 kg)   SpO2 99%   BMI 33.84 kg/m   Physical Exam  General appearance - alert, well appearing, and in no distress Mental status - normal mood, behavior, speech, dress, motor activity, and thought processes Pelvic - CMA Clarisa is present: normal external genitalia, vulva, vagina, cervix, uterus and adnexa      Latest Ref Rng & Units 01/25/2023    9:14 PM 08/11/2022    3:04 PM 07/03/2022    4:45 AM  CMP  Glucose 70 - 99 mg/dL 962  952  841   BUN 6 - 20 mg/dL 9  9  8    Creatinine 0.44 - 1.00 mg/dL 3.24  4.01  0.27   Sodium 135 - 145 mmol/L 135  138  136   Potassium 3.5 - 5.1 mmol/L 3.7  4.0  3.8   Chloride 98 - 111 mmol/L 103  101  106   CO2 22 - 32 mmol/L 17  18  22    Calcium 8.9 - 10.3 mg/dL 9.2  9.3  8.2   Total Protein 6.5 - 8.1 g/dL 7.5     Total Bilirubin 0.3 - 1.2 mg/dL 0.7     Alkaline Phos 38 - 126 U/L 52     AST 15 - 41 U/L 48     ALT 0 - 44 U/L 32      Lipid Panel     Component Value Date/Time   CHOL 190 01/03/2021 1115   TRIG 205 (H) 01/03/2021 1115   HDL 48 01/03/2021 1115   CHOLHDL 4.0 01/03/2021 1115   LDLCALC 107 (H) 01/03/2021 1115    CBC    Component Value Date/Time   WBC 11.8 (H) 01/25/2023 2114   RBC 4.92 01/25/2023 2114   HGB 13.9 01/25/2023 2114   HGB 13.9 08/11/2022 1504   HCT 40.5 01/25/2023 2114   HCT 42.4 08/11/2022 1504   PLT 367 01/25/2023 2114   PLT 487 (H) 08/11/2022 1504   MCV 82.3  01/25/2023 2114   MCV 87 08/11/2022 1504   MCH 28.3 01/25/2023 2114   MCHC 34.3 01/25/2023 2114   RDW 14.4 01/25/2023 2114   RDW 14.2 08/11/2022 1504   LYMPHSABS 2.2 01/25/2023 2114   LYMPHSABS 3.3 (H) 08/11/2022 1504   MONOABS 1.4 (H) 01/25/2023 2114   EOSABS 0.0 01/25/2023 2114   EOSABS 0.1 08/11/2022 1504   BASOSABS 0.1 01/25/2023 2114   BASOSABS 0.1 08/11/2022 1504    ASSESSMENT AND PLAN: 1. Pap smear for cervical cancer screening - Cytology - PAP - Cervicovaginal ancillary only  2. Essential hypertension DASH diet discussed and encouraged Continue Norvasc 10 mg and Lisinopril 20 mg Recheck BP with clin pharmacist in 2-4 wks  Patient was given the opportunity to ask questions.  Patient verbalized understanding of the plan and was able to repeat key elements of the plan.   This documentation was completed using Paediatric nurse.  Any transcriptional errors are unintentional.  No orders of the defined types were placed in this encounter.    Requested Prescriptions    No prescriptions requested or ordered in this encounter    Return for Appt with [facility] in 2 wks for BP check.  Jonah Blue, MD, FACP

## 2023-04-02 ENCOUNTER — Ambulatory Visit: Payer: Medicaid Other

## 2023-04-02 LAB — CERVICOVAGINAL ANCILLARY ONLY
Bacterial Vaginitis (gardnerella): POSITIVE — AB
Candida Glabrata: NEGATIVE
Candida Vaginitis: NEGATIVE
Chlamydia: NEGATIVE
Comment: NEGATIVE
Comment: NEGATIVE
Comment: NEGATIVE
Comment: NEGATIVE
Comment: NEGATIVE
Comment: NORMAL
Neisseria Gonorrhea: NEGATIVE
Trichomonas: NEGATIVE

## 2023-04-03 ENCOUNTER — Other Ambulatory Visit: Payer: Self-pay | Admitting: Internal Medicine

## 2023-04-03 ENCOUNTER — Other Ambulatory Visit (HOSPITAL_COMMUNITY): Payer: Self-pay

## 2023-04-03 MED ORDER — METRONIDAZOLE 500 MG PO TABS
500.0000 mg | ORAL_TABLET | Freq: Two times a day (BID) | ORAL | 0 refills | Status: DC
  Filled 2023-04-03: qty 14, 7d supply, fill #0

## 2023-04-05 ENCOUNTER — Ambulatory Visit: Payer: Medicaid Other

## 2023-04-05 ENCOUNTER — Ambulatory Visit: Payer: Self-pay

## 2023-04-05 LAB — CYTOLOGY - PAP
Comment: NEGATIVE
Diagnosis: NEGATIVE
High risk HPV: NEGATIVE

## 2023-04-05 NOTE — Telephone Encounter (Signed)
  Chief Complaint: Blurry vision - Needs new glasses Symptoms: can't see small print Frequency: ongoing Pertinent Negatives: Patient denies pain, HA Disposition: [] ED /[] Urgent Care (no appt availability in office) / [] Appointment(In office/virtual)/ []  [place] [facility]/ [] Home Care/ [] Refused Recommended Disposition /[] [place] [facility]/ [x]  Follow-up with PCP Additional Notes: Returned pt's call. Pt states that she cannot find an eye doctor that accepts medicaid. She states that she needs new eye glasses, as she cannot see fine print.  Please send referral to eye doctor that accepts medicaid.    Summary: Blurred vision   Pt is requesting a referral for an eye doctor, one that accepts medicaid. Has blurred vision  Best contact: [phone]  ----- Message from Dondra Prader E sent at 04/05/2023  9:24 AM EST ----- Pt is requesting a referral for an eye doctor, one that accepts medicaid. Has blurred vision  Best contact: [phone]     Reason for Disposition  [1] Blurred vision or visual changes AND [2] gradual onset (e.g., weeks, months)  Answer Assessment - Initial Assessment Questions 1. DESCRIPTION: "How has your vision changed?" (e.g., complete vision loss, blurred vision, double vision, floaters, etc.)     Blurry 2. LOCATION: "One or both eyes?" If one, ask: "Which eye?"     both 3. SEVERITY: "Can you see anything?" If Yes, ask: "What can you see?" (e.g., fine print)     Can't see fine print 4. ONSET: "When did this begin?" "Did it start suddenly or has this been gradual?"     Ongoing 5. PATTERN: "Does this come and go, or has it been constant since it started?"     Constant 6. PAIN: "Is there any pain in your eye(s)?"  (Scale 1-10; or mild, moderate, severe)   - NONE (0): No pain.   - MILD (1-3): Doesn't interfere with normal activities.   - MODERATE (4-7): Interferes with normal activities or awakens from sleep.    - SEVERE (8-10): Excruciating  pain, unable to do any normal activities.     none 7. CONTACTS-GLASSES: "Do you wear contacts or glasses?"     Yes - but rx is old. 8. CAUSE: "What do you think is causing this visual problem?"     Needs glasses 9. OTHER SYMPTOMS: "Do you have any other symptoms?" (e.g., confusion, headache, arm or leg weakness, speech problems)     no  Protocols used: Vision Loss or Change-A-AH

## 2023-04-05 NOTE — Telephone Encounter (Signed)
Spoke with patient . Patient voiced that she's changed insurance . Advised that I had a list of providers that serviced medicaid patients. Advised that she could also call the number on the back of her card and they would be able to guide her to a provider that accepts her insurance. Patient voiced that she would call her insurance for providers that will accept [place] medicaid

## 2023-04-07 ENCOUNTER — Ambulatory Visit: Payer: Medicaid Other

## 2023-04-26 ENCOUNTER — Encounter: Payer: Self-pay | Admitting: Internal Medicine

## 2023-05-04 ENCOUNTER — Encounter: Payer: Self-pay | Admitting: Nurse Practitioner

## 2023-05-06 ENCOUNTER — Other Ambulatory Visit: Payer: Self-pay | Admitting: Nurse Practitioner

## 2023-05-06 ENCOUNTER — Other Ambulatory Visit: Payer: Self-pay | Admitting: Internal Medicine

## 2023-05-06 DIAGNOSIS — B3731 Acute candidiasis of vulva and vagina: Secondary | ICD-10-CM

## 2023-05-06 MED ORDER — FLUCONAZOLE 150 MG PO TABS
150.0000 mg | ORAL_TABLET | ORAL | 0 refills | Status: DC
Start: 2023-05-06 — End: 2023-12-23
  Filled 2023-05-06: qty 2, 6d supply, fill #0

## 2023-05-06 NOTE — Telephone Encounter (Signed)
Diflucan sent for yeast. Her symptoms are not consistent with BV at this time. If she feels she needs something else she will need to do a self swab.

## 2023-05-07 ENCOUNTER — Other Ambulatory Visit (HOSPITAL_COMMUNITY): Payer: Self-pay

## 2023-05-07 ENCOUNTER — Other Ambulatory Visit: Payer: Self-pay

## 2023-05-07 ENCOUNTER — Encounter: Payer: Self-pay | Admitting: Pharmacist

## 2023-05-11 ENCOUNTER — Other Ambulatory Visit: Payer: Self-pay

## 2023-05-19 ENCOUNTER — Other Ambulatory Visit: Payer: Self-pay | Admitting: Nurse Practitioner

## 2023-05-20 ENCOUNTER — Other Ambulatory Visit (HOSPITAL_COMMUNITY): Payer: Self-pay

## 2023-05-20 ENCOUNTER — Other Ambulatory Visit: Payer: Self-pay

## 2023-05-20 MED ORDER — ETONOGESTREL-ETHINYL ESTRADIOL 0.12-0.015 MG/24HR VA RING
VAGINAL_RING | VAGINAL | 1 refills | Status: DC
  Filled 2023-05-20: qty 3, 84d supply, fill #0

## 2023-05-20 NOTE — Telephone Encounter (Signed)
Requested Prescriptions  Pending Prescriptions Disp Refills   etonogestrel-ethinyl estradiol (NUVARING) 0.12-0.015 MG/24HR vaginal ring 3 each 1    Sig: Insert vaginally and leave in place for 3 consecutive weeks, then remove for 1 week.     OB/GYN:  Contraceptives Failed - 05/20/2023  9:09 AM      Failed - Last BP in normal range    BP Readings from Last 1 Encounters:  04/01/23 (!) 140/86         Failed - Patient is not a smoker      Passed - Valid encounter within last 12 months    Recent Outpatient Visits           1 month ago Pap smear for cervical cancer screening   [place] [facility] Dept Of [place]. [facility] Jonah Blue B, MD   2 months ago Type 2 diabetes mellitus with hyperglycemia, unspecified whether long term insulin use (HCC)   [place] [facility] - [facility] Dept Of [place]. [facility], Marylene Land M, [place]   6 months ago Essential hypertension   [place] [facility] - [facility] Dept Of [place]. [facility] Eleonore Chiquito, FNP   1 year ago Anxiety and depression   [place] [facility] - A Dept Of [place]. [facility], [place] W, NP   1 year ago Type 2 diabetes mellitus with hyperglycemia, without long-term current use of insulin (HCC)   [place] [facility] - A Dept Of [place]. [facility] Claiborne Rigg, NP       Future Appointments             In 3 weeks Claiborne Rigg, NP [facility] - A Dept Of [place] Bride[place]. [facility]

## 2023-05-24 ENCOUNTER — Other Ambulatory Visit: Payer: Self-pay

## 2023-06-11 ENCOUNTER — Ambulatory Visit: Payer: Medicaid Other | Admitting: Nurse Practitioner

## 2023-06-28 ENCOUNTER — Other Ambulatory Visit: Payer: Self-pay | Admitting: Physician Assistant

## 2023-06-28 DIAGNOSIS — I10 Essential (primary) hypertension: Secondary | ICD-10-CM

## 2023-06-28 DIAGNOSIS — E1165 Type 2 diabetes mellitus with hyperglycemia: Secondary | ICD-10-CM

## 2023-06-29 ENCOUNTER — Other Ambulatory Visit: Payer: Self-pay

## 2023-06-29 ENCOUNTER — Other Ambulatory Visit (HOSPITAL_COMMUNITY): Payer: Self-pay

## 2023-06-29 MED ORDER — JANUMET 50-1000 MG PO TABS
1.0000 | ORAL_TABLET | Freq: Two times a day (BID) | ORAL | 0 refills | Status: DC
  Filled 2023-06-29: qty 180, 90d supply, fill #0

## 2023-06-29 MED ORDER — AMLODIPINE BESYLATE 10 MG PO TABS
10.0000 mg | ORAL_TABLET | Freq: Every day | ORAL | 0 refills | Status: DC
  Filled 2023-06-29: qty 90, 90d supply, fill #0

## 2023-06-29 NOTE — Telephone Encounter (Signed)
Requested medication (s) are due for refill today:yes  Requested medication (s) are on the active medication list: yes  Last refill:  norvasc- 03/03/23 #90 0 refills, janumet- 03/03/23 #180 0 refills  Future visit scheduled: yes in 1 month   Notes to clinic:  no refills remain. Do you want to refill for 1 month or #90?     Requested Prescriptions  Pending Prescriptions Disp Refills   amLODipine (NORVASC) 10 MG tablet 90 tablet 0    Sig: Take 1 tablet (10 mg total) by mouth daily.     Cardiovascular: Calcium Channel Blockers 2 Failed - 06/29/2023 10:57 AM      Failed - Last BP in normal range    BP Readings from Last 1 Encounters:  04/01/23 (!) 140/86         Passed - Last Heart Rate in normal range    Pulse Readings from Last 1 Encounters:  04/01/23 (!) 102         Passed - Valid encounter within last 6 months    Recent Outpatient Visits           2 months ago Pap smear for cervical cancer screening   [place] [facility] Dept Of [place]. [facility] Jonah Blue B, MD   3 months ago Type 2 diabetes mellitus with hyperglycemia, unspecified whether long term insulin use (HCC)   [place] [facility] - A Dept Of [place]. [facility], Marylene Land M, [place]   7 months ago Essential hypertension   [place] [facility] - A Dept Of [place]. [facility] Eleonore Chiquito, FNP   1 year ago Anxiety and depression   [place] [facility] A Dept Of [place]. [facility] [place], [place] W, NP   1 year ago Type 2 diabetes mellitus with hyperglycemia, without long-term current use of insulin (HCC)    [facility] - A Dept Of [place]. [facility] Claiborne Rigg, NP       Future Appointments             In 1 month Claiborne Rigg, NP [facility] - A Dept Of . [facility]             sitaGLIPtin-metformin (JANUMET)  50-1000 MG tablet 180 tablet 0    Sig: Take 1 tablet by mouth 2 (two) times daily with a meal.     Endocrinology:  Diabetes - Biguanide + DPP-4 Inhibitor Combos Failed - 06/29/2023 10:57 AM      Failed - B12 Level in normal range and within 720 days    No results found for: "VITAMINB12"       Passed - HBA1C is between 0 and 7.9 and within 180 days    HbA1c, POC (controlled diabetic range)  Date Value Ref Range Status  03/03/2023 7.4 (A) 0.0 - 7.0 % Final         Passed - Cr in normal range and within 360 days    Creatinine  Date Value Ref Range Status  08/23/2019 0.74 0.44 - 1.00 mg/dL Final   Creat  Date Value Ref Range Status  07/06/2016 0.71 0.50 - 1.10 mg/dL Final   Creatinine, Ser  Date Value Ref Range Status  01/25/2023 0.81 0.44 - 1.00 mg/dL Final   Creatinine, Urine  Date Value Ref Range Status  07/06/2016 122 20 -  320 mg/dL Final         Passed - eGFR in normal range and within 360 days    GFR, Est African American  Date Value Ref Range Status  07/06/2016 >89 >=60 mL/min Final   GFR, Est AFR Am  Date Value Ref Range Status  08/23/2019 >60 >60 mL/min Final   GFR calc Af Amer  Date Value Ref Range Status  02/28/2020 120 >59 mL/min/1.73 Final    Comment:    **[facility] currently reports eGFR in compliance with the current**   recommendations of the [employer]. [facility] will   update reporting as new guidelines are published from the NKF-ASN   Task force.    GFR, Est Non African American  Date Value Ref Range Status  07/06/2016 >89 >=60 mL/min Final   GFR, Estimated  Date Value Ref Range Status  01/25/2023 >60 >60 mL/min Final    Comment:    (NOTE) Calculated using the CKD-EPI Creatinine Equation (2021)   08/23/2019 >60 >60 mL/min Final   eGFR  Date Value Ref Range Status  08/11/2022 118 >59 mL/min/1.73 Final         Passed - Valid encounter within last 6 months    Recent Outpatient Visits           2 months ago Pap smear for  cervical cancer screening   [place] [facility] [place] [facility] Dept Of [place]. [facility] Jonah Blue B, MD   3 months ago Type 2 diabetes mellitus with hyperglycemia, unspecified whether long term insulin use (HCC)   [place] [facility] - A Dept Of [place]. [facility], Marylene Land M, [place]   7 months ago Essential hypertension   [place] [facility] [place] - A Dept Of [place]. [facility] Eleonore Chiquito, FNP   1 year ago Anxiety and depression   [place] [facility] - [facility] Dept Of [place]. [facility] [place], [place] W, NP   1 year ago Type 2 diabetes mellitus with hyperglycemia, without long-term current use of insulin (HCC)   [place] [facility] - A Dept Of [place]. [facility] Claiborne Rigg, NP       Future Appointments             In 1 month Claiborne Rigg, NP [facility] - A Dept Of [place]. [facility]            Passed - CBC within normal limits and completed in the last 12 months    WBC  Date Value Ref Range Status  01/25/2023 11.8 (H) 4.0 - 10.5 K/uL Final   RBC  Date Value Ref Range Status  01/25/2023 4.92 3.87 - 5.11 MIL/uL Final   Hemoglobin  Date Value Ref Range Status  01/25/2023 13.9 12.0 - 15.0 g/dL Final  16/03/9603 54.0 11.1 - 15.9 g/dL Final   HCT  Date Value Ref Range Status  01/25/2023 40.5 36.0 - 46.0 % Final   Hematocrit  Date Value Ref Range Status  08/11/2022 42.4 34.0 - 46.6 % Final   MCHC  Date Value Ref Range Status  01/25/2023 34.3 30.0 - 36.0 g/dL Final   [facility]  Date Value Ref Range Status  01/25/2023 28.3 26.0 - 34.0 pg Final   MCV  Date Value Ref Range Status  01/25/2023 82.3 80.0 - 100.0 fL Final  08/11/2022 87 79 - 97  fL Final   No results found for: "PLTCOUNTKUC", "LABPLAT", "POCPLA" RDW  Date Value Ref Range Status  01/25/2023 14.4 11.5 - 15.5 % Final  08/11/2022 14.2 11.7 - 15.4 %  Final

## 2023-06-30 ENCOUNTER — Other Ambulatory Visit (HOSPITAL_COMMUNITY): Payer: Self-pay

## 2023-06-30 ENCOUNTER — Other Ambulatory Visit: Payer: Self-pay

## 2023-07-01 ENCOUNTER — Other Ambulatory Visit: Payer: Self-pay | Admitting: Family Medicine

## 2023-07-01 DIAGNOSIS — I10 Essential (primary) hypertension: Secondary | ICD-10-CM

## 2023-07-01 DIAGNOSIS — E1165 Type 2 diabetes mellitus with hyperglycemia: Secondary | ICD-10-CM

## 2023-07-01 NOTE — Telephone Encounter (Signed)
Requested by interface surescripts. Last dispensed 06/30/23. Duplicate request. Too soon for refill. Requested Prescriptions  Refused Prescriptions Disp Refills   amLODipine (NORVASC) 10 MG tablet 90 tablet 0    Sig: Take 1 tablet (10 mg total) by mouth daily.     Cardiovascular: Calcium Channel Blockers 2 Failed - 07/01/2023 12:51 PM      Failed - Last BP in normal range    BP Readings from Last 1 Encounters:  04/01/23 (!) 140/86         Passed - Last Heart Rate in normal range    Pulse Readings from Last 1 Encounters:  04/01/23 (!) 102         Passed - Valid encounter within last 6 months    Recent Outpatient Visits           3 months ago Pap smear for cervical cancer screening   [facility] [place]. [facility] Jonah Blue B, MD   4 months ago Type 2 diabetes mellitus with hyperglycemia, unspecified whether long term insulin use (HCC)   [facility] [place]. [facility], Marylene Land M, [place]   7 months ago Essential hypertension   [facility] Dept Of [place]. [facility] Eleonore Chiquito, FNP   1 year ago Anxiety and depression   [facility] Dept Of [place]. [facility] [place], [place] W, NP   1 year ago Type 2 diabetes mellitus with hyperglycemia, without long-term current use of insulin (HCC)   [facility] - [facility] Dept Of [place]. [facility] Claiborne Rigg, NP       Future Appointments             In 1 month Claiborne Rigg, NP [facility] - A Dept [place]. [facility]             sitaGLIPtin-metformin (JANUMET) 50-1000 MG tablet 180 tablet 0    Sig: Take 1 tablet by mouth 2 (two) times daily with a meal.     Endocrinology:  Diabetes - Biguanide + DPP-4 Inhibitor Combos Failed - 07/01/2023 12:51 PM      Failed - B12 Level in normal range and  within 720 days    No results found for: "VITAMINB12"       Passed - HBA1C is between 0 and 7.9 and within 180 days    HbA1c, POC (controlled diabetic range)  Date Value Ref Range Status  03/03/2023 7.4 (A) 0.0 - 7.0 % Final         Passed - Cr in normal range and within 360 days    Creatinine  Date Value Ref Range Status  08/23/2019 0.74 0.44 - 1.00 mg/dL Final   Creat  Date Value Ref Range Status  07/06/2016 0.71 0.50 - 1.10 mg/dL Final   Creatinine, Ser  Date Value Ref Range Status  01/25/2023 0.81 0.44 - 1.00 mg/dL Final   Creatinine, Urine  Date Value Ref Range Status  07/06/2016 122 20 - 320 mg/dL Final         Passed - eGFR in normal range and within 360 days    GFR, Est African American  Date Value Ref Range Status  07/06/2016 >89 >=60 mL/min Final   GFR, Est AFR Am  Date Value Ref Range  Status  08/23/2019 >60 >60 mL/min Final   GFR calc Af Amer  Date Value Ref Range Status  02/28/2020 120 >59 mL/min/1.73 Final    Comment:    **[facility] currently reports eGFR in compliance with the current**   recommendations of the [employer]. [facility] will   update reporting as new guidelines are published from the NKF-ASN   Task force.    GFR, Est Non African American  Date Value Ref Range Status  07/06/2016 >89 >=60 mL/min Final   GFR, Estimated  Date Value Ref Range Status  01/25/2023 >60 >60 mL/min Final    Comment:    (NOTE) Calculated using the CKD-EPI Creatinine Equation (2021)   08/23/2019 >60 >60 mL/min Final   eGFR  Date Value Ref Range Status  08/11/2022 118 >59 mL/min/1.73 Final         Passed - Valid encounter within last 6 months    Recent Outpatient Visits           3 months ago Pap smear for cervical cancer screening   [facility] Of [place]. [facility] Jonah Blue B, MD   4 months ago Type 2 diabetes mellitus with hyperglycemia, unspecified whether long term insulin use (HCC)    [facility] Dept Of [place]. [facility] [address], Marylene Land M, [place]   7 months ago Essential hypertension   [facility] [place]. [facility] Eleonore Chiquito, FNP   1 year ago Anxiety and depression   [facility] Dept Of [place]. [facility] [address] [place], [place] W, NP   1 year ago Type 2 diabetes mellitus with hyperglycemia, without long-term current use of insulin (HCC)   [facility] - [facility] Dept Of [place]. [facility] Claiborne Rigg, NP       Future Appointments             In 1 month Claiborne Rigg, NP [facility] - [facility] Dept Of [place]. [facility]            Passed - CBC within normal limits and completed in the last 12 months    WBC  Date Value Ref Range Status  01/25/2023 11.8 (H) 4.0 - 10.5 K/uL Final   RBC  Date Value Ref Range Status  01/25/2023 4.92 3.87 - 5.11 MIL/uL Final   Hemoglobin  Date Value Ref Range Status  01/25/2023 13.9 12.0 - 15.0 g/dL Final  41/32/4401 02.7 11.1 - 15.9 g/dL Final   HCT  Date Value Ref Range Status  01/25/2023 40.5 36.0 - 46.0 % Final   Hematocrit  Date Value Ref Range Status  08/11/2022 42.4 34.0 - 46.6 % Final   MCHC  Date Value Ref Range Status  01/25/2023 34.3 30.0 - 36.0 g/dL Final   [facility]  Date Value Ref Range Status  01/25/2023 28.3 26.0 - 34.0 pg Final   MCV  Date Value Ref Range Status  01/25/2023 82.3 80.0 - 100.0 fL Final  08/11/2022 87 79 - 97 fL Final   No results found for: "PLTCOUNTKUC", "LABPLAT", "POCPLA" RDW  Date Value Ref Range Status  01/25/2023 14.4 11.5 - 15.5 % Final  08/11/2022 14.2 11.7 - 15.4 % Final

## 2023-07-05 ENCOUNTER — Telehealth: Payer: Self-pay

## 2023-07-05 NOTE — Telephone Encounter (Signed)
Call to patient unable to reach . VM left to call office to discuss

## 2023-07-05 NOTE — Telephone Encounter (Signed)
Spoke with patient. Patient concerned of pregnancy. Patient voiced that she had unprotected sex in January and has not had her cycle. She states she has taken an pregnancy test and it was negative. She voiced  she purchased at the [employer] . She was unable to tell me the expiration date of the test. Advised that testing maybe too early and or the test she purchased my not be valid. Advised that she come in the office for pregnancy test. Patient declined. Voiced that she wanted to wait another week or so before she test again. Advised that if needed prenatal care should start as soon as possible. Patient voiced that she wanted to think about something before testing again. Patient thanked me for speaking with her and she would call us back when she ready to come in for a test.

## 2023-07-05 NOTE — Telephone Encounter (Signed)
Copied from CRM [id number]. Topic: Clinical - Medical Advice >> Jul 05, 2023 11:21 AM Shelah Lewandowsky wrote: Reason for CRM: Have questions, removed Nuvaring in Dec and did not have a menstrual cycle in January, please call [phone]

## 2023-07-14 ENCOUNTER — Telehealth: Payer: Self-pay

## 2023-07-14 ENCOUNTER — Other Ambulatory Visit: Payer: Self-pay

## 2023-07-14 NOTE — Telephone Encounter (Signed)
Follow-up. Spoke with patient as follow-up to our conversation on 07/05/2023. Patient scheduled for appointment on 07/15/2023.

## 2023-07-15 ENCOUNTER — Encounter: Payer: Self-pay | Admitting: Physician Assistant

## 2023-07-15 ENCOUNTER — Other Ambulatory Visit: Payer: Self-pay | Admitting: Physician Assistant

## 2023-07-15 ENCOUNTER — Ambulatory Visit: Payer: Medicaid Other | Attending: Physician Assistant | Admitting: Physician Assistant

## 2023-07-15 ENCOUNTER — Ambulatory Visit: Payer: Medicaid Other

## 2023-07-15 VITALS — BP 138/90 | HR 103 | Wt 177.6 lb

## 2023-07-15 DIAGNOSIS — Z3202 Encounter for pregnancy test, result negative: Secondary | ICD-10-CM | POA: Diagnosis not present

## 2023-07-15 DIAGNOSIS — N926 Irregular menstruation, unspecified: Secondary | ICD-10-CM

## 2023-07-15 DIAGNOSIS — E1165 Type 2 diabetes mellitus with hyperglycemia: Secondary | ICD-10-CM | POA: Diagnosis not present

## 2023-07-15 DIAGNOSIS — N912 Amenorrhea, unspecified: Secondary | ICD-10-CM | POA: Diagnosis not present

## 2023-07-15 LAB — POCT URINE PREGNANCY: Preg Test, Ur: NEGATIVE

## 2023-07-15 LAB — GLUCOSE, POCT (MANUAL RESULT ENTRY): POC Glucose: 174 mg/dL — AB (ref 70–99)

## 2023-07-15 NOTE — Addendum Note (Signed)
Addended by: Dalene Carrow I on: 07/15/2023 09:20 AM   Modules accepted: Orders

## 2023-07-15 NOTE — Progress Notes (Signed)
Patient ID: Samantha [name], female   DOB: 1980/08/31, 43 y.o.   MRN: [id number]   Armonii Sieh, is a 43 y.o. female  [id number]  [id number]  DOB - 27-May-1981  Chief Complaint  Patient presents with   Amenorrhea       Subjective:   Samantha [name] is a 43 y.o. female here today for getting off track with nuvaring and had sex a few times in December and January.  Urine preg at home and here are neg.  She wants blood test.  Periods not normal since but has not resumed nuva ring.  Had a period in December.  No menopausal symptoms.  She does feel as though she may have PMS.     No problems updated.  ALLERGIES: No Known Allergies  PAST MEDICAL HISTORY: Past Medical History:  Diagnosis Date   Diabetes mellitus without complication (HCC)    Hyperlipidemia    Hypertension     MEDICATIONS AT HOME: Prior to Admission medications   Medication Sig Start Date End Date Taking? Authorizing Provider  Accu-Chek Softclix Lancets lancets Use up to four times daily as directed 07/03/22  Yes Rodolph Bong, MD  albuterol (VENTOLIN HFA) 108 (90 Base) MCG/ACT inhaler Inhale 1-2 puffs into the lungs every 6 (six) hours as needed for wheezing or shortness of breath. 03/03/23  Yes Georgian Co M, PA-C  amLODipine (NORVASC) 10 MG tablet Take 1 tablet (10 mg total) by mouth daily. 06/29/23  Yes Hoy Register, MD  blood glucose meter kit and supplies Use up to four times daily as directed 07/03/22  Yes Rodolph Bong, MD  Blood Pressure Monitor DEVI Please provide patient with insurance approved blood pressure monitor 12/24/18  Yes Claiborne Rigg, NP  etonogestrel-ethinyl estradiol (NUVARING) 0.12-0.015 MG/24HR vaginal ring Insert vaginally and leave in place for 3 consecutive weeks, then remove for 1 week. 05/20/23  Yes Claiborne Rigg, NP  fluconazole (DIFLUCAN) 150 MG tablet Take 1 tablet (150 mg total) by mouth every 3 (three) days. 05/06/23  Yes Claiborne Rigg, NP  glucose blood  test strip Use up to four times daily as directed 07/03/22  Yes Rodolph Bong, MD  insulin isophane & regular human KwikPen (HUMULIN 70/30 KWIKPEN) (70-30) 100 UNIT/ML KwikPen Inject 14 Units into the skin 2 (two) times daily. 03/03/23  Yes Georgian Co M, PA-C  Insulin Pen Needle (PEN NEEDLES 3/16") 31G X 5 MM MISC Use 2 times a day as directed 03/03/23  Yes Lanitra Battaglini M, PA-C  lisinopril (ZESTRIL) 20 MG tablet Take 1 tablet (20 mg total) by mouth daily. (Must have office visit for refills) 03/03/23  Yes Kashari Chalmers, Marzella Schlein, PA-C  metroNIDAZOLE (FLAGYL) 500 MG tablet Take 1 tablet (500 mg total) by mouth 2 (two) times daily. 04/03/23  Yes Marcine Matar, MD  Misc. Devices MISC Please provide patient with insurance approved blood pressure monitor 12/24/18  Yes Claiborne Rigg, NP  ondansetron (ZOFRAN) 4 MG tablet Take 1 tablet (4 mg total) by mouth every 8 (eight) hours as needed for nausea or vomiting. 03/03/23  Yes Georgian Co M, PA-C  pantoprazole (PROTONIX) 40 MG tablet Take 1 tablet (40 mg total) by mouth daily. 03/03/23  Yes Georgian Co M, PA-C  rosuvastatin (CRESTOR) 20 MG tablet Take 1 tablet (20 mg total) by mouth daily. 03/03/23  Yes Georgian Co M, PA-C  sitaGLIPtin-metformin (JANUMET) 50-1000 MG tablet Take 1 tablet by mouth 2 (two) times daily with a meal. 06/29/23  Yes  Hoy Register, MD    ROS: Neg HEENT Neg resp Neg cardiac Neg GI Neg GU Neg MS Neg psych Neg neuro  Objective:   Vitals:   07/15/23 0839 07/15/23 0904  BP: (!) 157/113 (!) 138/90  Pulse: (!) 103   SpO2: 100%   Weight: 177 lb 9.6 oz (80.6 kg)    Exam General appearance : Awake, alert, not in any distress. Speech Clear. Not toxic looking HEENT: Atraumatic and Normocephalic Neck: Supple, no JVD. No cervical lymphadenopathy.  Chest: Good air entry bilaterally, CTAB.  No rales/rhonchi/wheezing CVS: S1 S2 regular, no murmurs.  Abdomen: Bowel sounds present, Non tender and not distended with  no gaurding, rigidity or rebound. Extremities: B/L Lower Ext shows no edema, both legs are warm to touch Neurology: Awake alert, and oriented X 3, CN II-XII intact, Non focal Skin: No Rash  Data Review Lab Results  Component Value Date   HGBA1C 7.4 (A) 03/03/2023   HGBA1C 9.7 (H) 07/01/2022   HGBA1C 7.2 (A) 01/30/2022    Assessment & Plan   1. Type 2 diabetes mellitus with hyperglycemia, unspecified whether long term insulin use (HCC) (Primary) Has A1c appt next month with PCP - Glucose (CBG)  2. Amenorrhea She may resume nuvaring if preg is negative.  Use back up protection for 1 month after resuming BC - POCT urine pregnancy - hCG, quantitative, pregnancy - TSH  3. Irregular periods Mom menopause age >62 - hCG, quantitative, pregnancy - TSH She did not take BP meds this morning   Return for keep appt with PCP in March.  The patient was given clear instructions to go to ER or return to medical center if symptoms don't improve, worsen or new problems develop. The patient verbalized understanding. The patient was told to call to get lab results if they haven't heard anything in the next week.      Georgian Co, PA-C [facility] [place], [place] [phone]   07/15/2023, 9:08 AM

## 2023-07-16 ENCOUNTER — Telehealth: Payer: Self-pay

## 2023-07-16 LAB — BETA HCG QUANT (REF LAB): hCG Quant: <1 m[IU]/mL

## 2023-07-16 LAB — TSH: TSH: 3.5 u[IU]/mL (ref 0.450–4.500)

## 2023-07-16 NOTE — Telephone Encounter (Signed)
Pt was called and vm was left, Information has been sent to nurse pool.

## 2023-07-16 NOTE — Telephone Encounter (Signed)
-----   Message from Georgian Co sent at 07/16/2023 12:08 PM EST ----- Please call patient. Blood pregnancy was negative.  Thanks, Georgian Co

## 2023-07-16 NOTE — Telephone Encounter (Signed)
VM left informing patient to check mychart as her TSH results has been uploaded.   Copied from CRM [id number]. Topic: Clinical - Lab/Test Results >> Jul 16, 2023  3:50 PM Patsy Lager T wrote: Reason for CRM: advised patient of the lab results but she also wants results from her TSH test. Please f/u with patient

## 2023-07-16 NOTE — Telephone Encounter (Signed)
-----   Message from Georgian Co sent at 07/16/2023 12:13 PM EST ----- Thyroid was normal and negative pregnacy. Thanks,  Georgian Co

## 2023-07-27 ENCOUNTER — Ambulatory Visit: Payer: Medicaid Other | Attending: Nurse Practitioner

## 2023-07-27 DIAGNOSIS — Z111 Encounter for screening for respiratory tuberculosis: Secondary | ICD-10-CM

## 2023-07-28 ENCOUNTER — Encounter: Payer: Self-pay | Admitting: Physician Assistant

## 2023-07-29 ENCOUNTER — Ambulatory Visit: Payer: Medicaid Other | Attending: Nurse Practitioner

## 2023-07-29 DIAGNOSIS — Z111 Encounter for screening for respiratory tuberculosis: Secondary | ICD-10-CM

## 2023-07-29 LAB — TB SKIN TEST
Induration: 0 mm
TB Skin Test: NEGATIVE

## 2023-07-29 NOTE — Progress Notes (Signed)
 Patient arrived for PPD reading PPD reading: negative Patient form has been completed by Dr. Alvis Lemmings

## 2023-07-29 NOTE — Telephone Encounter (Signed)
 Vision and hearing can be done with a nurse visit as well as the immunization information completion. She can leave the form after the nurse visit for signature.

## 2023-07-30 NOTE — Telephone Encounter (Signed)
 Patient identified by name and date of birth.  Patient stated that the form does not need to be filled out again. TB was all she needed at the moment.

## 2023-08-10 ENCOUNTER — Other Ambulatory Visit (HOSPITAL_COMMUNITY): Payer: Self-pay

## 2023-08-11 ENCOUNTER — Ambulatory Visit: Payer: Medicaid Other | Admitting: Nurse Practitioner

## 2023-08-31 ENCOUNTER — Ambulatory Visit: Admitting: Nurse Practitioner

## 2023-09-07 ENCOUNTER — Ambulatory Visit: Admitting: Podiatry

## 2023-09-16 ENCOUNTER — Ambulatory Visit: Admitting: Podiatry

## 2023-09-27 ENCOUNTER — Other Ambulatory Visit: Payer: Self-pay | Admitting: Physician Assistant

## 2023-09-27 ENCOUNTER — Other Ambulatory Visit: Payer: Self-pay | Admitting: Family Medicine

## 2023-09-27 DIAGNOSIS — I10 Essential (primary) hypertension: Secondary | ICD-10-CM

## 2023-09-27 DIAGNOSIS — E1165 Type 2 diabetes mellitus with hyperglycemia: Secondary | ICD-10-CM

## 2023-09-27 DIAGNOSIS — E1169 Type 2 diabetes mellitus with other specified complication: Secondary | ICD-10-CM

## 2023-09-28 ENCOUNTER — Other Ambulatory Visit (HOSPITAL_COMMUNITY): Payer: Self-pay

## 2023-09-28 ENCOUNTER — Other Ambulatory Visit: Payer: Self-pay

## 2023-09-29 ENCOUNTER — Other Ambulatory Visit: Payer: Self-pay

## 2023-09-29 MED ORDER — LISINOPRIL 20 MG PO TABS
20.0000 mg | ORAL_TABLET | Freq: Every day | ORAL | 0 refills | Status: DC
  Filled 2023-09-29: qty 90, 90d supply, fill #0

## 2023-09-29 MED ORDER — JANUMET 50-1000 MG PO TABS
1.0000 | ORAL_TABLET | Freq: Two times a day (BID) | ORAL | 0 refills | Status: DC
  Filled 2023-09-29: qty 180, 90d supply, fill #0

## 2023-09-29 MED ORDER — ROSUVASTATIN CALCIUM 20 MG PO TABS
20.0000 mg | ORAL_TABLET | Freq: Every day | ORAL | 0 refills | Status: DC
  Filled 2023-09-29: qty 90, 90d supply, fill #0

## 2023-09-29 MED ORDER — AMLODIPINE BESYLATE 10 MG PO TABS
10.0000 mg | ORAL_TABLET | Freq: Every day | ORAL | 0 refills | Status: DC
  Filled 2023-09-29: qty 90, 90d supply, fill #0

## 2023-09-29 NOTE — Telephone Encounter (Signed)
 Please call Ms. Heiss and ask if she is still wanting to be seen by me or assigned to a different provider here at the clinic?

## 2023-09-30 ENCOUNTER — Ambulatory Visit (INDEPENDENT_AMBULATORY_CARE_PROVIDER_SITE_OTHER): Admitting: Podiatry

## 2023-09-30 DIAGNOSIS — Z91199 Patient's noncompliance with other medical treatment and regimen due to unspecified reason: Secondary | ICD-10-CM

## 2023-09-30 NOTE — Progress Notes (Signed)
 Patient was no-show for appointment today

## 2023-09-30 NOTE — Telephone Encounter (Signed)
 Patient identified by name and date of birth.  Patient stated that she does not care who she sees in our office and her reason for no showing appointments is because she did not want to come to them. Patient also asked if we had any locations near [facility] because she would rather go there. I did inform her of her insurance and stated that there are offices in her area that will accept her as a new patient if they are taking new patients.  Patient voiced understanding.

## 2023-12-23 ENCOUNTER — Encounter: Payer: Self-pay | Admitting: Physician Assistant

## 2023-12-23 ENCOUNTER — Other Ambulatory Visit: Payer: Self-pay

## 2023-12-23 ENCOUNTER — Other Ambulatory Visit (HOSPITAL_COMMUNITY): Payer: Self-pay

## 2023-12-23 ENCOUNTER — Ambulatory Visit: Admitting: Physician Assistant

## 2023-12-23 ENCOUNTER — Other Ambulatory Visit (HOSPITAL_COMMUNITY)
Admission: RE | Admit: 2023-12-23 | Discharge: 2023-12-23 | Disposition: A | Discharge status: Home / Self Care | Source: Ambulatory Visit | Attending: Physician Assistant | Admitting: Physician Assistant

## 2023-12-23 VITALS — BP 144/94 | HR 86 | Ht 62.0 in | Wt 170.0 lb

## 2023-12-23 DIAGNOSIS — I10 Essential (primary) hypertension: Secondary | ICD-10-CM | POA: Diagnosis not present

## 2023-12-23 DIAGNOSIS — E119 Type 2 diabetes mellitus without complications: Secondary | ICD-10-CM

## 2023-12-23 DIAGNOSIS — N898 Other specified noninflammatory disorders of vagina: Secondary | ICD-10-CM | POA: Insufficient documentation

## 2023-12-23 DIAGNOSIS — Z113 Encounter for screening for infections with a predominantly sexual mode of transmission: Secondary | ICD-10-CM

## 2023-12-23 DIAGNOSIS — B9689 Other specified bacterial agents as the cause of diseases classified elsewhere: Secondary | ICD-10-CM | POA: Diagnosis not present

## 2023-12-23 DIAGNOSIS — E1165 Type 2 diabetes mellitus with hyperglycemia: Secondary | ICD-10-CM | POA: Diagnosis not present

## 2023-12-23 DIAGNOSIS — E785 Hyperlipidemia, unspecified: Secondary | ICD-10-CM

## 2023-12-23 DIAGNOSIS — E1169 Type 2 diabetes mellitus with other specified complication: Secondary | ICD-10-CM

## 2023-12-23 LAB — POCT GLYCOSYLATED HEMOGLOBIN (HGB A1C): Hemoglobin A1C: 7 % — AB (ref 4.0–5.6)

## 2023-12-23 MED ORDER — METRONIDAZOLE 500 MG PO TABS: 500.0000 mg | ORAL_TABLET | Freq: Two times a day (BID) | ORAL | 0 refills | Status: AC

## 2023-12-23 MED ORDER — AMLODIPINE BESYLATE 10 MG PO TABS
10.0000 mg | ORAL_TABLET | Freq: Every day | ORAL | 0 refills | Status: DC
  Filled 2023-12-23 (×2): qty 90, 90d supply, fill #0

## 2023-12-23 MED ORDER — ROSUVASTATIN CALCIUM 20 MG PO TABS
20.0000 mg | ORAL_TABLET | Freq: Every day | ORAL | 0 refills | Status: DC
  Filled 2023-12-23 (×2): qty 90, 90d supply, fill #0

## 2023-12-23 MED ORDER — JANUMET 50-1000 MG PO TABS
1.0000 | ORAL_TABLET | Freq: Two times a day (BID) | ORAL | 0 refills | Status: DC
Start: 2023-12-23 — End: 2024-03-22
  Filled 2023-12-23 (×2): qty 180, 90d supply, fill #0

## 2023-12-23 MED ORDER — LISINOPRIL 20 MG PO TABS
20.0000 mg | ORAL_TABLET | Freq: Every day | ORAL | 0 refills | Status: DC
  Filled 2023-12-23 (×2): qty 90, 90d supply, fill #0

## 2023-12-23 NOTE — Patient Instructions (Addendum)
 VISIT SUMMARY:  Today, you came in for an STD screening due to experiencing a grayish discharge for the past week. We discussed your current health concerns, including your diabetes management, blood pressure, and cholesterol levels. We also talked about the importance of regular health screenings and follow-up appointments.  YOUR PLAN:  -VAGINAL DISCHARGE: You have been experiencing an increased grayish discharge without odor, itching, or burning. This could be due to bacterial vaginosis, trichomoniasis, gonorrhea, or a yeast infection. We have prescribed metronidazole  to start treatment and ordered a vaginal swab to identify the exact cause.  -TYPE 2 DIABETES MELLITUS: You have not been monitoring your blood sugar levels or taking insulin . Type 2 diabetes is a condition where your body does not use insulin  properly, leading to high blood sugar levels. We will check your A1c level, kidney and liver function, and urine microalbumin. We encourage you to find a primary care provider in [facility] to help manage your diabetes.  -HYPERTENSION: Your blood pressure was slightly elevated today at 144/94 mmHg. Hypertension is high blood pressure, which can lead to serious health issues if not managed. Continue taking your current medications, lisinopril  and amlodipine , and monitor your blood pressure at home.  -HYPERLIPIDEMIA: You are taking your cholesterol medication as prescribed. Hyperlipidemia is having high levels of cholesterol in your blood, which can increase the risk of heart disease. We will check your cholesterol levels to ensure they are within a healthy range.  -GENERAL HEALTH MAINTENANCE: You are not up to date on your mammogram screenings. Regular mammograms are important for early detection of breast cancer. Please schedule a mammogram through MyChart.  Health Maintenance, Female Adopting a healthy lifestyle and getting preventive care are important in promoting health and wellness. Ask  your health care provider about: The right schedule for you to have regular tests and exams. Things you can do on your own to prevent diseases and keep yourself healthy. What should I know about diet, weight, and exercise? Eat a healthy diet  Eat a diet that includes plenty of vegetables, fruits, low-fat dairy products, and lean protein. Do not eat a lot of foods that are high in solid fats, added sugars, or sodium. Maintain a healthy weight Body mass index (BMI) is used to identify weight problems. It estimates body fat based on height and weight. Your health care provider can help determine your BMI and help you achieve or maintain a healthy weight. Get regular exercise Get regular exercise. This is one of the most important things you can do for your health. Most adults should: Exercise for at least 150 minutes each week. The exercise should increase your heart rate and make you sweat (moderate-intensity exercise). Do strengthening exercises at least twice a week. This is in addition to the moderate-intensity exercise. Spend less time sitting. Even light physical activity can be beneficial. Watch cholesterol and blood lipids Have your blood tested for lipids and cholesterol at 43 years of age, then have this test every 5 years. Have your cholesterol levels checked more often if: Your lipid or cholesterol levels are high. You are older than 43 years of age. You are at high risk for heart disease. What should I know about cancer screening? Depending on your health history and family history, you may need to have cancer screening at various ages. This may include screening for: Breast cancer. Cervical cancer. Colorectal cancer. Skin cancer. Lung cancer. What should I know about heart disease, diabetes, and high blood pressure? Blood pressure and heart  disease High blood pressure causes heart disease and increases the risk of stroke. This is more likely to develop in people who have high  blood pressure readings or are overweight. Have your blood pressure checked: Every 3-5 years if you are 61-28 years of age. Every year if you are 66 years old or older. Diabetes Have regular diabetes screenings. This checks your fasting blood sugar level. Have the screening done: Once every three years after age 44 if you are at a normal weight and have a low risk for diabetes. More often and at a younger age if you are overweight or have a high risk for diabetes. What should I know about preventing infection? Hepatitis B If you have a higher risk for hepatitis B, you should be screened for this virus. Talk with your health care provider to find out if you are at risk for hepatitis B infection. Hepatitis C Testing is recommended for: Everyone born from [birth year] through 1965. Anyone with known risk factors for hepatitis C. Sexually transmitted infections (STIs) Get screened for STIs, including gonorrhea and chlamydia, if: You are sexually active and are younger than 43 years of age. You are older than 43 years of age and your health care provider tells you that you are at risk for this type of infection. Your sexual activity has changed since you were last screened, and you are at increased risk for chlamydia or gonorrhea. Ask your health care provider if you are at risk. Ask your health care provider about whether you are at high risk for HIV. Your health care provider may recommend a prescription medicine to help prevent HIV infection. If you choose to take medicine to prevent HIV, you should first get tested for HIV. You should then be tested every 3 months for as long as you are taking the medicine. Pregnancy If you are about to stop having your period (premenopausal) and you may become pregnant, seek counseling before you get pregnant. Take 400 to 800 micrograms (mcg) of folic acid every day if you become pregnant. Ask for birth control (contraception) if you want to prevent  pregnancy. Osteoporosis and menopause Osteoporosis is a disease in which the bones lose minerals and strength with aging. This can result in bone fractures. If you are 79 years old or older, or if you are at risk for osteoporosis and fractures, ask your health care provider if you should: Be screened for bone loss. Take a calcium  or vitamin D  supplement to lower your risk of fractures. Be given hormone replacement therapy (HRT) to treat symptoms of menopause. Follow these instructions at home: Alcohol use Do not drink alcohol if: Your health care provider tells you not to drink. You are pregnant, may be pregnant, or are planning to become pregnant. If you drink alcohol: Limit how much you have to: 0-1 drink a day. Know how much alcohol is in your drink. In the [place] one drink equals one 12 oz bottle of beer (355 mL), one 5 oz glass of wine (148 mL), or one 1 oz glass of hard liquor (44 mL). Lifestyle Do not use any products that contain nicotine  or tobacco. These products include cigarettes, chewing tobacco, and vaping devices, such as e-cigarettes. If you need help quitting, ask your health care provider. Do not use street drugs. Do not share needles. Ask your health care provider for help if you need support or information about quitting drugs. General instructions Schedule regular health, dental, and eye exams. Stay current with your  vaccines. Tell your health care provider if: You often feel depressed. You have ever been abused or do not feel safe at home. Summary Adopting a healthy lifestyle and getting preventive care are important in promoting health and wellness. Follow your health care provider's instructions about healthy diet, exercising, and getting tested or screened for diseases. Follow your health care provider's instructions on monitoring your cholesterol and blood pressure. This information is not intended to replace advice given to you by your health care provider.  Make sure you discuss any questions you have with your health care provider. Document Revised: 10/07/2020 Document Reviewed: 10/07/2020 Elsevier Patient Education  2024 [employer].

## 2023-12-23 NOTE — Progress Notes (Signed)
 Established Patient Office Visit  Subjective   Patient ID: Samantha [name], female    DOB: 1981/05/15  Age: 43 y.o. MRN: [id number]  Chief Complaint  Patient presents with   Vaginal Discharge    Patient noticed a vaginal discharge x1 week ago. She denies any dysuria.   She desires std screening    Discussed the use of AI scribe software for clinical note transcription with the patient, who gave verbal consent to proceed.  History of Present Illness  Samantha [name] is a 43 year old female who presents for STD screening due to recent discharge.  She has experienced a grayish discharge for the past week, increasing in volume without odor. She denies recent sexual intercourse, having not engaged in intercourse for about three months with only one partner. She reports no itching, burning, sores, abdominal pain, nausea, or vomiting.  She manages her diabetes independently, taking Janumet  but has stopped insulin  due to lack of motivation and discomfort with injections. She is not monitoring her blood glucose levels at home. She takes lisinopril  and amlodipine  regularly for cholesterol and hypertension, and can feel when her blood pressure rises but does not check her BP at home.       Past Medical History:  Diagnosis Date   Diabetes mellitus without complication (HCC)    Hyperlipidemia    Hypertension    Social History   Socioeconomic History   Marital status: Single    Spouse name: Not on file   Number of children: Not on file   Years of education: Not on file   Highest education level: Not on file  Occupational History   Not on file  Tobacco Use   Smoking status: Every Day    Current packs/day: 0.50    Average packs/day: 0.5 packs/day for 0.4 years (0.2 ttl pk-yrs)    Types: Cigarettes   Smokeless tobacco: Never  Vaping Use   Vaping status: Never Used  Substance and Sexual Activity   Alcohol use: No   Drug use: No   Sexual activity: Yes    Birth  control/protection: None  Other Topics Concern   Not on file  Social History Narrative   Not on file   Social Drivers of Health   Financial Resource Strain: Medium Risk (04/01/2023)   Overall Financial Resource Strain (CARDIA)    Difficulty of Paying Living Expenses: Somewhat hard  Food Insecurity: Food Insecurity Present (04/01/2023)   Hunger Vital Sign    Worried About Running Out of Food in the Last Year: Sometimes true    Ran Out of Food in the Last Year: Sometimes true  Transportation Needs: No Transportation Needs (04/01/2023)   PRAPARE - Administrator, Civil Service (Medical): No    Lack of Transportation (Non-Medical): No  Physical Activity: Inactive (04/01/2023)   Exercise Vital Sign    Days of Exercise per Week: 0 days    Minutes of Exercise per Session: 0 min  Stress: Stress Concern Present (04/01/2023)   [employer] of Occupational Health - Occupational Stress Questionnaire    Feeling of Stress : Rather much  Social Connections: Moderately Isolated (04/01/2023)   Social Connection and Isolation Panel    Frequency of Communication with Friends and Family: Once a week    Frequency of Social Gatherings with Friends and Family: Never    Attends Religious Services: More than 4 times per year    Active Member of [employer] or Organizations: Yes    Attends Banker  Meetings: Never    Marital Status: Divorced  Catering manager Violence: Not At Risk (04/01/2023)   Humiliation, Afraid, Rape, and Kick questionnaire    Fear of Current or Ex-Partner: No    Emotionally Abused: No    Physically Abused: No    Sexually Abused: No   Family History  Problem Relation Age of Onset   Heart disease Mother    Lung cancer Father    Diabetes type II Other    No Known Allergies  Review of Systems  Constitutional:  Negative for chills and fever.  HENT: Negative.    Eyes: Negative.   Respiratory:  Negative for shortness of breath.   Cardiovascular:   Negative for chest pain.  Gastrointestinal:  Negative for abdominal pain, nausea and vomiting.  Genitourinary:  Negative for dysuria, frequency and hematuria.  Musculoskeletal: Negative.   Skin: Negative.   Neurological: Negative.   Endo/Heme/Allergies: Negative.   Psychiatric/Behavioral: Negative.        Objective:     BP (!) 144/94 (BP Location: Left Arm, Patient Position: Sitting, Cuff Size: Large)   Pulse 86   Ht 5' 2 (1.575 m)   Wt 170 lb (77.1 kg)   LMP 12/09/2023   SpO2 99%   BMI 31.09 kg/m  BP Readings from Last 3 Encounters:  12/23/23 (!) 144/94  07/15/23 (!) 138/90  04/01/23 (!) 140/86   Wt Readings from Last 3 Encounters:  12/23/23 170 lb (77.1 kg)  07/15/23 177 lb 9.6 oz (80.6 kg)  04/01/23 185 lb (83.9 kg)    Physical Exam Vitals and nursing note reviewed.    GENERAL: Alert, cooperative, well developed, no acute distress. HEENT: Normocephalic, normal oropharynx, moist mucous membranes. CHEST: Clear to auscultation bilaterally, no wheezes, rhonchi, or crackles. CARDIOVASCULAR: Normal heart rate and rhythm, S1 and S2 normal without murmurs. ABDOMEN: Soft, non-tender, non-distended, without organomegaly, normal bowel sounds. EXTREMITIES: No cyanosis or edema. NEUROLOGICAL: Cranial nerves grossly intact, moves all extremities without gross motor or sensory deficit.    Assessment & Plan:   Problem List Items Addressed This Visit       Cardiovascular and Mediastinum   Essential hypertension - Primary   Relevant Medications   amLODipine  (NORVASC ) 10 MG tablet   lisinopril  (ZESTRIL ) 20 MG tablet   rosuvastatin  (CRESTOR ) 20 MG tablet     Endocrine   Type 2 diabetes mellitus with hyperglycemia (HCC)   Relevant Medications   lisinopril  (ZESTRIL ) 20 MG tablet   rosuvastatin  (CRESTOR ) 20 MG tablet   sitaGLIPtin -metformin  (JANUMET ) 50-1000 MG tablet   Other Relevant Orders   CBC with Differential/Platelet   Comp. Metabolic Panel (12)   Microalbumin /  creatinine urine ratio   POCT glycosylated hemoglobin (Hb A1C) (Completed)   Hyperlipidemia associated with type 2 diabetes mellitus (HCC)   Relevant Medications   amLODipine  (NORVASC ) 10 MG tablet   lisinopril  (ZESTRIL ) 20 MG tablet   rosuvastatin  (CRESTOR ) 20 MG tablet   sitaGLIPtin -metformin  (JANUMET ) 50-1000 MG tablet   Other Relevant Orders   Lipid panel   Other Visit Diagnoses       Screen for STD (sexually transmitted disease)       Relevant Orders   RPR   HIV antibody (with reflex)   Cervicovaginal ancillary only     Bacterial vaginitis       Relevant Medications   metroNIDAZOLE  (FLAGYL ) 500 MG tablet     Assessment and Plan Vaginal Discharge Increased grayish discharge without odor, itching, or burning. Differential includes bacterial vaginosis, trichomoniasis,  gonorrhea, and yeast infection. Initiated metronidazole  to prevent management delay. - Order vaginal swab for gonorrhea, trichomoniasis, bacterial vaginosis, and yeast infection. - Prescribe metronidazole .  Type 2 Diabetes Mellitus Has not been monitoring blood sugars or taking insulin . Social anxiety affects healthcare access. Agreed to check A1c and labs. - Check A1c level. A1C 7.0 - Perform kidney and liver function tests. - Perform urine microalbumin test. - Encourage finding a primary care provider in [facility] or return to [facility] for follow up in 3 months   Hypertension Blood pressure slightly elevated at 144/94 mmHg. Continues lisinopril  and amlodipine . - Continue current antihypertensive medications. - Encourage home blood pressure monitoring.  Hyperlipidemia Adheres to cholesterol medication. Agreed to check cholesterol levels. - Check cholesterol levels.  General Health Maintenance Not up to date on mammogram screenings. Discussed importance and scheduling through MyChart. - Encourage scheduling a mammogram through MyChart.  Follow-up Needs follow-up for diabetes management and lab results.  Agreed to use MyChart for appointments. - Send lab results through MyChart and follow up if not viewed. - Schedule follow-up in three months for diabetes management and A1c reassessment. - Encourage use of MyChart for appointments and healthcare management.   I have reviewed the patient's medical history (PMH, PSH, Social History, Family History, Medications, and allergies) , and have been updated if relevant. I spent 30 minutes reviewing chart and  face to face time with patient.    Return in about 3 months (around 03/24/2024) for With MMU.    Kirk RAMAN Mayers, PA-C

## 2023-12-24 ENCOUNTER — Other Ambulatory Visit: Payer: Self-pay

## 2023-12-24 ENCOUNTER — Other Ambulatory Visit (HOSPITAL_COMMUNITY): Payer: Self-pay

## 2023-12-24 LAB — CBC WITH DIFFERENTIAL/PLATELET
Basophils Absolute: 0.1 x10E3/uL (ref 0.0–0.2)
Basos: 1 %
EOS (ABSOLUTE): 0.1 x10E3/uL (ref 0.0–0.4)
Eos: 1 %
Hematocrit: 41.6 % (ref 34.0–46.6)
Hemoglobin: 12.8 g/dL (ref 11.1–15.9)
Immature Grans (Abs): 0 x10E3/uL (ref 0.0–0.1)
Immature Granulocytes: 0 %
Lymphocytes Absolute: 3.1 x10E3/uL (ref 0.7–3.1)
Lymphs: 23 %
MCH: 27.1 pg (ref 26.6–33.0)
MCHC: 30.8 g/dL — ABNORMAL LOW (ref 31.5–35.7)
MCV: 88 fL (ref 79–97)
Monocytes Absolute: 0.9 x10E3/uL (ref 0.1–0.9)
Monocytes: 7 %
Neutrophils Absolute: 9.3 x10E3/uL — ABNORMAL HIGH (ref 1.4–7.0)
Neutrophils: 68 %
Platelets: 385 x10E3/uL (ref 150–450)
RBC: 4.73 x10E6/uL (ref 3.77–5.28)
RDW: 14.8 % (ref 11.7–15.4)
WBC: 13.6 x10E3/uL — ABNORMAL HIGH (ref 3.4–10.8)

## 2023-12-24 LAB — LIPID PANEL
Chol/HDL Ratio: 2.8 ratio (ref 0.0–4.4)
Cholesterol, Total: 117 mg/dL (ref 100–199)
HDL: 42 mg/dL (ref >39)
LDL Chol Calc (NIH): 57 mg/dL (ref 0–99)
Triglycerides: 96 mg/dL (ref 0–149)
VLDL Cholesterol Cal: 18 mg/dL (ref 5–40)

## 2023-12-24 LAB — MICROALBUMIN / CREATININE URINE RATIO
Creatinine, Urine: 178.3 mg/dL
Microalb/Creat Ratio: 13 mg/g{creat} (ref 0–29)
Microalbumin, Urine: 23.2 ug/mL

## 2023-12-24 LAB — COMP. METABOLIC PANEL (12)
AST: 9 IU/L (ref 0–40)
Albumin: 4.5 g/dL (ref 3.9–4.9)
Alkaline Phosphatase: 67 IU/L (ref 44–121)
BUN/Creatinine Ratio: 17 (ref 9–23)
BUN: 11 mg/dL (ref 6–24)
Bilirubin Total: 0.3 mg/dL (ref 0.0–1.2)
Calcium: 9.5 mg/dL (ref 8.7–10.2)
Chloride: 103 mmol/L (ref 96–106)
Creatinine, Ser: 0.65 mg/dL (ref 0.57–1.00)
Globulin, Total: 2.5 g/dL (ref 1.5–4.5)
Glucose: 113 mg/dL — ABNORMAL HIGH (ref 70–99)
Potassium: 3.3 mmol/L — ABNORMAL LOW (ref 3.5–5.2)
Sodium: 142 mmol/L (ref 134–144)
Total Protein: 7 g/dL (ref 6.0–8.5)
eGFR: 112 mL/min/1.73 (ref >59)

## 2023-12-24 LAB — RPR: RPR Ser Ql: NONREACTIVE

## 2023-12-24 LAB — HIV ANTIBODY (ROUTINE TESTING W REFLEX): HIV Screen 4th Generation wRfx: NONREACTIVE

## 2023-12-27 ENCOUNTER — Ambulatory Visit: Payer: Self-pay | Admitting: Physician Assistant

## 2023-12-27 LAB — CERVICOVAGINAL ANCILLARY ONLY
Bacterial Vaginitis (gardnerella): POSITIVE — AB
Candida Glabrata: NEGATIVE
Candida Vaginitis: NEGATIVE
Chlamydia: NEGATIVE
Comment: NEGATIVE
Comment: NEGATIVE
Comment: NEGATIVE
Comment: NEGATIVE
Comment: NEGATIVE
Comment: NORMAL
Neisseria Gonorrhea: NEGATIVE
Trichomonas: NEGATIVE

## 2024-01-28 ENCOUNTER — Encounter: Payer: Self-pay | Admitting: Physician Assistant

## 2024-03-22 ENCOUNTER — Other Ambulatory Visit: Payer: Self-pay

## 2024-03-22 ENCOUNTER — Other Ambulatory Visit: Payer: Self-pay | Admitting: Physician Assistant

## 2024-03-22 ENCOUNTER — Other Ambulatory Visit (HOSPITAL_COMMUNITY): Payer: Self-pay

## 2024-03-22 DIAGNOSIS — I10 Essential (primary) hypertension: Secondary | ICD-10-CM

## 2024-03-22 DIAGNOSIS — E1169 Type 2 diabetes mellitus with other specified complication: Secondary | ICD-10-CM

## 2024-03-22 DIAGNOSIS — E1165 Type 2 diabetes mellitus with hyperglycemia: Secondary | ICD-10-CM

## 2024-03-22 MED ORDER — AMLODIPINE BESYLATE 10 MG PO TABS
10.0000 mg | ORAL_TABLET | Freq: Every day | ORAL | 0 refills | Status: DC
  Filled 2024-03-22: qty 90, 90d supply, fill #0

## 2024-03-22 MED ORDER — ROSUVASTATIN CALCIUM 20 MG PO TABS
20.0000 mg | ORAL_TABLET | Freq: Every day | ORAL | 0 refills | Status: DC
  Filled 2024-03-22: qty 90, 90d supply, fill #0

## 2024-03-22 MED ORDER — JANUMET 50-1000 MG PO TABS
1.0000 | ORAL_TABLET | Freq: Two times a day (BID) | ORAL | 0 refills | Status: DC
  Filled 2024-03-22: qty 180, 90d supply, fill #0

## 2024-03-22 MED ORDER — LISINOPRIL 20 MG PO TABS
20.0000 mg | ORAL_TABLET | Freq: Every day | ORAL | 0 refills | Status: DC
  Filled 2024-03-22: qty 90, 90d supply, fill #0

## 2024-03-23 ENCOUNTER — Other Ambulatory Visit: Payer: Self-pay

## 2024-03-27 ENCOUNTER — Telehealth: Payer: Self-pay | Admitting: Physician Assistant

## 2024-03-27 NOTE — Telephone Encounter (Signed)
 Contacted pt to schedule for follow up visit with [facility]. Pt stated she would try to come in on 10/30 if not she will be in for visit following week. Pt also requested a reminder call

## 2024-04-04 ENCOUNTER — Telehealth: Payer: Self-pay | Admitting: Physician Assistant

## 2024-04-04 NOTE — Telephone Encounter (Signed)
 Contacted pt to schedule follow up visit. Pt stated she would come in as a walk in on 11/06.

## 2024-06-15 ENCOUNTER — Other Ambulatory Visit (HOSPITAL_COMMUNITY): Payer: Self-pay

## 2024-06-15 ENCOUNTER — Encounter: Payer: Self-pay | Admitting: Physician Assistant

## 2024-06-15 ENCOUNTER — Ambulatory Visit: Admitting: Physician Assistant

## 2024-06-15 ENCOUNTER — Other Ambulatory Visit: Payer: Self-pay

## 2024-06-15 ENCOUNTER — Other Ambulatory Visit (HOSPITAL_COMMUNITY)
Admission: RE | Admit: 2024-06-15 | Discharge: 2024-06-15 | Disposition: A | Discharge status: Home / Self Care | Source: Ambulatory Visit | Attending: Physician Assistant | Admitting: Physician Assistant

## 2024-06-15 ENCOUNTER — Other Ambulatory Visit: Payer: Self-pay | Admitting: Nurse Practitioner

## 2024-06-15 VITALS — BP 138/99 | HR 99 | Ht 62.0 in | Wt 171.0 lb

## 2024-06-15 DIAGNOSIS — Z5320 Procedure and treatment not carried out because of patient's decision for unspecified reasons: Secondary | ICD-10-CM

## 2024-06-15 DIAGNOSIS — I10 Essential (primary) hypertension: Secondary | ICD-10-CM | POA: Diagnosis not present

## 2024-06-15 DIAGNOSIS — Z113 Encounter for screening for infections with a predominantly sexual mode of transmission: Secondary | ICD-10-CM

## 2024-06-15 DIAGNOSIS — E1165 Type 2 diabetes mellitus with hyperglycemia: Secondary | ICD-10-CM

## 2024-06-15 DIAGNOSIS — E785 Hyperlipidemia, unspecified: Secondary | ICD-10-CM

## 2024-06-15 DIAGNOSIS — Z7984 Long term (current) use of oral hypoglycemic drugs: Secondary | ICD-10-CM | POA: Diagnosis not present

## 2024-06-15 DIAGNOSIS — Z3009 Encounter for other general counseling and advice on contraception: Secondary | ICD-10-CM

## 2024-06-15 DIAGNOSIS — E1169 Type 2 diabetes mellitus with other specified complication: Secondary | ICD-10-CM

## 2024-06-15 DIAGNOSIS — J4520 Mild intermittent asthma, uncomplicated: Secondary | ICD-10-CM | POA: Diagnosis not present

## 2024-06-15 LAB — POCT GLYCOSYLATED HEMOGLOBIN (HGB A1C)
HbA1c POC (<> result, manual entry): 6.5 %
HbA1c, POC (controlled diabetic range): 6.5 % (ref 0.0–7.0)
HbA1c, POC (prediabetic range): 6.5 % — AB (ref 5.7–6.4)
Hemoglobin A1C: 6.5 % — AB (ref 4.0–5.6)

## 2024-06-15 MED ORDER — JANUMET 50-1000 MG PO TABS
1.0000 | ORAL_TABLET | Freq: Two times a day (BID) | ORAL | 0 refills | Status: AC
  Filled 2024-06-15 (×2): qty 180, 90d supply, fill #0

## 2024-06-15 MED ORDER — ROSUVASTATIN CALCIUM 20 MG PO TABS
20.0000 mg | ORAL_TABLET | Freq: Every day | ORAL | 1 refills | Status: AC
  Filled 2024-06-15 (×2): qty 90, 90d supply, fill #0

## 2024-06-15 MED ORDER — AMLODIPINE BESYLATE 10 MG PO TABS
10.0000 mg | ORAL_TABLET | Freq: Every day | ORAL | 1 refills | Status: AC
  Filled 2024-06-15 (×2): qty 90, 90d supply, fill #0

## 2024-06-15 MED ORDER — LISINOPRIL 20 MG PO TABS
20.0000 mg | ORAL_TABLET | Freq: Every day | ORAL | 1 refills | Status: AC
  Filled 2024-06-15 (×2): qty 90, 90d supply, fill #0

## 2024-06-15 MED ORDER — ALBUTEROL SULFATE HFA 108 (90 BASE) MCG/ACT IN AERS
1.0000 | INHALATION_SPRAY | Freq: Four times a day (QID) | RESPIRATORY_TRACT | 0 refills | Status: AC | PRN
  Filled 2024-06-15: qty 20.1, 75d supply, fill #0
  Filled 2024-06-15: qty 54, 75d supply, fill #0
  Filled 2024-06-15: qty 54, fill #0

## 2024-06-15 NOTE — Progress Notes (Signed)
 "  Established Patient Office Visit  Subjective   Patient ID: Samantha [name], female    DOB: Jan 24, 1981  Age: 44 y.o. MRN: [id number]  Chief Complaint  Patient presents with   Hypertension    Follow up on chronic health conditions and management  Discussed the use of AI scribe software for clinical note transcription with the patient, who gave verbal consent to proceed.  History of Present Illness   Samantha [name] is a 44 year old female with diabetes who presents for diabetes management and medication refills.  Her most recent A1c is 6.5. She is worried about increased carbohydrate intake over the holidays and uses instant oatmeal packs with added sugar for breakfast while trying to improve her diet.  She takes her diabetes medication twice daily and has an unopened full bottle at home. She also takes medications for cholesterol and blood pressure and has about half a bottle of blood pressure medication remaining. She does not monitor blood pressure at home.  She has lower pelvic pain and recent menstrual changes with darker blood and persistent cramps after menses. She denies itching or burning. She requests a vaginal swab and blood tests for HIV and syphilis. No known exposure.   She uses NuvaRing  for contraception and is considering another pregnancy before age 28. She wants to discuss family planning options.  She is a mother of seven boys, is in school, and plans to complete her education before having another child.     Past Medical History:  Diagnosis Date   Diabetes mellitus without complication (HCC)    Hyperlipidemia    Hypertension    Social History   Socioeconomic History   Marital status: [name]    Spouse name: Not on file   Number of children: Not on file   Years of education: Not on file   Highest education level: Not on file  Occupational History   Not on file  Tobacco Use   Smoking status: Every Day    Current packs/day: 0.50    Average packs/day:  0.5 packs/day for 0.4 years (0.2 ttl pk-yrs)    Types: Cigarettes   Smokeless tobacco: Never  Vaping Use   Vaping status: Never Used  Substance and Sexual Activity   Alcohol use: No   Drug use: No   Sexual activity: Yes    Birth control/protection: None  Other Topics Concern   Not on file  Social History Narrative   Not on file   Social Drivers of Health   Tobacco Use: High Risk (06/15/2024)   Patient History    Smoking Tobacco Use: Every Day    Smokeless Tobacco Use: Never    Passive Exposure: Not on file  Financial Resource Strain: Medium Risk (04/01/2023)   Overall Financial Resource Strain (CARDIA)    Difficulty of Paying Living Expenses: Somewhat hard  Food Insecurity: No Food Insecurity (06/15/2024)   Epic    Worried About Programme Researcher, Broadcasting/film/video in the Last Year: Never true    Ran Out of Food in the Last Year: Never true  Transportation Needs: No Transportation Needs (06/15/2024)   Epic    Lack of Transportation (Medical): No    Lack of Transportation (Non-Medical): No  Physical Activity: Inactive (04/01/2023)   Exercise Vital Sign    Days of Exercise per Week: 0 days    Minutes of Exercise per Session: 0 min  Stress: Stress Concern Present (04/01/2023)   [employer] of Occupational Health - Occupational Stress Questionnaire  Feeling of Stress : Rather much  Social Connections: Moderately Isolated (04/01/2023)   Social Connection and Isolation Panel    Frequency of Communication with Friends and Family: Once a week    Frequency of Social Gatherings with Friends and Family: Never    Attends Religious Services: More than 4 times per year    Active Member of [employer] or Organizations: Yes    Attends Banker Meetings: Never    Marital Status: Divorced  Catering Manager Violence: Not At Risk (06/15/2024)   Epic    Fear of Current or Ex-Partner: No    Emotionally Abused: No    Physically Abused: No    Sexually Abused: No  Depression (PHQ2-9): Low Risk  (06/15/2024)   Depression (PHQ2-9)    PHQ-2 Score: 0  Alcohol Screen: Low Risk (04/01/2023)   Alcohol Screen    Last Alcohol Screening Score (AUDIT): 3  Housing: Low Risk (06/15/2024)   Epic    Unable to Pay for Housing in the Last Year: No    Number of Times Moved in the Last Year: 0    Homeless in the Last Year: No  Utilities: Not At Risk (06/15/2024)   Epic    Threatened with loss of utilities: No  Health Literacy: Inadequate Health Literacy (04/01/2023)   B1300 Health Literacy    Frequency of need for help with medical instructions: Sometimes   Family History  Problem Relation Age of Onset   Heart disease Mother    Lung cancer Father    Diabetes type II Other    Allergies[1]  Review of Systems  Constitutional: Negative.   HENT: Negative.    Eyes: Negative.   Respiratory:  Negative for shortness of breath.   Cardiovascular:  Negative for chest pain.  Gastrointestinal: Negative.   Genitourinary:  Negative for dysuria.  Musculoskeletal: Negative.   Skin: Negative.   Neurological: Negative.   Endo/Heme/Allergies: Negative.   Psychiatric/Behavioral: Negative.        Objective:     BP (!) 138/99 (BP Location: Left Arm, Patient Position: Sitting, Cuff Size: Normal)   Pulse 99   Ht 5' 2 (1.575 m)   Wt 171 lb (77.6 kg)   SpO2 96%   BMI 31.28 kg/m  BP Readings from Last 3 Encounters:  06/15/24 (!) 138/99  12/23/23 (!) 144/94  07/15/23 (!) 138/90   Wt Readings from Last 3 Encounters:  06/15/24 171 lb (77.6 kg)  12/23/23 170 lb (77.1 kg)  07/15/23 177 lb 9.6 oz (80.6 kg)    Physical Exam Vitals and nursing note reviewed.    GENERAL: Alert, cooperative, well developed, no acute distress HEENT: Normocephalic, normal oropharynx, moist mucous membranes CHEST: Clear to auscultation bilaterally, no wheezes, rhonchi, or crackles CARDIOVASCULAR: Normal heart rate and rhythm, S1 and S2 normal without murmurs EXTREMITIES: No cyanosis or edema NEUROLOGICAL: Cranial  nerves grossly intact, moves all extremities without gross motor or sensory deficit   Assessment & Plan:   Problem List Items Addressed This Visit       Cardiovascular and Mediastinum   Essential hypertension   Relevant Medications   rosuvastatin  (CRESTOR ) 20 MG tablet   lisinopril  (ZESTRIL ) 20 MG tablet   amLODipine  (NORVASC ) 10 MG tablet     Endocrine   Type 2 diabetes mellitus with hyperglycemia (HCC) - Primary   Relevant Medications   sitaGLIPtin -metformin  (JANUMET ) 50-1000 MG tablet   rosuvastatin  (CRESTOR ) 20 MG tablet   lisinopril  (ZESTRIL ) 20 MG tablet   Other Relevant Orders  HgB A1c (Completed)   Hyperlipidemia associated with type 2 diabetes mellitus (HCC)   Relevant Medications   sitaGLIPtin -metformin  (JANUMET ) 50-1000 MG tablet   rosuvastatin  (CRESTOR ) 20 MG tablet   lisinopril  (ZESTRIL ) 20 MG tablet   amLODipine  (NORVASC ) 10 MG tablet   Other Visit Diagnoses       Mild intermittent asthma without complication       Relevant Medications   albuterol  (VENTOLIN  HFA) 108 (90 Base) MCG/ACT inhaler     Family planning initiation       Relevant Orders   Ambulatory referral to Gynecology     Screen for sexually transmitted diseases       Relevant Orders   Cervicovaginal ancillary only   HIV antibody (with reflex)   RPR     Screening mammography declined         1. Type 2 diabetes mellitus with hyperglycemia, without long-term current use of insulin  (HCC) (Primary) A1c improved from 7.0 to 6.5.  Continue current regimen.  Patient education given on low sugar diet.  Patient encouraged to continue checking blood sugars at home, keeping a written log and having available for all office visits.  Patient to follow-up with primary care provider or return to the mobile unit in 6 months. - HgB A1c - sitaGLIPtin -metformin  (JANUMET ) 50-1000 MG tablet; Take 1 tablet by mouth 2 (two) times daily with a meal.  Dispense: 180 tablet; Refill: 0  2. Essential  hypertension Continue current regimen.  Blood pressure slightly elevated, encouraged to check blood pressure at home, keep a written log and have available for all office visits.  Red flags given for prompt reevaluation - lisinopril  (ZESTRIL ) 20 MG tablet; Take 1 tablet (20 mg total) by mouth daily.  Dispense: 90 tablet; Refill: 1 - amLODipine  (NORVASC ) 10 MG tablet; Take 1 tablet (10 mg total) by mouth daily.  Dispense: 90 tablet; Refill: 1  3. Hyperlipidemia associated with type 2 diabetes mellitus (HCC) Continue current regimen - rosuvastatin  (CRESTOR ) 20 MG tablet; Take 1 tablet (20 mg total) by mouth daily.  Dispense: 90 tablet; Refill: 1  4. Mild intermittent asthma without complication Continue current regimen - albuterol  (VENTOLIN  HFA) 108 (90 Base) MCG/ACT inhaler; Inhale 1-2 puffs into the lungs every 6 (six) hours as needed for wheezing or shortness of breath.  Dispense: 54 g; Refill: 0  5. Family planning initiation  - Ambulatory referral to Gynecology  6. Screen for sexually transmitted diseases  - Cervicovaginal ancillary only - HIV antibody (with reflex) - RPR  7.  Screening mammography declined   I have reviewed the patient's medical history (PMH, PSH, Social History, Family History, Medications, and allergies) , and have been updated if relevant. I spent 30 minutes reviewing chart and  face to face time with patient.    Return in about 6 months (around 12/13/2024) for With MMU.    Zaley Talley S Mayers, PA-C     [1] No Known Allergies  "

## 2024-06-15 NOTE — Patient Instructions (Addendum)
 VISIT SUMMARY:  Today, we discussed your diabetes management, medication refills, and several other health concerns. Your A1c has improved to 6.5%, but we aim to reduce it further. We also addressed your blood pressure, family planning, and screening for sexually transmitted infections.  YOUR PLAN:  -TYPE 2 DIABETES MELLITUS: Type 2 diabetes is a condition where your body does not use insulin  properly, leading to high blood sugar levels. Your A1c has improved to 6.5%, but we aim to reduce it further. To help with this, try reducing your sugar intake at breakfast by switching to less sugary oatmeal options or incorporating protein-rich foods like eggs and cheese. Consider overnight oats with added fruits for flavor without added sugar. Continue to monitor your blood sugar levels and dietary habits.  -HYPERLIPIDEMIA ASSOCIATED WITH TYPE 2 DIABETES MELLITUS: Hyperlipidemia is a condition where there are high levels of fats (lipids) in your blood, which is often associated with diabetes. Your cholesterol levels are stable, so no re-evaluation is needed at this time.  -ESSENTIAL HYPERTENSION: Essential hypertension is high blood pressure with no identifiable cause. Your blood pressure is slightly elevated at 138/99 mmHg. It is important to monitor your blood pressure regularly at home, so please purchase a home blood pressure cuff. Continue taking your current blood pressure medications.  -FAMILY PLANNING AND CONTRACEPTION COUNSELING: We discussed your interest in having another pregnancy before age 24 and your current use of NuvaRing  for contraception. You have been referred to gynecology for pre-conception counseling and evaluation.   -SCREENING FOR SEXUALLY TRANSMITTED INFECTIONS: We addressed your concerns about recent symptoms and requested screening for HIV and syphilis. Blood tests for HIV and syphilis have been ordered.   How to Take Your Blood Pressure Blood pressure is a measurement of how  strongly your blood is pressing against the walls of your arteries. Arteries are blood vessels that carry blood from your heart throughout your body. Your health care provider takes your blood pressure at each office visit. You can also take your own blood pressure at home with a blood pressure monitor. You may need to take your own blood pressure to: Confirm a diagnosis of high blood pressure (hypertension). Monitor your blood pressure over time. Make sure your blood pressure medicine is working. Supplies needed: Blood pressure monitor. A chair to sit in. This should be a chair where you can sit upright with your back supported. Do not sit on a soft couch or an armchair. Table or desk. Small notebook and pencil or pen. How to prepare To get the most accurate reading, avoid the following for 30 minutes before you check your blood pressure: Drinking caffeine. Drinking alcohol. Eating. Smoking. Exercising. Five minutes before you check your blood pressure: Use the bathroom and urinate so that you have an empty bladder. Sit quietly in a chair. Do not talk. How to take your blood pressure To check your blood pressure, follow the instructions in the manual that came with your blood pressure monitor. If you have a digital blood pressure monitor, the instructions may be as follows: Sit up straight in a chair. Place your feet on the floor. Do not cross your ankles or legs. Rest your left arm at the level of your heart on a table or desk or on the arm of a chair. Pull up your shirt sleeve. Wrap the blood pressure cuff around the upper part of your left arm, 1 inch (2.5 cm) above your elbow. It is best to wrap the cuff around bare skin. Fit  the cuff snugly, but not too tightly, around your arm. You should be able to place only one finger between the cuff and your arm. Position the cord so that it rests in the bend of your elbow. Press the power button. Sit quietly while the cuff inflates and  deflates. Read the digital reading on the monitor screen and write the numbers down (record them) in a notebook. Wait 2-3 minutes, then repeat the steps, starting at step 1. What does my blood pressure reading mean? A blood pressure reading consists of a higher number over a lower number. Ideally, your blood pressure should be below 120/80. The first (top) number is called the systolic pressure. It is a measure of the pressure in your arteries as your heart beats. The second (bottom) number is called the diastolic pressure. It is a measure of the pressure in your arteries as the heart relaxes. Blood pressure is classified into four stages. The following are the stages for adults who do not have a short-term serious illness or a chronic condition. Systolic pressure and diastolic pressure are measured in a unit called mm Hg (millimeters of mercury).  Normal Systolic pressure: below 120. Diastolic pressure: below 80. Elevated Systolic pressure: 120-129. Diastolic pressure: below 80. Hypertension stage 1 Systolic pressure: 130-139. Diastolic pressure: 80-89. Hypertension stage 2 Systolic pressure: 140 or above. Diastolic pressure: 90 or above. You can have elevated blood pressure or hypertension even if only the systolic or only the diastolic number in your reading is higher than normal. Follow these instructions at home: Medicines Take over-the-counter and prescription medicines only as told by your health care provider. Tell your health care provider if you are having any side effects from blood pressure medicine. General instructions Check your blood pressure as often as recommended by your health care provider. Check your blood pressure at the same time every day. Take your monitor to the next appointment with your health care provider to make sure that: You are using it correctly. It provides accurate readings. Understand what your goal blood pressure numbers are. Keep all  follow-up visits. This is important. General tips Your health care provider can suggest a reliable monitor that will meet your needs. There are several types of home blood pressure monitors. Choose a monitor that has an arm cuff. Do not choose a monitor that measures your blood pressure from your wrist or finger. Choose a cuff that wraps snugly, not too tight or too loose, around your upper arm. You should be able to fit only one finger between your arm and the cuff. You can buy a blood pressure monitor at most drugstores or online. Where to find more information American [employer] Association: www.heart.org Contact a health care provider if: Your blood pressure is consistently high. Your blood pressure is suddenly low. Get help right away if: Your systolic blood pressure is higher than 180. Your diastolic blood pressure is higher than 120. These symptoms may be an emergency. Get help right away. Call 911. Do not wait to see if the symptoms will go away. Do not drive yourself to the hospital. Summary Blood pressure is a measurement of how strongly your blood is pressing against the walls of your arteries. A blood pressure reading consists of a higher number over a lower number. Ideally, your blood pressure should be below 120/80. Check your blood pressure at the same time every day. Avoid caffeine, alcohol, smoking, and exercise for 30 minutes prior to checking your blood pressure. These agents can affect  the accuracy of the blood pressure reading. This information is not intended to replace advice given to you by your health care provider. Make sure you discuss any questions you have with your health care provider. Document Revised: 01/30/2021 Document Reviewed: 01/30/2021 Elsevier Patient Education  2024 [employer].

## 2024-06-16 ENCOUNTER — Encounter: Payer: Self-pay | Admitting: Nurse Practitioner

## 2024-06-16 ENCOUNTER — Other Ambulatory Visit: Payer: Self-pay

## 2024-06-16 LAB — CERVICOVAGINAL ANCILLARY ONLY
Bacterial Vaginitis (gardnerella): NEGATIVE
Candida Glabrata: NEGATIVE
Candida Vaginitis: POSITIVE — AB
Chlamydia: NEGATIVE
Comment: NEGATIVE
Comment: NEGATIVE
Comment: NEGATIVE
Comment: NEGATIVE
Comment: NEGATIVE
Comment: NORMAL
Neisseria Gonorrhea: NEGATIVE
Trichomonas: NEGATIVE

## 2024-06-16 LAB — HIV ANTIBODY (ROUTINE TESTING W REFLEX): HIV Screen 4th Generation wRfx: NONREACTIVE

## 2024-06-16 LAB — SYPHILIS: RPR W/REFLEX TO RPR TITER AND TREPONEMAL ANTIBODIES, TRADITIONAL SCREENING AND DIAGNOSIS ALGORITHM: RPR Ser Ql: NONREACTIVE

## 2024-06-17 MED ORDER — ETONOGESTREL-ETHINYL ESTRADIOL 0.12-0.015 MG/24HR VA RING
VAGINAL_RING | VAGINAL | 1 refills | Status: AC
  Filled 2024-06-17: qty 3, 84d supply, fill #0

## 2024-06-18 ENCOUNTER — Other Ambulatory Visit (HOSPITAL_COMMUNITY): Payer: Self-pay

## 2024-06-19 ENCOUNTER — Ambulatory Visit: Payer: Self-pay | Admitting: Physician Assistant

## 2024-06-19 ENCOUNTER — Other Ambulatory Visit: Payer: Self-pay

## 2024-06-19 ENCOUNTER — Other Ambulatory Visit (HOSPITAL_COMMUNITY): Payer: Self-pay

## 2024-06-19 DIAGNOSIS — B3731 Acute candidiasis of vulva and vagina: Secondary | ICD-10-CM

## 2024-06-19 MED ORDER — FLUCONAZOLE 150 MG PO TABS
150.0000 mg | ORAL_TABLET | Freq: Once | ORAL | 0 refills | Status: AC
  Filled 2024-06-19 (×2): qty 1, 1d supply, fill #0

## 2024-06-19 NOTE — Telephone Encounter (Signed)
Thank you dear

## 2024-06-20 NOTE — Progress Notes (Signed)
 Pt is aware of lab results and provider recommendations
